# Patient Record
Sex: Female | Born: 1995 | Race: Black or African American | Hispanic: No | Marital: Single | Smoking: Never smoker
Health system: Southern US, Community
[De-identification: ages and names within clinical notes are randomized; demographics above are authoritative.]

## PROBLEM LIST (undated history)

## (undated) ENCOUNTER — Inpatient Hospital Stay (HOSPITAL_COMMUNITY): Payer: Self-pay

## (undated) ENCOUNTER — Emergency Department (HOSPITAL_COMMUNITY): Discharge status: Absent without leave | Payer: Medicaid Other

## (undated) DIAGNOSIS — A599 Trichomoniasis, unspecified: Secondary | ICD-10-CM

## (undated) DIAGNOSIS — R87619 Unspecified abnormal cytological findings in specimens from cervix uteri: Secondary | ICD-10-CM

## (undated) DIAGNOSIS — A749 Chlamydial infection, unspecified: Secondary | ICD-10-CM

## (undated) DIAGNOSIS — N390 Urinary tract infection, site not specified: Secondary | ICD-10-CM

## (undated) DIAGNOSIS — N946 Dysmenorrhea, unspecified: Secondary | ICD-10-CM

## (undated) HISTORY — PX: FOOT SURGERY: SHX648

## (undated) HISTORY — DX: Dysmenorrhea, unspecified: N94.6

## (undated) HISTORY — DX: Unspecified abnormal cytological findings in specimens from cervix uteri: R87.619

---
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
## 2016-02-07 ENCOUNTER — Emergency Department (HOSPITAL_COMMUNITY)
Admission: EM | Admit: 2016-02-07 | Discharge: 2016-02-07 | Disposition: A | Discharge status: Home / Self Care | Payer: Medicaid Other | Attending: Emergency Medicine | Admitting: Emergency Medicine

## 2016-02-07 DIAGNOSIS — N939 Abnormal uterine and vaginal bleeding, unspecified: Secondary | ICD-10-CM

## 2016-02-07 DIAGNOSIS — N938 Other specified abnormal uterine and vaginal bleeding: Secondary | ICD-10-CM

## 2016-02-07 DIAGNOSIS — Z79899 Other long term (current) drug therapy: Secondary | ICD-10-CM | POA: Insufficient documentation

## 2016-02-07 LAB — CBC WITH DIFFERENTIAL/PLATELET
BASOS ABS: 0 10*3/uL (ref 0.0–0.1)
BASOS PCT: 0 %
EOS ABS: 0.1 10*3/uL (ref 0.0–0.7)
Eosinophils Relative: 2 %
HEMATOCRIT: 38 % (ref 36.0–46.0)
HEMOGLOBIN: 12.1 g/dL (ref 12.0–15.0)
Lymphocytes Relative: 34 %
Lymphs Abs: 2.2 10*3/uL (ref 0.7–4.0)
MCH: 29.1 pg (ref 26.0–34.0)
MCHC: 31.8 g/dL (ref 30.0–36.0)
MCV: 91.3 fL (ref 78.0–100.0)
Monocytes Absolute: 0.4 10*3/uL (ref 0.1–1.0)
Monocytes Relative: 6 %
NEUTROS ABS: 3.8 10*3/uL (ref 1.7–7.7)
NEUTROS PCT: 58 %
Platelets: 296 10*3/uL (ref 150–400)
RBC: 4.16 MIL/uL (ref 3.87–5.11)
RDW: 13.4 % (ref 11.5–15.5)
WBC: 6.5 10*3/uL (ref 4.0–10.5)

## 2016-02-07 LAB — BASIC METABOLIC PANEL
ANION GAP: 5 (ref 5–15)
BUN: 12 mg/dL (ref 6–20)
CHLORIDE: 108 mmol/L (ref 101–111)
CO2: 24 mmol/L (ref 22–32)
CREATININE: 0.71 mg/dL (ref 0.44–1.00)
Calcium: 9.4 mg/dL (ref 8.9–10.3)
GFR calc non Af Amer: >60 mL/min (ref >60)
Glucose, Bld: 98 mg/dL (ref 65–99)
Potassium: 4.1 mmol/L (ref 3.5–5.1)
SODIUM: 137 mmol/L (ref 135–145)

## 2016-02-07 LAB — I-STAT BETA HCG BLOOD, ED (MC, WL, AP ONLY)

## 2016-02-07 MED ORDER — NAPROXEN 250 MG PO TABS
500.0000 mg | ORAL_TABLET | Freq: Once | ORAL | Status: AC
  Administered 2016-02-07: 500 mg via ORAL
  Filled 2016-02-07: qty 2

## 2016-02-07 MED ORDER — NAPROXEN 500 MG PO TABS: 500.0000 mg | ORAL_TABLET | Freq: Two times a day (BID) | ORAL | 0 refills | Status: DC

## 2016-02-07 NOTE — ED Provider Notes (Signed)
[facility] DEPT Provider Note   CSN: [id number] Arrival date & time: 02/07/16  [id number]  By signing my name below, I, Neta Mends, attest that this documentation has been prepared under the direction and in the presence of Tanna Furry, MD . Electronically Signed: Neta Mends, ED Scribe. 02/07/2016. 12:48 PM.    History   Chief Complaint Chief Complaint  Patient presents with  . Vaginal Bleeding   The history is provided by the patient. No language interpreter was used.   HPI Comments:  Heather Durham is a 21 y.o. female who presents to the Emergency Department complaining of persistent, heavy vaginal bleeding x 3 days. Pt reports that she had a depo-provera shot in august 2017 and has not had one since, and has not taken birth control/hormones. Pt was on depo for 2 years. She reports having heavy periods in October 2017 with associated cramping and has had the same symptoms every month since. She states that she used >10 pads every day. No alleviating factors noted. Pt denies other associated symptoms.    No past medical history on file.  There are no active problems to display for this patient.   Past Surgical History:  Procedure Laterality Date  . FOOT SURGERY Right     OB History    No data available       Home Medications    Prior to Admission medications   Medication Sig Start Date End Date Taking? Authorizing Provider  cetirizine (ZYRTEC) 10 MG chewable tablet Chew 1 tablet (10 mg total) by mouth daily. 02/21/15   Olen Cordial, NP  estradiol cypionate (DEPO-ESTRADIOL) 5 MG/ML injection Inject into the muscle every 28 (twenty-eight) days.    Historical Provider, MD  fluticasone (FLONASE) 50 MCG/ACT nasal spray Place 1 spray into both nostrils 2 (two) times daily. 02/21/15   Olen Cordial, NP  ibuprofen (ADVIL,MOTRIN) 200 MG tablet Take 200 mg by mouth every 6 (six) hours as needed.    Historical Provider, MD  naproxen (NAPROSYN) 500 MG tablet  Take 1 tablet (500 mg total) by mouth 2 (two) times daily. 02/07/16   Tanna Furry, MD  ondansetron (ZOFRAN ODT) 8 MG disintegrating tablet Take 1 tablet (8 mg total) by mouth 2 (two) times daily as needed for nausea or vomiting. 02/21/15   Olen Cordial, NP  penicillin v potassium (VEETID) 500 MG tablet Take 1 tablet (500 mg total) by mouth 2 (two) times daily. 02/22/15   Sherlene Shams, MD  predniSONE (DELTASONE) 20 MG tablet Take 2 tablets (40 mg total) by mouth daily with breakfast. 02/21/15   Olen Cordial, NP  sodium chloride (OCEAN) 0.65 % SOLN nasal spray Place 2 sprays into both nostrils every 2 (two) hours while awake. 02/21/15   Olen Cordial, NP    Family History No family history on file.  Social History Social History  Substance Use Topics  . Smoking status: Never Smoker  . Smokeless tobacco: Never Used  . Alcohol use No     Allergies   Patient has no known allergies.   Review of Systems Review of Systems  Constitutional: Negative for appetite change, chills, diaphoresis, fatigue and fever.  HENT: Negative for mouth sores, sore throat and trouble swallowing.   Eyes: Negative for visual disturbance.  Respiratory: Negative for cough, chest tightness, shortness of breath and wheezing.   Cardiovascular: Negative for chest pain.  Gastrointestinal: Negative for abdominal distention, abdominal pain, diarrhea, nausea and vomiting.  Endocrine:  Negative for polydipsia, polyphagia and polyuria.  Genitourinary: Positive for pelvic pain and vaginal bleeding. Negative for dysuria, frequency and hematuria.  Musculoskeletal: Negative for gait problem.  Skin: Negative for color change, pallor and rash.  Neurological: Negative for dizziness, syncope, light-headedness and headaches.  Hematological: Does not bruise/bleed easily.  Psychiatric/Behavioral: Negative for behavioral problems and confusion.  All other systems reviewed and are negative.    Physical Exam Updated Vital  Signs BP 118/64 (BP Location: Right Arm)   Pulse 74   Temp 98.5 F (36.9 C) (Oral)   Resp 20   SpO2 100%   Physical Exam  Constitutional: She is oriented to person, place, and time. She appears well-developed and well-nourished. No distress.  HENT:  Head: Normocephalic.  Eyes: Conjunctivae are normal. Pupils are equal, round, and reactive to light. No scleral icterus.  Neck: Normal range of motion. Neck supple. No thyromegaly present.  Cardiovascular: Normal rate and regular rhythm.  Exam reveals no gallop and no friction rub.   No murmur heard. Pulmonary/Chest: Effort normal and breath sounds normal. No respiratory distress. She has no wheezes. She has no rales.  Abdominal: Soft. Bowel sounds are normal. She exhibits no distension. There is no tenderness. There is no rebound.  Musculoskeletal: Normal range of motion.  Neurological: She is alert and oriented to person, place, and time.  Skin: Skin is warm and dry. No rash noted.  Psychiatric: She has a normal mood and affect. Her behavior is normal.     ED Treatments / Results  DIAGNOSTIC STUDIES:  Oxygen Saturation is 100% on RA, normal by my interpretation.    COORDINATION OF CARE:  12:47 PM Recommended to pt to begin seeing an OBGYN. Discussed treatment plan with pt at bedside and pt agreed to plan.   Labs (all labs ordered are listed, but only abnormal results are displayed) Labs Reviewed  CBC WITH DIFFERENTIAL/PLATELET  BASIC METABOLIC PANEL  I-STAT BETA HCG BLOOD, ED ([facility], [facility], [facility] ONLY)    EKG  EKG Interpretation None       Radiology No results found.  Procedures Procedures (including critical care time)  Medications Ordered in ED Medications  naproxen (NAPROSYN) tablet 500 mg (500 mg Oral Given 02/07/16 1254)     Initial Impression / Assessment and Plan / ED Course  I have reviewed the triage vital signs and the nursing notes.  Pertinent labs & imaging results that were available during my care of  the patient were reviewed by me and considered in my medical decision making (see chart for details).       Final Clinical Impressions(s) / ED Diagnoses   Final diagnoses:  Vaginal bleeding  Dysfunctional uterine bleeding   Stool hemoglobin. Not pregnant. No indications for additional testing at this time. GYN referral.  New Prescriptions Discharge Medication List as of 02/07/2016 12:50 PM    START taking these medications   Details  naproxen (NAPROSYN) 500 MG tablet Take 1 tablet (500 mg total) by mouth 2 (two) times daily., Starting Tue 02/07/2016, Print          Tanna Furry, MD 02/16/16 1434

## 2016-02-07 NOTE — Discharge Instructions (Signed)
You are not anemic. Neurapraxia test is negative. Your bleeding is not life-threatening.  You need to see a GYN to discuss treatment options for your bleeding including no treatment, or possible hormone treatment.  Call the number above to schedule an appointment at the [facility] hospital clinic. It is not necessary to go to the emergency room at [facility] hospital.  Naproxen as needed for uterine cramps

## 2016-02-07 NOTE — ED Triage Notes (Addendum)
Heavy vag bleeding since  Sat, states she came off depo shot in august and never went back to  Get another, she has  Issues with her period  Being very heavy since coming off, has had cramping

## 2016-06-19 ENCOUNTER — Emergency Department (HOSPITAL_COMMUNITY)
Admission: EM | Admit: 2016-06-19 | Discharge: 2016-06-20 | Disposition: A | Discharge status: Home / Self Care | Payer: Medicaid Other | Attending: Emergency Medicine | Admitting: Emergency Medicine

## 2016-06-19 ENCOUNTER — Encounter (HOSPITAL_COMMUNITY): Payer: Self-pay | Admitting: Nurse Practitioner

## 2016-06-19 DIAGNOSIS — Y929 Unspecified place or not applicable: Secondary | ICD-10-CM | POA: Insufficient documentation

## 2016-06-19 DIAGNOSIS — Z23 Encounter for immunization: Secondary | ICD-10-CM | POA: Insufficient documentation

## 2016-06-19 DIAGNOSIS — Z79899 Other long term (current) drug therapy: Secondary | ICD-10-CM | POA: Insufficient documentation

## 2016-06-19 DIAGNOSIS — Y9389 Activity, other specified: Secondary | ICD-10-CM | POA: Insufficient documentation

## 2016-06-19 DIAGNOSIS — W260XXA Contact with knife, initial encounter: Secondary | ICD-10-CM | POA: Insufficient documentation

## 2016-06-19 DIAGNOSIS — Y999 Unspecified external cause status: Secondary | ICD-10-CM | POA: Insufficient documentation

## 2016-06-19 DIAGNOSIS — S61211A Laceration without foreign body of left index finger without damage to nail, initial encounter: Secondary | ICD-10-CM | POA: Insufficient documentation

## 2016-06-19 MED ORDER — LIDOCAINE HCL 2 % IJ SOLN
5.0000 mL | Freq: Once | INTRAMUSCULAR | Status: AC
  Administered 2016-06-19: 100 mg
  Filled 2016-06-19: qty 20

## 2016-06-19 MED ORDER — IBUPROFEN 800 MG PO TABS
800.0000 mg | ORAL_TABLET | Freq: Once | ORAL | Status: AC
  Administered 2016-06-19: 800 mg via ORAL
  Filled 2016-06-19: qty 1

## 2016-06-19 MED ORDER — TETANUS-DIPHTH-ACELL PERTUSSIS 5-2.5-18.5 LF-MCG/0.5 IM SUSP
0.5000 mL | Freq: Once | INTRAMUSCULAR | Status: AC
  Administered 2016-06-19: 0.5 mL via INTRAMUSCULAR
  Filled 2016-06-19: qty 0.5

## 2016-06-19 NOTE — ED Provider Notes (Signed)
[facility] DEPT Provider Note   CSN: [id number] Arrival date & time: 06/19/16  1842  By signing my name below, I, Ephriam Jenkins, attest that this documentation has been prepared under the direction and in the presence of Aanchal Cope PA-C.  Electronically Signed: Ephriam Jenkins, ED Scribe. 06/19/16. 11:51 PM.  History   Chief Complaint Chief Complaint  Patient presents with  . Finger Injury    HPI HPI Comments: Heather Durham is a 21 y.o. female who presents to the Emergency Department s/p an injury that occurred around 1700 this evening. Pt was attempting to cut open a bag of Mikey Kirschner when the knife broke through the bag and cut her left second digit. The pt is right hand dominant. Pt has a small, hemostatic laceration on the distal portion of the finger. She rates her pain a 7/10 currently. The bleeding is currently controlled by a bandage. No OTC medications tried prior to arrival. She is unsure of her last Tetanus. No numbness or tingling.   The history is provided by the patient. No language interpreter was used.    History reviewed. No pertinent past medical history.  There are no active problems to display for this patient.  Past Surgical History:  Procedure Laterality Date  . FOOT SURGERY Right    OB History    No data available      Home Medications    Prior to Admission medications   Medication Sig Start Date End Date Taking? Authorizing Provider  cetirizine (ZYRTEC) 10 MG chewable tablet Chew 1 tablet (10 mg total) by mouth daily. 02/21/15   Betancourt, Aura Fey, NP  estradiol cypionate (DEPO-ESTRADIOL) 5 MG/ML injection Inject into the muscle every 28 (twenty-eight) days.    [provider]  fluticasone (FLONASE) 50 MCG/ACT nasal spray Place 1 spray into both nostrils 2 (two) times daily. 02/21/15   Betancourt, Aura Fey, NP  ibuprofen (ADVIL,MOTRIN) 200 MG tablet Take 200 mg by mouth every 6 (six) hours as needed.    [provider]  naproxen  (NAPROSYN) 500 MG tablet Take 1 tablet (500 mg total) by mouth 2 (two) times daily. 02/07/16   Tanna Furry, MD  ondansetron (ZOFRAN ODT) 8 MG disintegrating tablet Take 1 tablet (8 mg total) by mouth 2 (two) times daily as needed for nausea or vomiting. 02/21/15   Betancourt, Aura Fey, NP  penicillin v potassium (VEETID) 500 MG tablet Take 1 tablet (500 mg total) by mouth 2 (two) times daily. 02/22/15   Sherlene Shams, MD  predniSONE (DELTASONE) 20 MG tablet Take 2 tablets (40 mg total) by mouth daily with breakfast. 02/21/15   Betancourt, Aura Fey, NP  sodium chloride (OCEAN) 0.65 % SOLN nasal spray Place 2 sprays into both nostrils every 2 (two) hours while awake. 02/21/15   Betancourt, Aura Fey, NP   Family History History reviewed. No pertinent family history.  Social History Social History  Substance Use Topics  . Smoking status: Never Smoker  . Smokeless tobacco: Never Used  . Alcohol use No    Allergies   Patient has no known allergies.   Review of Systems Review of Systems  Constitutional: Negative for activity change.  Respiratory: Negative for shortness of breath.   Cardiovascular: Negative for chest pain.  Gastrointestinal: Negative for abdominal pain.  Musculoskeletal: Negative for back pain.  Skin: Positive for wound (laceration to left second digit). Negative for rash.  Neurological: Negative for numbness.    Physical Exam Updated Vital Signs BP 127/84 (  BP Location: Right Arm)   Pulse (!) 106   Temp 99.4 F (37.4 C) (Oral)   Resp 19   SpO2 98%   Physical Exam  Constitutional: No distress.  HENT:  Head: Normocephalic.  Eyes: Conjunctivae are normal.  Neck: Neck supple.  Cardiovascular: Normal rate, regular rhythm, normal heart sounds and intact distal pulses.  Exam reveals no gallop and no friction rub.   No murmur heard. Pulmonary/Chest: Effort normal and breath sounds normal. No respiratory distress. She has no rales. She exhibits no tenderness.  Abdominal: Soft.  She exhibits no distension.  Neurological: She is alert.  Skin: Skin is warm. No rash noted.  There is a 1 cm laceration distal to the DIP joint on the second digit of the left hand. NVI. Radial pulses 2+ FROM of all five digits and the left wrist.  Psychiatric: Her behavior is normal.  Nursing note and vitals reviewed.  ED Treatments / Results  DIAGNOSTIC STUDIES: Oxygen Saturation is 98% on RA, normal by my interpretation.  COORDINATION  OF CARE: 11:04 PM-Will suture hand. Discussed treatment plan with pt at bedside and pt agreed to plan.   Labs (all labs ordered are listed, but only abnormal results are displayed) Labs Reviewed - No data to display  EKG  EKG Interpretation None       Radiology No results found.  Procedures .Marland KitchenLaceration Repair Date/Time: 06/19/2016 11:54 PM Performed by: Sherryle Lis, Apolonia Ellwood A Authorized by: Joline Maxcy A   Consent:    Consent obtained:  Verbal   Consent given by:  Patient Laceration details:    Location:  Finger   Finger location:  L index finger   Length (cm):  1 Repair type:    Repair type:  Simple Pre-procedure details:    Preparation:  Patient was prepped and draped in usual sterile fashion and imaging obtained to evaluate for foreign bodies Exploration:    Hemostasis achieved with:  Direct pressure   Wound exploration: wound explored through full range of motion and entire depth of wound probed and visualized     Contaminated: no   Treatment:    Area cleansed with:  Saline   Irrigation solution:  Sterile saline   Irrigation method:  Pressure wash Skin repair:    Repair method:  Sutures   Suture size:  4-0   Suture material:  Prolene   Number of sutures:  2 Approximation:    Approximation:  Close   Vermilion border: well-aligned   Post-procedure details:    Dressing:  Splint for protection   Patient tolerance of procedure:  Tolerated well, no immediate complications    (including critical care time)  Medications  Ordered in ED Medications  lidocaine (XYLOCAINE) 2 % (with pres) injection 100 mg (100 mg Infiltration Given 06/19/16 2327)  Tdap (BOOSTRIX) injection 0.5 mL (0.5 mLs Intramuscular Given 06/19/16 2327)  ibuprofen (ADVIL,MOTRIN) tablet 800 mg (800 mg Oral Given 06/19/16 2327)     Initial Impression / Assessment and Plan / ED Course  I have reviewed the triage vital signs and the nursing notes.  Pertinent labs & imaging results that were available during my care of the patient were reviewed by me and considered in my medical decision making (see chart for details).     Pressure irrigation performed. Wound explored and base of wound visualized in a bloodless field without evidence of foreign body.  Laceration occurred < 8 hours prior to repair which was well tolerated. Tdap updated.  Pt has no comorbidities  to effect normal wound healing. Pt discharged without antibiotics.  Discussed suture home care with patient and answered questions. Pt to follow-up for wound check and suture removal in 7 days; they are to return to the ED sooner for signs of infection. Pt is hemodynamically stable with no complaints prior to dc.   Final Clinical Impressions(s) / ED Diagnoses   Final diagnoses:  Laceration of left index finger without foreign body without damage to nail, initial encounter    New Prescriptions New Prescriptions   No medications on file  I personally performed the services described in this documentation, which was scribed in my presence. The recorded information has been reviewed and is accurate.     Joanne Gavel, PA-C 06/20/16 Richrd Humbles, MD 06/25/16 [phone]

## 2016-06-19 NOTE — ED Triage Notes (Signed)
Pt presents with c/o finger injury. she has a laceration to anterior tip of Left second digit with knife while cutting a bag. Minimal bleeding. Pain at site.

## 2016-06-20 NOTE — Discharge Instructions (Signed)
Please wear your finger splint to avoid ripping your stitches. Your stitches can come out in 7 days. You can return to the emergency department or have your primary care provider take them out. Please keep the wound clean with warm soap and water. If he develops any additional symptoms such as fever, chills, warmth, redness, or severe swelling to the finger, please return to the emergency department for re-evaluation.

## 2016-06-25 ENCOUNTER — Encounter (HOSPITAL_COMMUNITY): Payer: Self-pay | Admitting: Emergency Medicine

## 2016-06-25 ENCOUNTER — Emergency Department (HOSPITAL_COMMUNITY)
Admission: EM | Admit: 2016-06-25 | Discharge: 2016-06-25 | Disposition: A | Discharge status: Home / Self Care | Payer: Medicaid Other | Attending: Emergency Medicine | Admitting: Emergency Medicine

## 2016-06-25 DIAGNOSIS — Z79899 Other long term (current) drug therapy: Secondary | ICD-10-CM | POA: Insufficient documentation

## 2016-06-25 DIAGNOSIS — Z4802 Encounter for removal of sutures: Secondary | ICD-10-CM

## 2016-06-25 NOTE — ED Notes (Signed)
Placed bacitracin and dressing on finger

## 2016-06-25 NOTE — ED Triage Notes (Signed)
Pt returns to have sutures removed from left index finger.  No complaints voiced

## 2016-06-25 NOTE — ED Provider Notes (Signed)
[facility] DEPT Provider Note   CSN: [id number] Arrival date & time: 06/25/16  1801  By signing my name below, I, Hansel Feinstein, attest that this documentation has been prepared under the direction and in the presence of  [place]. Janit Bern, NP. Electronically Signed: Hansel Feinstein, ED Scribe. 06/25/16. 6:58 PM.    History   Chief Complaint Chief Complaint  Patient presents with  . Suture / Staple Removal    HPI Heather Durham is a 21 y.o. female who presents to the Emergency Department requesting suture removal from the left index finger, initially placed on 06/19/16. Per chart review, pt sustained the injury cutting a bag with a kitchen knife and had 2 sutures placed. She states the wound has healed well and denies any drainage. She does note the area has been mildly painful with touch. She also denies fever.   The history is provided by the patient. No language interpreter was used.  Suture / Staple Removal  This is a new problem. The current episode started more than 1 week ago. The problem occurs constantly. The problem has been rapidly improving. Exacerbated by: touch. Treatments tried: sutures. The treatment provided significant relief.    History reviewed. No pertinent past medical history.  There are no active problems to display for this patient.   Past Surgical History:  Procedure Laterality Date  . FOOT SURGERY Right     OB History    No data available       Home Medications    Prior to Admission medications   Medication Sig Start Date End Date Taking? Authorizing Provider  cetirizine (ZYRTEC) 10 MG chewable tablet Chew 1 tablet (10 mg total) by mouth daily. 02/21/15   Betancourt, Aura Fey, NP  estradiol cypionate (DEPO-ESTRADIOL) 5 MG/ML injection Inject into the muscle every 28 (twenty-eight) days.    [provider]  fluticasone (FLONASE) 50 MCG/ACT nasal spray Place 1 spray into both nostrils 2 (two) times daily. 02/21/15   Betancourt, Aura Fey, NP    ibuprofen (ADVIL,MOTRIN) 200 MG tablet Take 200 mg by mouth every 6 (six) hours as needed.    [provider]  naproxen (NAPROSYN) 500 MG tablet Take 1 tablet (500 mg total) by mouth 2 (two) times daily. 02/07/16   Tanna Furry, MD  ondansetron (ZOFRAN ODT) 8 MG disintegrating tablet Take 1 tablet (8 mg total) by mouth 2 (two) times daily as needed for nausea or vomiting. 02/21/15   Betancourt, Aura Fey, NP  penicillin v potassium (VEETID) 500 MG tablet Take 1 tablet (500 mg total) by mouth 2 (two) times daily. 02/22/15   Sherlene Shams, MD  predniSONE (DELTASONE) 20 MG tablet Take 2 tablets (40 mg total) by mouth daily with breakfast. 02/21/15   Betancourt, Aura Fey, NP  sodium chloride (OCEAN) 0.65 % SOLN nasal spray Place 2 sprays into both nostrils every 2 (two) hours while awake. 02/21/15   Betancourt, Aura Fey, NP    Family History No family history on file.  Social History Social History  Substance Use Topics  . Smoking status: Never Smoker  . Smokeless tobacco: Never Used  . Alcohol use No     Allergies   Patient has no known allergies.   Review of Systems Review of Systems  Constitutional: Negative for fever.  Gastrointestinal: Negative for nausea.  Skin: Positive for wound.     Physical Exam Updated Vital Signs BP 120/66   Temp 98.4 F (36.9 C) (Oral)   Resp 17  Ht 5\' 8"  (1.727 m)   Wt 103.1 kg (227 lb 6 oz)   SpO2 98%   BMI 34.57 kg/m   Physical Exam  Constitutional: She appears well-developed and well-nourished. No distress.  HENT:  Head: Normocephalic.  Eyes: EOM are normal.  Neck: Neck supple.  Cardiovascular: Normal rate.   Pulmonary/Chest: Effort normal.  Musculoskeletal: Normal range of motion.  Neurological: She is alert.  Skin: Skin is warm and dry.  Healing wound to the left index finger palmar aspect. No redness, drainage or signs of infection. FROM of the finger. 2 sutures in place.   Psychiatric: She has a normal mood and affect. Her  behavior is normal.  Nursing note and vitals reviewed.    ED Treatments / Results   DIAGNOSTIC STUDIES: Oxygen Saturation is 98% on RA, normal by my interpretation.    COORDINATION OF CARE: 6:55 PM Discussed treatment plan with pt at bedside which includes suture removal and pt agreed to plan.   Procedures .Suture Removal Date/Time: 06/25/2016 6:54 PM Performed by: Ashley Murrain Authorized by: Ashley Murrain   Consent:    Consent obtained:  Verbal   Consent given by:  Patient   Risks discussed:  Bleeding, pain and wound separation   Alternatives discussed:  No treatment Universal protocol:    Procedure explained and questions answered to patient or proxy's satisfaction: yes     Immediately prior to procedure, a time out was called: yes     Patient identity confirmed:  Verbally with patient and arm band Location:    Location:  Upper extremity   Upper extremity location:  Hand   Hand location:  L index finger Procedure details:    Wound appearance:  No signs of infection, good wound healing and clean   Number of sutures removed:  2 Post-procedure details:    Post-removal:  Antibiotic ointment applied   Patient tolerance of procedure:  Tolerated well, no immediate complications   (including critical care time)  Medications Ordered in ED Medications - No data to display   Initial Impression / Assessment and Plan / ED Course  I have reviewed the triage vital signs and the nursing notes.   Suture removal   Pt to ER for suture removal and wound check as above. Procedure tolerated well. Vitals normal, no signs of infection. Scar minimization & return precautions given at dc.    Final Clinical Impressions(s) / ED Diagnoses   Final diagnoses:  Visit for suture removal    New Prescriptions New Prescriptions   No medications on file    I personally performed the services described in this documentation, which was scribed in my presence. The recorded information has  been reviewed and is accurate.     Debroah Baller [place], [place] 06/25/16 1919    Dorie Rank, MD 06/27/16 [phone]

## 2016-09-06 ENCOUNTER — Ambulatory Visit (HOSPITAL_COMMUNITY)
Admission: EM | Admit: 2016-09-06 | Discharge: 2016-09-06 | Disposition: A | Discharge status: Home / Self Care | Payer: Medicaid Other | Attending: Emergency Medicine | Admitting: Emergency Medicine

## 2016-09-06 ENCOUNTER — Encounter (HOSPITAL_COMMUNITY): Payer: Self-pay | Admitting: Nurse Practitioner

## 2016-09-06 DIAGNOSIS — O3680X0 Pregnancy with inconclusive fetal viability, not applicable or unspecified: Secondary | ICD-10-CM | POA: Insufficient documentation

## 2016-09-06 DIAGNOSIS — O26891 Other specified pregnancy related conditions, first trimester: Secondary | ICD-10-CM | POA: Insufficient documentation

## 2016-09-06 DIAGNOSIS — R110 Nausea: Secondary | ICD-10-CM | POA: Insufficient documentation

## 2016-09-06 DIAGNOSIS — Z3A01 Less than 8 weeks gestation of pregnancy: Secondary | ICD-10-CM | POA: Insufficient documentation

## 2016-09-06 DIAGNOSIS — O26899 Other specified pregnancy related conditions, unspecified trimester: Secondary | ICD-10-CM

## 2016-09-06 DIAGNOSIS — R319 Hematuria, unspecified: Secondary | ICD-10-CM

## 2016-09-06 DIAGNOSIS — R102 Pelvic and perineal pain: Secondary | ICD-10-CM | POA: Insufficient documentation

## 2016-09-06 DIAGNOSIS — Z3201 Encounter for pregnancy test, result positive: Secondary | ICD-10-CM

## 2016-09-06 DIAGNOSIS — N390 Urinary tract infection, site not specified: Secondary | ICD-10-CM

## 2016-09-06 DIAGNOSIS — O2341 Unspecified infection of urinary tract in pregnancy, first trimester: Secondary | ICD-10-CM | POA: Insufficient documentation

## 2016-09-06 LAB — POCT URINALYSIS DIP (DEVICE)
BILIRUBIN URINE: NEGATIVE
GLUCOSE, UA: NEGATIVE mg/dL
KETONES UR: NEGATIVE mg/dL
Nitrite: NEGATIVE
PROTEIN: 30 mg/dL — AB
Specific Gravity, Urine: >=1.03 (ref 1.005–1.030)
Urobilinogen, UA: 1 mg/dL (ref 0.0–1.0)
pH: 6.5 (ref 5.0–8.0)

## 2016-09-06 LAB — POCT PREGNANCY, URINE: PREG TEST UR: POSITIVE — AB

## 2016-09-06 MED ORDER — PRENATAL VITAMIN 27-0.8 MG PO TABS: 1.0000 | ORAL_TABLET | Freq: Every day | ORAL | 1 refills | Status: DC

## 2016-09-06 MED ORDER — NITROFURANTOIN MONOHYD MACRO 100 MG PO CAPS: 100.0000 mg | ORAL_CAPSULE | Freq: Two times a day (BID) | ORAL | 0 refills | Status: AC

## 2016-09-06 NOTE — ED Triage Notes (Signed)
Pt presents with c/o pelvic pain. The pain began about one week ago. The pain feels like menstrual cramps but she denies any vaginal bleeding. Her last normal menstrual period was on 7/12. She reports a history of irregular periods. She has not taken anything at home for the pain.

## 2016-09-06 NOTE — Discharge Instructions (Signed)
Finish the [employer]. Start the prenatal vitamins. Give [place] a working phone number so that we can contact you if we need to change any of prescriptions. Follow-up with anyone of the OB/GYNs listed above. Go immediately to [facility] on [facility] for any of the signs and symptoms that we discussed such as vaginal bleeding, regular cramping, pelvic pain on one side or the other, or if you pass out.

## 2016-09-06 NOTE — ED Provider Notes (Addendum)
HPI  SUBJECTIVE:  Heather Durham is a 21 y.o. female who presents with 1.5 weeks of irregular nonradiating nonmigratory low midline pelvic pain described as cramping. It lasts 10-15 seconds and then resolves on its own. She reports vaginal bleeding described as spotting 4 days ago, but this has since resolved. She reports nausea and sensitivity to smells. She denies vaginal odor, discharge, rash, itching. No vomiting, fevers, back pain. No breast tenderness. No dysuria, urgency, frequency, cloudy or odorous urine, hematuria. No dyspareunia. She has been using a heating pad with some improvement of symptoms. No aggravating factors. She has never had symptoms like this before. She is in a new monogamous relationship of 2 months with a female who is asymptomatic.  STDs are not a concern today. However, she states that she was treated 2 weeks ago for chlamydia from another partner with whom she has not had contact with since starting this new relationship. States that she was checked for gonorrhea, HIV, HSV, syphilis, Trichomonas, BV yeast and all these were negative. She has a past medical history of chlamydia. no history of gonorrhea, HIV, HSV, syphilis, Trichomonas, BV, yeast. She has never been pregnant before. No history of PID, diabetes, UTI, pyelonephritis. LMP: 7/12. She thinks that she may have had a positive home pregnancy test but is not sure. FIE:PPIRJJ, Ngwe A, MD       No past medical history on file.  Past Surgical History:  Procedure Laterality Date  . FOOT SURGERY Right     No family history on file.  Social History  Substance Use Topics  . Smoking status: Never Smoker  . Smokeless tobacco: Never Used  . Alcohol use No    No current facility-administered medications for this encounter.   Current Outpatient Prescriptions:  .  fluticasone (FLONASE) 50 MCG/ACT nasal spray, Place 1 spray into both nostrils 2 (two) times daily., Disp: 16 g, Rfl: 0 .  nitrofurantoin,  macrocrystal-monohydrate, (MACROBID) 100 MG capsule, Take 1 capsule (100 mg total) by mouth 2 (two) times daily., Disp: 14 capsule, Rfl: 0 .  Prenatal Vit-Fe Fumarate-FA (PRENATAL VITAMIN) 27-0.8 MG TABS, Take 1 tablet by mouth daily., Disp: 30 tablet, Rfl: 1 .  sodium chloride (OCEAN) 0.65 % SOLN nasal spray, Place 2 sprays into both nostrils every 2 (two) hours while awake., Disp: , Rfl: 0  No Known Allergies   ROS  As noted in HPI.   Physical Exam  BP 132/74   Pulse 89   Temp 98.6 F (37 C) (Oral)   Resp 16   SpO2 100%   Constitutional: Well developed, well nourished, no acute distress Eyes:  EOMI, conjunctiva normal bilaterally HENT: Normocephalic, atraumatic,mucus membranes moist Respiratory: Normal inspiratory effort Cardiovascular: Normal rate GI: nondistended soft, nontender. No suprapubic tenderness  back: No CVA tenderness GU: External genitalia normal.  Normal vaginal mucosa.  Normal os. Scant nonoderous  White vaginal discharge.   Uterus smooth,  NT. No  CMT. Noadnexal tenderness. No adnexal masses.  Chaperone present during exam skin: No rash, skin intact Musculoskeletal: no deformities Neurologic: Alert & oriented x 3, no focal neuro deficits Psychiatric: Speech and behavior appropriate   ED Course   Medications - No data to display  Orders Placed This Encounter  Procedures  . Pelvic exam    Standing Status:   Standing    Number of Occurrences:   1  . Urine culture    Standing Status:   Standing    Number of Occurrences:   1  Order Specific Question:   List patient's active antibiotics    Answer:   macrobid    Order Specific Question:   Patient immune status    Answer:   Normal  . POCT urinalysis dip (device)    Standing Status:   Standing    Number of Occurrences:   1  . Pregnancy, urine POC    Standing Status:   Standing    Number of Occurrences:   1    Results for orders placed or performed during the hospital encounter of 09/06/16 (from  the past 24 hour(s))  POCT urinalysis dip (device)     Status: Abnormal   Collection Time: 09/06/16  7:11 PM  Result Value Ref Range   Glucose, UA NEGATIVE NEGATIVE mg/dL   Bilirubin Urine NEGATIVE NEGATIVE   Ketones, ur NEGATIVE NEGATIVE mg/dL   Specific Gravity, Urine >=1.030 1.005 - 1.030   Hgb urine dipstick TRACE (A) NEGATIVE   pH 6.5 5.0 - 8.0   Protein, ur 30 (A) NEGATIVE mg/dL   Urobilinogen, UA 1.0 0.0 - 1.0 mg/dL   Nitrite NEGATIVE NEGATIVE   Leukocytes, UA LARGE (A) NEGATIVE  Pregnancy, urine POC     Status: Abnormal   Collection Time: 09/06/16  7:19 PM  Result Value Ref Range   Preg Test, Ur POSITIVE (A) NEGATIVE   No results found.  ED Clinical Impression  Pregnancy of unknown anatomic location  Pelvic pain during pregnancy  Urinary tract infection with hematuria, site unspecified   ED Assessment/Plan  Patient is pregnant. Estimated gestational age by LMP 6 weeks 0 days. Sending home with prenatal vitamins.   No evidence of PID. No evidence of ectopic pregnancy at this time.  She also has a UTI. Sending her home with Macrobid 7 days. Sending urine off for culture to confirm antibiotic choice.  She has no other urinary symptoms so deferring Pyridium. Discussed with her the possibility of treating empirically for gonorrhea and chlamydia here today, but she states that since she was recently treated for chlamydia she would rather wait for her labs. She is to give [place] a working phone number so that we can contact her if needed. Advised her that she will need to bring her partner in for treatment if her gonorrhea or chlamydia come back positive. Also, will wait for labs prior to initiating treatment for BV or yeast since she has no odor or discharge.  Pt provided working phone number. Follow-up with OB/GYN of choice for next steps. Providing comprehensive list of OB/GYN providers on call. as needed. Discussed labs, MDM, plan and followup with patient. discussed Signs and  symptoms that should prompt return to the emergency department at [facility]. Pt agrees with plan.   Meds ordered this encounter  Medications  . nitrofurantoin, macrocrystal-monohydrate, (MACROBID) 100 MG capsule    Sig: Take 1 capsule (100 mg total) by mouth 2 (two) times daily.    Dispense:  14 capsule    Refill:  0  . Prenatal Vit-Fe Fumarate-FA (PRENATAL VITAMIN) 27-0.8 MG TABS    Sig: Take 1 tablet by mouth daily.    Dispense:  30 tablet    Refill:  1    *This clinic note was created using L[employer]. Therefore, there may be occasional mistakes despite careful proofreading.  ?1633 09/07/2016-   after consulting with a midwife colleague, feel that the safest course of action would be to have pt go to MAU today to have labwork and possible u/s to confirm  pregnancy location.  Called pt. Pt doing better today and has no pelvic pain. Is taking the macrobid. Recommended that she go to MAU today for further workup and explained why. Pt agrees to go right now.     Melynda Ripple, MD 09/06/16 2123    Melynda Ripple, MD 09/06/16 2128    Melynda Ripple, MD 09/07/16 [phone]

## 2016-09-06 NOTE — ED Notes (Signed)
Called [facility] and requested they [facility] [place] [facility].  Caryl Pina at [facility] pharmacy agreed.  meds being prepared

## 2016-09-07 ENCOUNTER — Inpatient Hospital Stay (HOSPITAL_COMMUNITY): Payer: Self-pay

## 2016-09-07 ENCOUNTER — Inpatient Hospital Stay (HOSPITAL_COMMUNITY)
Admission: AD | Admit: 2016-09-07 | Discharge: 2016-09-07 | Disposition: A | Discharge status: Home / Self Care | Payer: Self-pay | Source: Ambulatory Visit | Attending: Obstetrics and Gynecology | Admitting: Obstetrics and Gynecology

## 2016-09-07 ENCOUNTER — Telehealth (HOSPITAL_COMMUNITY): Payer: Self-pay | Admitting: Internal Medicine

## 2016-09-07 ENCOUNTER — Encounter (HOSPITAL_COMMUNITY): Payer: Self-pay | Admitting: *Deleted

## 2016-09-07 DIAGNOSIS — R109 Unspecified abdominal pain: Secondary | ICD-10-CM | POA: Insufficient documentation

## 2016-09-07 DIAGNOSIS — Z9889 Other specified postprocedural states: Secondary | ICD-10-CM | POA: Insufficient documentation

## 2016-09-07 DIAGNOSIS — Z3201 Encounter for pregnancy test, result positive: Secondary | ICD-10-CM | POA: Insufficient documentation

## 2016-09-07 DIAGNOSIS — Z3A01 Less than 8 weeks gestation of pregnancy: Secondary | ICD-10-CM | POA: Insufficient documentation

## 2016-09-07 DIAGNOSIS — O208 Other hemorrhage in early pregnancy: Secondary | ICD-10-CM | POA: Insufficient documentation

## 2016-09-07 DIAGNOSIS — O26891 Other specified pregnancy related conditions, first trimester: Secondary | ICD-10-CM | POA: Insufficient documentation

## 2016-09-07 DIAGNOSIS — Z3491 Encounter for supervision of normal pregnancy, unspecified, first trimester: Secondary | ICD-10-CM

## 2016-09-07 DIAGNOSIS — O9989 Other specified diseases and conditions complicating pregnancy, childbirth and the puerperium: Secondary | ICD-10-CM

## 2016-09-07 LAB — URINALYSIS, ROUTINE W REFLEX MICROSCOPIC
Bilirubin Urine: NEGATIVE
Glucose, UA: NEGATIVE mg/dL
Hgb urine dipstick: NEGATIVE
Ketones, ur: NEGATIVE mg/dL
Nitrite: NEGATIVE
Protein, ur: NEGATIVE mg/dL
Specific Gravity, Urine: 1.019 (ref 1.005–1.030)
pH: 7 (ref 5.0–8.0)

## 2016-09-07 LAB — CERVICOVAGINAL ANCILLARY ONLY
Bacterial vaginitis: POSITIVE — AB
Candida vaginitis: NEGATIVE
Chlamydia: POSITIVE — AB
Neisseria Gonorrhea: NEGATIVE
Trichomonas: POSITIVE — AB

## 2016-09-07 LAB — ABO/RH: ABO/RH(D): O POS

## 2016-09-07 LAB — CBC
HCT: 33.9 % — ABNORMAL LOW (ref 36.0–46.0)
HEMOGLOBIN: 11.1 g/dL — AB (ref 12.0–15.0)
MCH: 29.8 pg (ref 26.0–34.0)
MCHC: 32.7 g/dL (ref 30.0–36.0)
MCV: 90.9 fL (ref 78.0–100.0)
Platelets: 287 10*3/uL (ref 150–400)
RBC: 3.73 MIL/uL — ABNORMAL LOW (ref 3.87–5.11)
RDW: 13.3 % (ref 11.5–15.5)
WBC: 7.3 10*3/uL (ref 4.0–10.5)

## 2016-09-07 LAB — HCG, QUANTITATIVE, PREGNANCY: hCG, Beta Chain, Quant, S: 15535 m[IU]/mL — ABNORMAL HIGH (ref <5)

## 2016-09-07 MED ORDER — ONDANSETRON HCL 4 MG PO TABS: 8.0000 mg | ORAL_TABLET | ORAL | 0 refills | Status: DC | PRN

## 2016-09-07 MED ORDER — METRONIDAZOLE 500 MG PO TABS: 2000.0000 mg | ORAL_TABLET | Freq: Once | ORAL | 0 refills | Status: AC

## 2016-09-07 MED ORDER — PROMETHAZINE HCL 25 MG PO TABS: 12.5000 mg | ORAL_TABLET | Freq: Four times a day (QID) | ORAL | 2 refills | Status: DC | PRN

## 2016-09-07 MED ORDER — AZITHROMYCIN 500 MG PO TABS: 1000.0000 mg | ORAL_TABLET | Freq: Once | ORAL | 0 refills | Status: AC

## 2016-09-07 NOTE — MAU Note (Signed)
Pt presents to MAU with complaints of lower abdominal pain for approximately a week, went to urgent care yesterday and was told she was pregnant and to follow up here if the pain continued.Denies any VB or abnormal discharge

## 2016-09-07 NOTE — Discharge Instructions (Signed)
[facility]iders  ° ° °[facility]       Phone: [phone] ° °[facility] Phone: [phone] ° °[facility]  Phone: [phone] ° °[facility] [place]  Phone: [phone] ° °[facility]  Phone: [phone] ° °[facility]       Phone: [phone] ° °[facility]    Phone: [phone]  ° °[facility]    Phone: [phone] ° °[facility]    Phone: [phone] ° °[facility]    Phone: [phone] ° °[facility]    Phone: [phone] ° °[facility]       Phone: [phone]  ° °[facility]  Phone: [phone] ° °[facility]    Phone: [phone] ° °[facility]   Phone: [phone] ° °[facility]      Phone: [phone] ° °[facility]    Phone: [phone] ° °

## 2016-09-07 NOTE — MAU Note (Signed)
Urine sent to lab 

## 2016-09-07 NOTE — MAU Provider Note (Signed)
Chief Complaint: Abdominal Pain   First Provider Initiated Contact with Patient 09/07/16 1830      SUBJECTIVE HPI: Heather Durham is a 21 y.o. G1P0 at [redacted]w[redacted]d by LMP who presents to maternity admissions reporting abdominal cramping x 2 days with positive pregnancy test at urgent care yesterday. She reports the pain is sharp, intermittent, on her right and left lower sides and unchanged in intensity since onset.  She has not tried any treatments. There are no other associated symptoms. She denies vaginal bleeding, vaginal itching/burning, urinary symptoms, h/a, dizziness, n/v, or fever/chills.     HPI  No past medical history on file. Past Surgical History:  Procedure Laterality Date  . FOOT SURGERY Right    Social History   Social History  . Marital status: Single    Spouse name: N/A  . Number of children: N/A  . Years of education: N/A   Occupational History  . Not on file.   Social History Main Topics  . Smoking status: Never Smoker  . Smokeless tobacco: Never Used  . Alcohol use No  . Drug use: No  . Sexual activity: Yes    Birth control/ protection: None   Other Topics Concern  . Not on file   Social History Narrative  . No narrative on file   No current facility-administered medications on file prior to encounter.    Current Outpatient Prescriptions on File Prior to Encounter  Medication Sig Dispense Refill  . nitrofurantoin, macrocrystal-monohydrate, (MACROBID) 100 MG capsule Take 1 capsule (100 mg total) by mouth 2 (two) times daily. 14 capsule 0  . Prenatal Vit-Fe Fumarate-FA (PRENATAL VITAMIN) 27-0.8 MG TABS Take 1 tablet by mouth daily. 30 tablet 1  . azithromycin (ZITHROMAX) 500 MG tablet Take 2 tablets (1,000 mg total) by mouth once. (Patient not taking: Reported on 09/07/2016) 2 tablet 0  . metroNIDAZOLE (FLAGYL) 500 MG tablet Take 4 tablets (2,000 mg total) by mouth once. (Patient not taking: Reported on 09/07/2016) 4 tablet 0  . ondansetron (ZOFRAN) 4 MG  tablet Take 2 tablets (8 mg total) by mouth every 4 (four) hours as needed for nausea or vomiting. (Patient not taking: Reported on 09/07/2016) 5 tablet 0   No Known Allergies  ROS:  Review of Systems  Constitutional: Negative for chills, fatigue and fever.  Respiratory: Negative for shortness of breath.   Cardiovascular: Negative for chest pain.  Gastrointestinal: Positive for abdominal pain.  Genitourinary: Positive for pelvic pain. Negative for difficulty urinating, dysuria, flank pain, vaginal bleeding, vaginal discharge and vaginal pain.  Neurological: Negative for dizziness and headaches.  Psychiatric/Behavioral: Negative.      I have reviewed patient's Past Medical Hx, Surgical Hx, Family Hx, Social Hx, medications and allergies.   Physical Exam   Patient Vitals for the past 24 hrs:  BP Temp Pulse Resp Height Weight  09/07/16 1753 133/84 98.2 F (36.8 C) 92 18 5\' 8"  (1.727 m) 221 lb (100.2 kg)   Constitutional: Well-developed, well-nourished female in no acute distress.  Cardiovascular: normal rate Respiratory: normal effort GI: Abd soft, non-tender. Pos BS x 4 MS: Extremities nontender, no edema, normal ROM Neurologic: Alert and oriented x 4.  GU: Neg CVAT.  PELVIC EXAM: Deferred--done at Urgent care with wet prep/GC yesterdat   LAB RESULTS Results for orders placed or performed during the hospital encounter of 09/07/16 (from the past 24 hour(s))  Urinalysis, Routine w reflex microscopic     Status: Abnormal   Collection Time: 09/07/16  6:15 PM  Result Value Ref Range   Color, Urine YELLOW YELLOW   APPearance HAZY (A) CLEAR   Specific Gravity, Urine 1.019 1.005 - 1.030   pH 7.0 5.0 - 8.0   Glucose, UA NEGATIVE NEGATIVE mg/dL   Hgb urine dipstick NEGATIVE NEGATIVE   Bilirubin Urine NEGATIVE NEGATIVE   Ketones, ur NEGATIVE NEGATIVE mg/dL   Protein, ur NEGATIVE NEGATIVE mg/dL   Nitrite NEGATIVE NEGATIVE   Leukocytes, UA LARGE (A) NEGATIVE   RBC / HPF 6-30 0 - 5  RBC/hpf   WBC, UA TOO NUMEROUS TO COUNT 0 - 5 WBC/hpf   Bacteria, UA RARE (A) NONE SEEN   Squamous Epithelial / LPF 6-30 (A) NONE SEEN   Mucus PRESENT   CBC     Status: Abnormal   Collection Time: 09/07/16  6:46 PM  Result Value Ref Range   WBC 7.3 4.0 - 10.5 K/uL   RBC 3.73 (L) 3.87 - 5.11 MIL/uL   Hemoglobin 11.1 (L) 12.0 - 15.0 g/dL   HCT 33.9 (L) 36.0 - 46.0 %   MCV 90.9 78.0 - 100.0 fL   MCH 29.8 26.0 - 34.0 pg   MCHC 32.7 30.0 - 36.0 g/dL   RDW 13.3 11.5 - 15.5 %   Platelets 287 150 - 400 K/uL  hCG, quantitative, pregnancy     Status: Abnormal   Collection Time: 09/07/16  6:46 PM  Result Value Ref Range   hCG, Beta Chain, Quant, S 15,535 (H) <5 mIU/mL  ABO/Rh     Status: None (Preliminary result)   Collection Time: 09/07/16  6:46 PM  Result Value Ref Range   ABO/RH(D) O POS     --/--/O POS (08/24 1846)  IMAGING US Ob Comp Less 14 Wks  Result Date: 09/07/2016 CLINICAL DATA:  Pregnant, pelvic/ abdominal pain x1 week EXAM: OBSTETRIC <14 WK [place] AND TRANSVAGINAL OB US TECHNIQUE: Both transabdominal and transvaginal ultrasound examinations were performed for complete evaluation of the gestation as well as the maternal uterus, adnexal regions, and pelvic cul-de-sac. Transvaginal technique was performed to assess early pregnancy. COMPARISON:  None. FINDINGS: Intrauterine gestational sac: Single Yolk sac:  Visualized. Embryo:  Not Visualized. MSD: 7.9  mm   5 w   3  d Subchorionic hemorrhage:  Moderate subchronic hemorrhage. Maternal uterus/adnexae: Bilateral ovaries are within normal limits. Small volume pelvic fluid. IMPRESSION: Single intrauterine gestational sac with yolk sac, measuring 5 weeks 3 days by mean sac diameter. No fetal pole is visualized. Consider follow-up pelvic ultrasound in 14 days to confirm viability, as clinically warranted. Electronically Signed   By: Julian Hy M.D.   On: 09/07/2016 19:49   US Ob Transvaginal  Result Date: 09/07/2016 CLINICAL DATA:   Pregnant, pelvic/ abdominal pain x1 week EXAM: OBSTETRIC <14 WK [place] AND TRANSVAGINAL OB US TECHNIQUE: Both transabdominal and transvaginal ultrasound examinations were performed for complete evaluation of the gestation as well as the maternal uterus, adnexal regions, and pelvic cul-de-sac. Transvaginal technique was performed to assess early pregnancy. COMPARISON:  None. FINDINGS: Intrauterine gestational sac: Single Yolk sac:  Visualized. Embryo:  Not Visualized. MSD: 7.9  mm   5 w   3  d Subchorionic hemorrhage:  Moderate subchronic hemorrhage. Maternal uterus/adnexae: Bilateral ovaries are within normal limits. Small volume pelvic fluid. IMPRESSION: Single intrauterine gestational sac with yolk sac, measuring 5 weeks 3 days by mean sac diameter. No fetal pole is visualized. Consider follow-up pelvic ultrasound in 14 days to confirm viability, as clinically warranted. Electronically Signed   By: Bertis Ruddy  [place] Pink M.D.   On: 09/07/2016 19:49    MAU Management/MDM: Ordered labs and [place] and reviewed results.  IUP noted on today's exam.  D/C home. Pt to start prenatal care as desired.  Zofran Rx given at Urgent Care yesterday changed to Phenergan for nausea.  Pt stable at time of discharge.  ASSESSMENT 1. Normal IUP (intrauterine pregnancy) on prenatal ultrasound, first trimester   2. Abdominal pain during pregnancy in first trimester     PLAN Discharge home Allergies as of 09/07/2016   No Known Allergies     Medication List    TAKE these medications   azithromycin 500 MG tablet Commonly known as:  ZITHROMAX Take 2 tablets (1,000 mg total) by mouth once.   metroNIDAZOLE 500 MG tablet Commonly known as:  FLAGYL Take 4 tablets (2,000 mg total) by mouth once.   nitrofurantoin (macrocrystal-monohydrate) 100 MG capsule Commonly known as:  MACROBID Take 1 capsule (100 mg total) by mouth 2 (two) times daily.   Prenatal Vitamin 27-0.8 MG Tabs Take 1 tablet by mouth daily.   promethazine 25 MG  tablet Commonly known as:  PHENERGAN Take 0.5-1 tablets (12.5-25 mg total) by mouth every 6 (six) hours as needed.            Discharge Care Instructions        Start     Ordered   09/07/16 0000  promethazine (PHENERGAN) 25 MG tablet  Every 6 hours PRN    Question:  Supervising Provider  Answer:  Donnamae Jude   09/07/16 2019   09/07/16 0000  Discharge patient    Question Answer Comment  Discharge disposition 01-Home or Self Care   Discharge patient date 09/07/2016      09/07/16 2019     Follow-up Information    Prenatal care provider of your choice Follow up.   Why:  See list provided, return to MAU as needed for emergencies          Fatima Blank Certified Nurse-Midwife 09/07/2016  8:20 PM

## 2016-09-07 NOTE — Telephone Encounter (Signed)
Please let patient and the health department know that test for chlamydia was positive.  Will send rx for zithromax to the pharmacy of record, [facility] [place] on [address].  Pt should refrain from sexual intercourse for 7 days to give the medicine time to work.  Sexual partners need to be notified and tested/treated.  Condoms may reduce risk of reinfection.    Test for trichomonas was positive.  Prescription for metronidazole sent to the pharmacy. Rx for ondansetron sent to pharmacy, in case there is nausea. Test for bacterial vaginosis was also positive; the prescription for metronidazole will cover the bacterial vaginosis.   Recheck or followup with PCP for further evaluation if symptoms are not improving.  LM

## 2016-09-08 LAB — URINE CULTURE

## 2016-09-08 LAB — HIV ANTIBODY (ROUTINE TESTING W REFLEX): HIV SCREEN 4TH GENERATION: NONREACTIVE

## 2016-09-19 ENCOUNTER — Inpatient Hospital Stay (HOSPITAL_COMMUNITY): Payer: Medicaid Other

## 2016-09-19 ENCOUNTER — Inpatient Hospital Stay (HOSPITAL_COMMUNITY)
Admission: AD | Admit: 2016-09-19 | Discharge: 2016-09-19 | Disposition: A | Discharge status: Home / Self Care | Payer: Medicaid Other | Source: Ambulatory Visit | Attending: Obstetrics and Gynecology | Admitting: Obstetrics and Gynecology

## 2016-09-19 DIAGNOSIS — O26891 Other specified pregnancy related conditions, first trimester: Secondary | ICD-10-CM | POA: Diagnosis present

## 2016-09-19 DIAGNOSIS — R109 Unspecified abdominal pain: Secondary | ICD-10-CM

## 2016-09-19 DIAGNOSIS — R102 Pelvic and perineal pain: Secondary | ICD-10-CM | POA: Diagnosis present

## 2016-09-19 DIAGNOSIS — O26899 Other specified pregnancy related conditions, unspecified trimester: Secondary | ICD-10-CM

## 2016-09-19 DIAGNOSIS — Z3A01 Less than 8 weeks gestation of pregnancy: Secondary | ICD-10-CM | POA: Insufficient documentation

## 2016-09-19 LAB — URINALYSIS, ROUTINE W REFLEX MICROSCOPIC
Bilirubin Urine: NEGATIVE
Glucose, UA: NEGATIVE mg/dL
Hgb urine dipstick: NEGATIVE
KETONES UR: NEGATIVE mg/dL
LEUKOCYTES UA: NEGATIVE
NITRITE: NEGATIVE
PROTEIN: NEGATIVE mg/dL
Specific Gravity, Urine: >1.03 — ABNORMAL HIGH (ref 1.005–1.030)
pH: 5.5 (ref 5.0–8.0)

## 2016-09-19 NOTE — MAU Provider Note (Signed)
Chief Complaint: Abdominal Pain   First Provider Initiated Contact with Patient 09/19/16 2015        SUBJECTIVE HPI: Heather Durham is a 21 y.o. G1P0 at 102w1d by LMP who presents to maternity admissions reporting brown spotting and cramping.  Spotting stopped today.  Worried about miscarriage. She denies vaginal bleeding, vaginal itching/burning, urinary symptoms, h/a, dizziness, n/v, or fever/chills.     Abdominal Pain  This is a new problem. The current episode started today. The onset quality is gradual. The problem occurs intermittently. The problem has been unchanged. The pain is located in the suprapubic region. The pain is mild. The quality of the pain is cramping. The abdominal pain does not radiate. Pertinent negatives include no constipation, diarrhea, dysuria, fever, myalgias, nausea or vomiting. Nothing aggravates the pain. The pain is relieved by nothing. She has tried nothing for the symptoms.    RN Note: Patient came in this evening complaining of on going abdominal cramping. Brown spotting noticed on Monday and non since. Vitals stable.  No past medical history on file. Past Surgical History:  Procedure Laterality Date  . FOOT SURGERY Right    Social History   Social History  . Marital status: Single    Spouse name: N/A  . Number of children: N/A  . Years of education: N/A   Occupational History  . Not on file.   Social History Main Topics  . Smoking status: Never Smoker  . Smokeless tobacco: Never Used  . Alcohol use No  . Drug use: No  . Sexual activity: Yes    Birth control/ protection: None   Other Topics Concern  . Not on file   Social History Narrative  . No narrative on file   No current facility-administered medications on file prior to encounter.    Current Outpatient Prescriptions on File Prior to Encounter  Medication Sig Dispense Refill  . Prenatal Vit-Fe Fumarate-FA (PRENATAL VITAMIN) 27-0.8 MG TABS Take 1 tablet by mouth daily. 30  tablet 1  . promethazine (PHENERGAN) 25 MG tablet Take 0.5-1 tablets (12.5-25 mg total) by mouth every 6 (six) hours as needed. 30 tablet 2   No Known Allergies  I have reviewed patient's Past Medical Hx, Surgical Hx, Family Hx, Social Hx, medications and allergies.   ROS:  Review of Systems  Constitutional: Negative for fever.  Gastrointestinal: Positive for abdominal pain. Negative for constipation, diarrhea, nausea and vomiting.  Genitourinary: Negative for dysuria.  Musculoskeletal: Negative for myalgias.   Review of Systems  Other systems negative   Physical Exam  Physical Exam Patient Vitals for the past 24 hrs:  BP Temp Temp src Pulse Resp SpO2  09/19/16 2021 123/75 98.3 F (36.8 C) Oral 92 18 100 %   Constitutional: Well-developed, well-nourished female in no acute distress.  Cardiovascular: normal rate Respiratory: normal effort GI: Abd soft, non-tender. Pos BS x 4 MS: Extremities nontender, no edema, normal ROM Neurologic: Alert and oriented x 4.  GU: Neg CVAT.  PELVIC EXAM: Cervix pink, visually closed, without lesion, scant white creamy discharge, vaginal walls and external genitalia normal Bimanual exam: Cervix 0/long/high, firm, anterior, neg CMT, uterus nontender, nonenlarged, adnexa without tenderness, enlargement, or mass    LAB RESULTS Results for orders placed or performed during the hospital encounter of 09/19/16 (from the past 24 hour(s))  Urinalysis, Routine w reflex microscopic     Status: Abnormal   Collection Time: 09/19/16  8:10 PM  Result Value Ref Range   Color, Urine YELLOW  YELLOW   APPearance CLEAR CLEAR   Specific Gravity, Urine >1.030 (H) 1.005 - 1.030   pH 5.5 5.0 - 8.0   Glucose, UA NEGATIVE NEGATIVE mg/dL   Hgb urine dipstick NEGATIVE NEGATIVE   Bilirubin Urine NEGATIVE NEGATIVE   Ketones, ur NEGATIVE NEGATIVE mg/dL   Protein, ur NEGATIVE NEGATIVE mg/dL   Nitrite NEGATIVE NEGATIVE   Leukocytes, UA NEGATIVE NEGATIVE    --/--/O  POS (08/24 1846)  IMAGING US Ob Comp Less 14 Wks  Result Date: 09/07/2016 CLINICAL DATA:  Pregnant, pelvic/ abdominal pain x1 week EXAM: OBSTETRIC <14 WK [place] AND TRANSVAGINAL OB US TECHNIQUE: Both transabdominal and transvaginal ultrasound examinations were performed for complete evaluation of the gestation as well as the maternal uterus, adnexal regions, and pelvic cul-de-sac. Transvaginal technique was performed to assess early pregnancy. COMPARISON:  None. FINDINGS: Intrauterine gestational sac: Single Yolk sac:  Visualized. Embryo:  Not Visualized. MSD: 7.9  mm   5 w   3  d Subchorionic hemorrhage:  Moderate subchronic hemorrhage. Maternal uterus/adnexae: Bilateral ovaries are within normal limits. Small volume pelvic fluid. IMPRESSION: Single intrauterine gestational sac with yolk sac, measuring 5 weeks 3 days by mean sac diameter. No fetal pole is visualized. Consider follow-up pelvic ultrasound in 14 days to confirm viability, as clinically warranted. Electronically Signed   By: Julian Hy M.D.   On: 09/07/2016 19:49   US Ob Transvaginal  Result Date: 09/19/2016 CLINICAL DATA:  Abdominal cramping, first trimester of pregnancy. EXAM: TRANSVAGINAL OB ULTRASOUND TECHNIQUE: Transvaginal ultrasound was performed for complete evaluation of the gestation as well as the maternal uterus, adnexal regions, and pelvic cul-de-sac. COMPARISON:  Ultrasound of September 07, 2016. FINDINGS: Intrauterine gestational sac: Single. Yolk sac:  Visualized. Embryo:  Visualized. Cardiac Activity: Visualized. Heart Rate: 144 bpm CRL:   12.2  mm   7 w 3 d                  [place] EDC: May 05, 2017. Subchorionic hemorrhage:  None visualized. Maternal uterus/adnexae: Right ovary appears normal. Probable corpus luteum cyst seen in left ovary. No free fluid is noted. IMPRESSION: Single live intrauterine gestation of 7 weeks 3 days. Electronically Signed   By: Marijo Conception, M.D.   On: 09/19/2016 21:15   US Ob  Transvaginal  Result Date: 09/07/2016 CLINICAL DATA:  Pregnant, pelvic/ abdominal pain x1 week EXAM: OBSTETRIC <14 WK [place] AND TRANSVAGINAL OB US TECHNIQUE: Both transabdominal and transvaginal ultrasound examinations were performed for complete evaluation of the gestation as well as the maternal uterus, adnexal regions, and pelvic cul-de-sac. Transvaginal technique was performed to assess early pregnancy. COMPARISON:  None. FINDINGS: Intrauterine gestational sac: Single Yolk sac:  Visualized. Embryo:  Not Visualized. MSD: 7.9  mm   5 w   3  d Subchorionic hemorrhage:  Moderate subchronic hemorrhage. Maternal uterus/adnexae: Bilateral ovaries are within normal limits. Small volume pelvic fluid. IMPRESSION: Single intrauterine gestational sac with yolk sac, measuring 5 weeks 3 days by mean sac diameter. No fetal pole is visualized. Consider follow-up pelvic ultrasound in 14 days to confirm viability, as clinically warranted. Electronically Signed   By: Julian Hy M.D.   On: 09/07/2016 19:49     MAU Management/MDM: Reviewed US findings Discussed this is reassuring Recommend keep appt for new OB visit   ASSESSMENT 1. Pelvic pain affecting pregnancy   2.     Live single IUP at [redacted]w[redacted]d   PLAN Discharge home Pelvic rest Keep appt for new OB appt  Pt stable at time of discharge. Encouraged to return here or to other Urgent Care/ED if she develops worsening of symptoms, increase in pain, fever, or other concerning symptoms.    Hansel Feinstein CNM, MSN Certified Nurse-Midwife 09/19/2016  8:33 PM

## 2016-09-19 NOTE — MAU Note (Signed)
Patient came in this evening complaining of on going abdominal cramping. Brown spotting noticed on Monday and non since. Vitals stable.

## 2016-09-19 NOTE — MAU Note (Signed)
Urine in the lab  

## 2016-09-19 NOTE — Discharge Instructions (Signed)

## 2016-10-09 ENCOUNTER — Ambulatory Visit (INDEPENDENT_AMBULATORY_CARE_PROVIDER_SITE_OTHER): Payer: Medicaid Other | Admitting: Obstetrics & Gynecology

## 2016-10-09 ENCOUNTER — Encounter: Payer: Self-pay | Admitting: *Deleted

## 2016-10-09 ENCOUNTER — Encounter: Payer: Self-pay | Admitting: Radiology

## 2016-10-09 ENCOUNTER — Encounter: Payer: Self-pay | Admitting: Obstetrics & Gynecology

## 2016-10-09 VITALS — BP 112/73 | HR 87 | Wt 224.0 lb

## 2016-10-09 DIAGNOSIS — E669 Obesity, unspecified: Secondary | ICD-10-CM

## 2016-10-09 DIAGNOSIS — Z3480 Encounter for supervision of other normal pregnancy, unspecified trimester: Secondary | ICD-10-CM | POA: Insufficient documentation

## 2016-10-09 DIAGNOSIS — O99210 Obesity complicating pregnancy, unspecified trimester: Secondary | ICD-10-CM | POA: Insufficient documentation

## 2016-10-09 DIAGNOSIS — Z3481 Encounter for supervision of other normal pregnancy, first trimester: Secondary | ICD-10-CM

## 2016-10-09 DIAGNOSIS — O99211 Obesity complicating pregnancy, first trimester: Secondary | ICD-10-CM

## 2016-10-09 NOTE — Progress Notes (Signed)
Subjective:   Heather Durham is a 21 y.o. G1P0000 at [redacted]w[redacted]d by early ultrasound being seen today for her first obstetrical visit.  Patient does intend to breast feed. Pregnancy history fully reviewed.  Patient reports no complaints.  HISTORY: Obstetric History   G1   P0   T0   P0   A0   L0    SAB0   TAB0   Ectopic0   Multiple0   Live Births0     # Outcome Date GA Lbr Len/2nd Weight Sex Delivery Anes PTL Lv  1 Current              History reviewed. No pertinent past medical history. Past Surgical History:  Procedure Laterality Date  . FOOT SURGERY Right    History reviewed. No pertinent family history. Social History  Substance Use Topics  . Smoking status: Never Smoker  . Smokeless tobacco: Never Used  . Alcohol use No   No Known Allergies Current Outpatient Prescriptions on File Prior to Visit  Medication Sig Dispense Refill  . Prenatal Vit-Fe Fumarate-FA (PRENATAL VITAMIN) 27-0.8 MG TABS Take 1 tablet by mouth daily. 30 tablet 1  . promethazine (PHENERGAN) 25 MG tablet Take 0.5-1 tablets (12.5-25 mg total) by mouth every 6 (six) hours as needed. 30 tablet 2   No current facility-administered medications on file prior to visit.      Exam   Vitals:   10/09/16 1333  BP: 112/73  Pulse: 87  Weight: 224 lb (101.6 kg)      Uterus:     Pelvic Exam: Deferred   System: General: well-developed, well-nourished female in no acute distress   Breast:  normal appearance, no masses or tenderness   Skin: normal coloration and turgor, no rashes   Neurologic: oriented, normal, negative, normal mood   Extremities: normal strength, tone, and muscle mass, ROM of all joints is normal   HEENT PERRLA, extraocular movement intact and sclera clear, anicteric   Mouth/Teeth mucous membranes moist, pharynx normal without lesions and dental hygiene good   Neck supple and no masses   Cardiovascular: regular rate and rhythm   Respiratory:  no respiratory distress, normal breath  sounds   Abdomen: soft, non-tender; bowel sounds normal; no masses,  no organomegaly     Assessment:   Pregnancy: G1P0000 Patient Active Problem List   Diagnosis Date Noted  . Supervision of other normal pregnancy, antepartum 10/09/2016  . Obesity in pregnancy, antepartum 10/09/2016     Plan:  1. Obesity affecting pregnancy, antepartum - CMP and Liver - Hemoglobin A1c  2. Supervision of other normal pregnancy, antepartum - SMN1 Copy Number Analysis - Culture, OB Urine - Cystic Fibrosis Mutation 97 - [place] MFM Fetal Nuchal Translucency; Future - GC/Chlamydia probe amp ([place])not at [facility] - Hemoglobinopathy evaluation - Obstetric Panel, Including HIV - Enroll Patient in Babyscripts - Babyscripts Schedule Optimization Initial labs drawn.  Declined flu vaccine. Continue prenatal vitamins. Genetic Screening discussed, First trimester screen: ordered. Ultrasound discussed; fetal anatomic survey: to be ordered later. Problem list reviewed and updated.  Enrolled in Babyscripts.  The nature of [place] with multiple MDs and other Advanced Practice Providers was explained to patient; also emphasized that residents, students are part of our team. Routine obstetric precautions reviewed. Return in about 2 weeks (around 10/23/2016) for OB 12 week visit (Babyscripts).     Verita Schneiders, MD, [place] Attending [place], [facility] for [facility], [place]

## 2016-10-09 NOTE — Patient Instructions (Signed)
First Trimester of Pregnancy The first trimester of pregnancy is from week 1 until the end of week 13 (months 1 through 3). A week after a sperm fertilizes an egg, the egg will implant on the wall of the uterus. This embryo will begin to develop into a baby. Genes from you and your partner will form the baby. The female genes will determine whether the baby will be a boy or a girl. At 6-8 weeks, the eyes and face will be formed, and the heartbeat can be seen on ultrasound. At the end of 12 weeks, all the baby's organs will be formed. Now that you are pregnant, you will want to do everything you can to have a healthy baby. Two of the most important things are to get good prenatal care and to follow your health care provider's instructions. Prenatal care is all the medical care you receive before the baby's birth. This care will help prevent, find, and treat any problems during the pregnancy and childbirth. Body changes during your first trimester Your body goes through many changes during pregnancy. The changes vary from woman to woman.  You may gain or lose a couple of pounds at first.  You may feel sick to your stomach (nauseous) and you may throw up (vomit). If the vomiting is uncontrollable, call your health care provider.  You may tire easily.  You may develop headaches that can be relieved by medicines. All medicines should be approved by your health care provider.  You may urinate more often. Painful urination may mean you have a bladder infection.  You may develop heartburn as a result of your pregnancy.  You may develop constipation because certain hormones are causing the muscles that push stool through your intestines to slow down.  You may develop hemorrhoids or swollen veins (varicose veins).  Your breasts may begin to grow larger and become tender. Your nipples may stick out more, and the tissue that surrounds them (areola) may become darker.  Your gums may bleed and may be  sensitive to brushing and flossing.  Dark spots or blotches (chloasma, mask of pregnancy) may develop on your face. This will likely fade after the baby is born.  Your menstrual periods will stop.  You may have a loss of appetite.  You may develop cravings for certain kinds of food.  You may have changes in your emotions from day to day, such as being excited to be pregnant or being concerned that something may go wrong with the pregnancy and baby.  You may have more vivid and strange dreams.  You may have changes in your hair. These can include thickening of your hair, rapid growth, and changes in texture. Some women also have hair loss during or after pregnancy, or hair that feels dry or thin. Your hair will most likely return to normal after your baby is born.  What to expect at prenatal visits During a routine prenatal visit:  You will be weighed to make sure you and the baby are growing normally.  Your blood pressure will be taken.  Your abdomen will be measured to track your baby's growth.  The fetal heartbeat will be listened to between weeks 10 and 14 of your pregnancy.  Test results from any previous visits will be discussed.  Your health care provider may ask you:  How you are feeling.  If you are feeling the baby move.  If you have had any abnormal symptoms, such as leaking fluid, bleeding, severe headaches,   or abdominal cramping.  If you are using any tobacco products, including cigarettes, chewing tobacco, and electronic cigarettes.  If you have any questions.  Other tests that may be performed during your first trimester include:  Blood tests to find your blood type and to check for the presence of any previous infections. The tests will also be used to check for low iron levels (anemia) and protein on red blood cells (Rh antibodies). Depending on your risk factors, or if you previously had diabetes during pregnancy, you may have tests to check for high blood  sugar that affects pregnant women (gestational diabetes).  Urine tests to check for infections, diabetes, or protein in the urine.  An ultrasound to confirm the proper growth and development of the baby.  Fetal screens for spinal cord problems (spina bifida) and Down syndrome.  HIV (human immunodeficiency virus) testing. Routine prenatal testing includes screening for HIV, unless you choose not to have this test.  You may need other tests to make sure you and the baby are doing well.  Follow these instructions at home: Medicines  Follow your health care provider's instructions regarding medicine use. Specific medicines may be either safe or unsafe to take during pregnancy.  Take a prenatal vitamin that contains at least 600 micrograms (mcg) of folic acid.  If you develop constipation, try taking a stool softener if your health care provider approves. Eating and drinking  Eat a balanced diet that includes fresh fruits and vegetables, whole grains, good sources of protein such as meat, eggs, or tofu, and low-fat dairy. Your health care provider will help you determine the amount of weight gain that is right for you.  Avoid raw meat and uncooked cheese. These carry germs that can cause birth defects in the baby.  Eating four or five small meals rather than three large meals a day may help relieve nausea and vomiting. If you start to feel nauseous, eating a few soda crackers can be helpful. Drinking liquids between meals, instead of during meals, also seems to help ease nausea and vomiting.  Limit foods that are high in fat and processed sugars, such as fried and sweet foods.  To prevent constipation: ? Eat foods that are high in fiber, such as fresh fruits and vegetables, whole grains, and beans. ? Drink enough fluid to keep your urine clear or pale yellow. Activity  Exercise only as directed by your health care provider. Most women can continue their usual exercise routine during  pregnancy. Try to exercise for 30 minutes at least 5 days a week. Exercising will help you: ? Control your weight. ? Stay in shape. ? Be prepared for labor and delivery.  Experiencing pain or cramping in the lower abdomen or lower back is a good sign that you should stop exercising. Check with your health care provider before continuing with normal exercises.  Try to avoid standing for long periods of time. Move your legs often if you must stand in one place for a long time.  Avoid heavy lifting.  Wear low-heeled shoes and practice good posture.  You may continue to have sex unless your health care provider tells you not to. Relieving pain and discomfort  Wear a good support bra to relieve breast tenderness.  Take warm sitz baths to soothe any pain or discomfort caused by hemorrhoids. Use hemorrhoid cream if your health care provider approves.  Rest with your legs elevated if you have leg cramps or low back pain.  If you develop   varicose veins in your legs, wear support hose. Elevate your feet for 15 minutes, 3-4 times a day. Limit salt in your diet. Prenatal care  Schedule your prenatal visits by the twelfth week of pregnancy. They are usually scheduled monthly at first, then more often in the last 2 months before delivery.  Write down your questions. Take them to your prenatal visits.  Keep all your prenatal visits as told by your health care provider. This is important. Safety  Wear your seat belt at all times when driving.  Make a list of emergency phone numbers, including numbers for family, friends, the hospital, and police and fire departments. General instructions  Ask your health care provider for a referral to a local prenatal education class. Begin classes no later than the beginning of month 6 of your pregnancy.  Ask for help if you have counseling or nutritional needs during pregnancy. Your health care provider can offer advice or refer you to specialists for help  with various needs.  Do not use hot tubs, steam rooms, or saunas.  Do not douche or use tampons or scented sanitary pads.  Do not cross your legs for long periods of time.  Avoid cat litter boxes and soil used by cats. These carry germs that can cause birth defects in the baby and possibly loss of the fetus by miscarriage or stillbirth.  Avoid all smoking, herbs, alcohol, and medicines not prescribed by your health care provider. Chemicals in these products affect the formation and growth of the baby.  Do not use any products that contain nicotine or tobacco, such as cigarettes and e-cigarettes. If you need help quitting, ask your health care provider. You may receive counseling support and other resources to help you quit.  Schedule a dentist appointment. At home, brush your teeth with a soft toothbrush and be gentle when you floss. Contact a health care provider if:  You have dizziness.  You have mild pelvic cramps, pelvic pressure, or nagging pain in the abdominal area.  You have persistent nausea, vomiting, or diarrhea.  You have a bad smelling vaginal discharge.  You have pain when you urinate.  You notice increased swelling in your face, hands, legs, or ankles.  You are exposed to fifth disease or chickenpox.  You are exposed to German measles (rubella) and have never had it. Get help right away if:  You have a fever.  You are leaking fluid from your vagina.  You have spotting or bleeding from your vagina.  You have severe abdominal cramping or pain.  You have rapid weight gain or loss.  You vomit blood or material that looks like coffee grounds.  You develop a severe headache.  You have shortness of breath.  You have any kind of trauma, such as from a fall or a car accident. Summary  The first trimester of pregnancy is from week 1 until the end of week 13 (months 1 through 3).  Your body goes through many changes during pregnancy. The changes vary from  woman to woman.  You will have routine prenatal visits. During those visits, your health care provider will examine you, discuss any test results you may have, and talk with you about how you are feeling. This information is not intended to replace advice given to you by your health care provider. Make sure you discuss any questions you have with your health care provider. Document Released: 12/26/2000 Document Revised: 12/14/2015 Document Reviewed: 12/14/2015 Elsevier Interactive Patient Education  2017 Elsevier   Inc.  

## 2016-10-10 ENCOUNTER — Encounter: Payer: Self-pay | Admitting: Radiology

## 2016-10-10 ENCOUNTER — Encounter: Payer: Self-pay | Admitting: *Deleted

## 2016-10-11 LAB — URINE CULTURE, OB REFLEX

## 2016-10-11 LAB — CULTURE, OB URINE

## 2016-10-16 DIAGNOSIS — Z3480 Encounter for supervision of other normal pregnancy, unspecified trimester: Secondary | ICD-10-CM

## 2016-10-16 LAB — OBSTETRIC PANEL, INCLUDING HIV
Antibody Screen: NEGATIVE
BASOS ABS: 0 10*3/uL (ref 0.0–0.2)
Basos: 0 %
EOS (ABSOLUTE): 0.1 10*3/uL (ref 0.0–0.4)
Eos: 1 %
HEP B S AG: NEGATIVE
HIV SCREEN 4TH GENERATION: NONREACTIVE
Hematocrit: 35.2 % (ref 34.0–46.6)
Hemoglobin: 11.3 g/dL (ref 11.1–15.9)
IMMATURE GRANULOCYTES: 0 %
Immature Grans (Abs): 0 10*3/uL (ref 0.0–0.1)
LYMPHS ABS: 2.3 10*3/uL (ref 0.7–3.1)
Lymphs: 28 %
MCH: 29.4 pg (ref 26.6–33.0)
MCHC: 32.1 g/dL (ref 31.5–35.7)
MCV: 91 fL (ref 79–97)
MONOS ABS: 0.6 10*3/uL (ref 0.1–0.9)
Monocytes: 7 %
NEUTROS ABS: 5.2 10*3/uL (ref 1.4–7.0)
NEUTROS PCT: 64 %
PLATELETS: 291 10*3/uL (ref 150–379)
RBC: 3.85 x10E6/uL (ref 3.77–5.28)
RDW: 14 % (ref 12.3–15.4)
RPR Ser Ql: NONREACTIVE
Rh Factor: POSITIVE
Rubella Antibodies, IGG: 5.74 index (ref >0.99)
WBC: 8.2 10*3/uL (ref 3.4–10.8)

## 2016-10-16 LAB — SMN1 COPY NUMBER ANALYSIS (SMA CARRIER SCREENING)

## 2016-10-16 LAB — CMP AND LIVER
ALBUMIN: 3.9 g/dL (ref 3.5–5.5)
ALK PHOS: 57 IU/L (ref 39–117)
ALT: 10 IU/L (ref 0–32)
AST: 17 IU/L (ref 0–40)
BILIRUBIN, DIRECT: 0.08 mg/dL (ref 0.00–0.40)
BUN: 9 mg/dL (ref 6–20)
CHLORIDE: 100 mmol/L (ref 96–106)
CO2: 21 mmol/L (ref 20–29)
Calcium: 9.4 mg/dL (ref 8.7–10.2)
Creatinine, Ser: 0.65 mg/dL (ref 0.57–1.00)
GFR calc Af Amer: 148 mL/min/{1.73_m2} (ref >59)
GFR, EST NON AFRICAN AMERICAN: 128 mL/min/{1.73_m2} (ref >59)
GLUCOSE: 73 mg/dL (ref 65–99)
POTASSIUM: 3.9 mmol/L (ref 3.5–5.2)
Sodium: 137 mmol/L (ref 134–144)
Total Protein: 6.7 g/dL (ref 6.0–8.5)

## 2016-10-16 LAB — HEMOGLOBINOPATHY EVALUATION
HEMOGLOBIN A2 QUANTITATION: 2.2 % (ref 1.8–3.2)
HEMOGLOBIN F QUANTITATION: 0 % (ref 0.0–2.0)
HGB C: 0 %
HGB S: 0 %
HGB VARIANT: 0 %
Hgb A: 97.8 % (ref 96.4–98.8)

## 2016-10-16 LAB — HEMOGLOBIN A1C
ESTIMATED AVERAGE GLUCOSE: 100 mg/dL
HEMOGLOBIN A1C: 5.1 % (ref 4.8–5.6)

## 2016-10-16 LAB — CYSTIC FIBROSIS MUTATION 97: Interpretation: NOT DETECTED

## 2016-10-23 ENCOUNTER — Encounter (HOSPITAL_COMMUNITY): Payer: Self-pay | Admitting: Family Medicine

## 2016-10-23 ENCOUNTER — Encounter: Payer: Self-pay | Admitting: Radiology

## 2016-10-23 ENCOUNTER — Ambulatory Visit (INDEPENDENT_AMBULATORY_CARE_PROVIDER_SITE_OTHER): Payer: Medicaid Other | Admitting: Obstetrics & Gynecology

## 2016-10-23 VITALS — BP 113/75 | HR 90 | Wt 222.0 lb

## 2016-10-23 DIAGNOSIS — Z3481 Encounter for supervision of other normal pregnancy, first trimester: Secondary | ICD-10-CM

## 2016-10-23 DIAGNOSIS — Z3480 Encounter for supervision of other normal pregnancy, unspecified trimester: Secondary | ICD-10-CM

## 2016-10-23 NOTE — Progress Notes (Signed)
   PRENATAL VISIT NOTE  Subjective:  Heather Durham is a 21 y.o. G1P0000 at [redacted]w[redacted]d being seen today for ongoing prenatal care.  She is currently monitored for the following issues for this low-risk pregnancy and has Supervision of other normal pregnancy, antepartum and Obesity in pregnancy, antepartum on her problem list.  Patient reports no complaints.   .  .  Movement: Absent. Denies leaking of fluid.   The following portions of the patient's history were reviewed and updated as appropriate: allergies, current medications, past family history, past medical history, past social history, past surgical history and problem list. Problem list updated.  Objective:   Vitals:   10/23/16 1525  BP: 113/75  Pulse: 90  Weight: 222 lb (100.7 kg)    Fetal Status:     Movement: Absent     General:  Alert, oriented and cooperative. Patient is in no acute distress.  Skin: Skin is warm and dry. No rash noted.   Cardiovascular: Normal heart rate noted  Respiratory: Normal respiratory effort, no problems with respiration noted  Abdomen: Soft, gravid, appropriate for gestational age.  Pain/Pressure: Absent     Pelvic: Cervical exam deferred        Extremities: Normal range of motion.  Edema: None  Mental Status:  Normal mood and affect. Normal behavior. Normal judgment and thought content.   Assessment and Plan:  Pregnancy: G1P0000 at [redacted]w[redacted]d  1. Supervision of other normal pregnancy, antepartum First trimester screen and NT scan next week. Anatomy scan ordered - US MFM OB DETAIL +14 WK; Future No other complaints or concerns.  Routine obstetric precautions reviewed. Doing well on Babyscripts. Please refer to After Visit Summary for other counseling recommendations.  Return in about 8 weeks (around 12/18/2016) for OB 20 week visit (Babyscripts) and AFP lab.   Verita Schneiders, MD

## 2016-10-23 NOTE — Patient Instructions (Signed)

## 2016-10-31 ENCOUNTER — Ambulatory Visit (HOSPITAL_COMMUNITY)
Admission: RE | Admit: 2016-10-31 | Discharge: 2016-10-31 | Disposition: A | Discharge status: Home / Self Care | Payer: Medicaid Other | Source: Ambulatory Visit | Attending: Obstetrics & Gynecology | Admitting: Obstetrics & Gynecology

## 2016-10-31 ENCOUNTER — Other Ambulatory Visit: Payer: Self-pay | Admitting: Obstetrics & Gynecology

## 2016-10-31 ENCOUNTER — Encounter (HOSPITAL_COMMUNITY): Payer: Self-pay

## 2016-10-31 DIAGNOSIS — Z3480 Encounter for supervision of other normal pregnancy, unspecified trimester: Secondary | ICD-10-CM

## 2016-10-31 DIAGNOSIS — Z3682 Encounter for antenatal screening for nuchal translucency: Secondary | ICD-10-CM

## 2016-10-31 DIAGNOSIS — Z3A13 13 weeks gestation of pregnancy: Secondary | ICD-10-CM | POA: Diagnosis not present

## 2016-10-31 DIAGNOSIS — O99210 Obesity complicating pregnancy, unspecified trimester: Secondary | ICD-10-CM | POA: Insufficient documentation

## 2016-10-31 HISTORY — DX: Urinary tract infection, site not specified: N39.0

## 2016-10-31 HISTORY — DX: Trichomoniasis, unspecified: A59.9

## 2016-10-31 HISTORY — DX: Chlamydial infection, unspecified: A74.9

## 2016-11-06 ENCOUNTER — Other Ambulatory Visit: Payer: Self-pay

## 2016-11-22 ENCOUNTER — Other Ambulatory Visit: Payer: Self-pay

## 2016-11-22 ENCOUNTER — Encounter (HOSPITAL_COMMUNITY): Payer: Self-pay | Admitting: *Deleted

## 2016-11-22 ENCOUNTER — Inpatient Hospital Stay (HOSPITAL_COMMUNITY)
Admission: AD | Admit: 2016-11-22 | Discharge: 2016-11-22 | Disposition: A | Discharge status: Home / Self Care | Payer: Medicaid Other | Source: Ambulatory Visit | Attending: Obstetrics and Gynecology | Admitting: Obstetrics and Gynecology

## 2016-11-22 DIAGNOSIS — W19XXXA Unspecified fall, initial encounter: Secondary | ICD-10-CM

## 2016-11-22 DIAGNOSIS — R109 Unspecified abdominal pain: Secondary | ICD-10-CM | POA: Diagnosis present

## 2016-11-22 DIAGNOSIS — W109XXA Fall (on) (from) unspecified stairs and steps, initial encounter: Secondary | ICD-10-CM | POA: Insufficient documentation

## 2016-11-22 DIAGNOSIS — B9689 Other specified bacterial agents as the cause of diseases classified elsewhere: Secondary | ICD-10-CM | POA: Diagnosis not present

## 2016-11-22 DIAGNOSIS — O23592 Infection of other part of genital tract in pregnancy, second trimester: Secondary | ICD-10-CM | POA: Diagnosis not present

## 2016-11-22 DIAGNOSIS — N760 Acute vaginitis: Secondary | ICD-10-CM | POA: Diagnosis not present

## 2016-11-22 DIAGNOSIS — O26891 Other specified pregnancy related conditions, first trimester: Secondary | ICD-10-CM | POA: Diagnosis not present

## 2016-11-22 DIAGNOSIS — O26892 Other specified pregnancy related conditions, second trimester: Secondary | ICD-10-CM | POA: Insufficient documentation

## 2016-11-22 DIAGNOSIS — Z3A16 16 weeks gestation of pregnancy: Secondary | ICD-10-CM | POA: Diagnosis not present

## 2016-11-22 DIAGNOSIS — Z6790 Unspecified blood type, Rh positive: Secondary | ICD-10-CM

## 2016-11-22 DIAGNOSIS — Z3480 Encounter for supervision of other normal pregnancy, unspecified trimester: Secondary | ICD-10-CM

## 2016-11-22 LAB — URINALYSIS, MICROSCOPIC (REFLEX): RBC / HPF: NONE SEEN RBC/hpf (ref 0–5)

## 2016-11-22 LAB — URINALYSIS, ROUTINE W REFLEX MICROSCOPIC
Bilirubin Urine: NEGATIVE
Glucose, UA: NEGATIVE mg/dL
Hgb urine dipstick: NEGATIVE
KETONES UR: NEGATIVE mg/dL
NITRITE: NEGATIVE
PH: 6 (ref 5.0–8.0)
Protein, ur: NEGATIVE mg/dL
SPECIFIC GRAVITY, URINE: 1.025 (ref 1.005–1.030)

## 2016-11-22 LAB — WET PREP, GENITAL
Sperm: NONE SEEN
Trich, Wet Prep: NONE SEEN
Yeast Wet Prep HPF POC: NONE SEEN

## 2016-11-22 MED ORDER — METRONIDAZOLE 500 MG PO TABS
500.0000 mg | ORAL_TABLET | Freq: Two times a day (BID) | ORAL | 0 refills | Status: DC
Start: 2016-11-22 — End: 2017-01-22

## 2016-11-22 NOTE — MAU Provider Note (Signed)
History     CSN: [id number]  Arrival date and time: 11/22/16 1722   First Provider Initiated Contact with Patient 11/22/16 1845      Chief Complaint  Patient presents with  . Abdominal Pain  . Fall   G1 @16 .2 wks here with abdominal pain. Pain started after she fell going up the stairs 2 days ago. She hit her abdomen on the step. She has not taken anything for the pain. Describes pain in lower abdomen and worse on left where she made contact. She denies VB but reports clear discharge yesterday that soaked underwear. Hx of trich and Chlamydia earlier in pregnancy and was treated and not had further contact with FOB.    OB History    Gravida Para Term Preterm AB Living   1 0 0 0 0 0   SAB TAB Ectopic Multiple Live Births   0 0 0 0 0      Past Medical History:  Diagnosis Date  . Chlamydia   . Trichomonas infection   . UTI (urinary tract infection)     Past Surgical History:  Procedure Laterality Date  . FOOT SURGERY Right     No family history on file.  Social History   Tobacco Use  . Smoking status: Never Smoker  . Smokeless tobacco: Never Used  Substance Use Topics  . Alcohol use: No  . Drug use: No    Allergies: No Known Allergies  Medications Prior to Admission  Medication Sig Dispense Refill Last Dose  . Prenatal Vit-Fe Fumarate-FA (PRENATAL VITAMIN) 27-0.8 MG TABS Take 1 tablet by mouth daily. 30 tablet 1 Taking  . promethazine (PHENERGAN) 25 MG tablet Take 0.5-1 tablets (12.5-25 mg total) by mouth every 6 (six) hours as needed. (Patient not taking: Reported on 10/31/2016) 30 tablet 2 Not Taking    Review of Systems  Gastrointestinal: Positive for abdominal pain.  Genitourinary: Positive for vaginal discharge. Negative for vaginal bleeding.   Physical Exam   Blood pressure 123/71, pulse 97, temperature 98.6 F (37 C), resp. rate 18, height 5\' 8"  (1.727 m), weight 224 lb (101.6 kg), last menstrual period 07/26/2016.  Physical Exam  Constitutional:  She is oriented to person, place, and time. She appears well-developed and well-nourished. No distress.  HENT:  Head: Normocephalic and atraumatic.  Neck: Normal range of motion.  Respiratory: Effort normal. No respiratory distress.  GI: Soft. She exhibits no distension and no mass. There is tenderness (left mid quadrant). There is no rebound and no guarding.  Fundus U/4  Genitourinary:  Genitourinary Comments: SSE: yellow discharge from os, cervix friable and bled with gentle q-tip contact, no pool, fern neg  Cervix closed/long  Musculoskeletal: Normal range of motion.  Neurological: She is alert and oriented to person, place, and time.  Skin: Skin is warm and dry.  Psychiatric: She has a normal mood and affect.  FHT 150  Limited bedside US: viable, active fetus, +cardiac activity, subj. nml AFV  MAU Course  Procedures  MDM Labs ordered and reviewed. No evidence of SROM or imminent SAB. Will treat for BV and GC/CT pending. Discussed comfort measure for pain. Stable for discharge home.  Assessment and Plan   1. [redacted] weeks gestation of pregnancy   2. Supervision of other normal pregnancy, antepartum   3. Bacterial vaginosis   4. Fall, initial encounter   5. Blood type, Rh positive    Discharge home Follow up in OB office as scheduled SAB/return precautions Tylenol prn Warm bath prn  Rx Flagyl  Allergies as of 11/22/2016   No Known Allergies     Medication List    STOP taking these medications   promethazine 25 MG tablet Commonly known as:  PHENERGAN     TAKE these medications   metroNIDAZOLE 500 MG tablet Commonly known as:  FLAGYL Take 1 tablet (500 mg total) 2 (two) times daily by mouth.   Prenatal Vitamin 27-0.8 MG Tabs Take 1 tablet by mouth daily.      Julianne Handler, CNM 11/22/2016, 7:03 PM

## 2016-11-22 NOTE — MAU Note (Signed)
Pt reports she fell up the stairs on Tuesday. Since the fall she has been having menstrual like cramping on and odff. Also c/o feeling some leaking of fluid.

## 2016-11-22 NOTE — MAU Note (Signed)
Urine in lab 

## 2016-11-22 NOTE — Discharge Instructions (Signed)
Bacterial Vaginosis Bacterial vaginosis is a vaginal infection that occurs when the normal balance of bacteria in the vagina is disrupted. It results from an overgrowth of certain bacteria. This is the most common vaginal infection among women ages 15-44. Because bacterial vaginosis increases your risk for STIs (sexually transmitted infections), getting treated can help reduce your risk for chlamydia, gonorrhea, herpes, and HIV (human immunodeficiency virus). Treatment is also important for preventing complications in pregnant women, because this condition can cause an early (premature) delivery. What are the causes? This condition is caused by an increase in harmful bacteria that are normally present in small amounts in the vagina. However, the reason that the condition develops is not fully understood. What increases the risk? The following factors may make you more likely to develop this condition:  Having a new sexual partner or multiple sexual partners.  Having unprotected sex.  Douching.  Having an intrauterine device (IUD).  Smoking.  Drug and alcohol abuse.  Taking certain antibiotic medicines.  Being pregnant.  You cannot get bacterial vaginosis from toilet seats, bedding, swimming pools, or contact with objects around you. What are the signs or symptoms? Symptoms of this condition include:  Grey or white vaginal discharge. The discharge can also be watery or foamy.  A fish-like odor with discharge, especially after sexual intercourse or during menstruation.  Itching in and around the vagina.  Burning or pain with urination.  Some women with bacterial vaginosis have no signs or symptoms. How is this diagnosed? This condition is diagnosed based on:  Your medical history.  A physical exam of the vagina.  Testing a sample of vaginal fluid under a microscope to look for a large amount of bad bacteria or abnormal cells. Your health care provider may use a cotton swab  or a small wooden spatula to collect the sample.  How is this treated? This condition is treated with antibiotics. These may be given as a pill, a vaginal cream, or a medicine that is put into the vagina (suppository). If the condition comes back after treatment, a second round of antibiotics may be needed. Follow these instructions at home: Medicines  Take over-the-counter and prescription medicines only as told by your health care provider.  Take or use your antibiotic as told by your health care provider. Do not stop taking or using the antibiotic even if you start to feel better. General instructions  If you have a female sexual partner, tell her that you have a vaginal infection. She should see her health care provider and be treated if she has symptoms. If you have a female sexual partner, he does not need treatment.  During treatment: ? Avoid sexual activity until you finish treatment. ? Do not douche. ? Avoid alcohol as directed by your health care provider. ? Avoid breastfeeding as directed by your health care provider.  Drink enough water and fluids to keep your urine clear or pale yellow.  Keep the area around your vagina and rectum clean. ? Wash the area daily with warm water. ? Wipe yourself from front to back after using the toilet.  Keep all follow-up visits as told by your health care provider. This is important. How is this prevented?  Do not douche.  Wash the outside of your vagina with warm water only.  Use protection when having sex. This includes latex condoms and dental dams.  Limit how many sexual partners you have. To help prevent bacterial vaginosis, it is best to have sex with just   one partner (monogamous).  Make sure you and your sexual partner are tested for STIs.  Wear cotton or cotton-lined underwear.  Avoid wearing tight pants and pantyhose, especially during summer.  Limit the amount of alcohol that you drink.  Do not use any products that  contain nicotine or tobacco, such as cigarettes and e-cigarettes. If you need help quitting, ask your health care provider.  Do not use illegal drugs. Where to find more information:  Centers for Disease Control and Prevention: www.cdc.gov/std  American Sexual Health Association (ASHA): www.ashastd.org  U.S. Department of Health and Human Services, Office on Women's Health: www.womenshealth.gov/ or https://www.womenshealth.gov/a-z-topics/bacterial-vaginosis Contact a health care provider if:  Your symptoms do not improve, even after treatment.  You have more discharge or pain when urinating.  You have a fever.  You have pain in your abdomen.  You have pain during sex.  You have vaginal bleeding between periods. Summary  Bacterial vaginosis is a vaginal infection that occurs when the normal balance of bacteria in the vagina is disrupted.  Because bacterial vaginosis increases your risk for STIs (sexually transmitted infections), getting treated can help reduce your risk for chlamydia, gonorrhea, herpes, and HIV (human immunodeficiency virus). Treatment is also important for preventing complications in pregnant women, because the condition can cause an early (premature) delivery.  This condition is treated with antibiotic medicines. These may be given as a pill, a vaginal cream, or a medicine that is put into the vagina (suppository). This information is not intended to replace advice given to you by your health care provider. Make sure you discuss any questions you have with your health care provider. Document Released: 01/01/2005 Document Revised: 09/17/2015 Document Reviewed: 09/17/2015 Elsevier Interactive Patient Education  2017 [employer]  

## 2016-11-23 LAB — GC/CHLAMYDIA PROBE AMP (~~LOC~~) NOT AT ARMC
Chlamydia: POSITIVE — AB
Neisseria Gonorrhea: NEGATIVE

## 2016-12-12 ENCOUNTER — Other Ambulatory Visit: Payer: Self-pay | Admitting: Obstetrics & Gynecology

## 2016-12-12 ENCOUNTER — Ambulatory Visit (HOSPITAL_COMMUNITY)
Admission: RE | Admit: 2016-12-12 | Discharge: 2016-12-12 | Disposition: A | Discharge status: Home / Self Care | Payer: Medicaid Other | Source: Ambulatory Visit | Attending: Obstetrics & Gynecology | Admitting: Obstetrics & Gynecology

## 2016-12-12 DIAGNOSIS — E669 Obesity, unspecified: Secondary | ICD-10-CM | POA: Diagnosis not present

## 2016-12-12 DIAGNOSIS — O99212 Obesity complicating pregnancy, second trimester: Secondary | ICD-10-CM | POA: Insufficient documentation

## 2016-12-12 DIAGNOSIS — Z3A19 19 weeks gestation of pregnancy: Secondary | ICD-10-CM

## 2016-12-12 DIAGNOSIS — Z363 Encounter for antenatal screening for malformations: Secondary | ICD-10-CM

## 2016-12-12 DIAGNOSIS — O321XX0 Maternal care for breech presentation, not applicable or unspecified: Secondary | ICD-10-CM | POA: Diagnosis not present

## 2016-12-12 DIAGNOSIS — Z3480 Encounter for supervision of other normal pregnancy, unspecified trimester: Secondary | ICD-10-CM

## 2016-12-13 NOTE — Progress Notes (Signed)
PRENATAL VISIT NOTE  Subjective:  Heather Durham is a 21 y.o. G1P0000 at [redacted]w[redacted]d being seen today for ongoing prenatal care.  She is currently monitored for the following issues for this low-risk pregnancy and has Supervision of other normal pregnancy, antepartum; Obesity in pregnancy, antepartum; and Chlamydia infection affecting pregnancy in second trimester on their problem list.  Patient reports no complaints. Desires re-treatment for Chlamydia diagnosed on culture earlier this month; had already been treated after initial culture in 08/2016. Has different partner now; only one partner.  Contractions: Not present.  .  Movement: Present. Denies leaking of fluid.   The following portions of the patient's history were reviewed and updated as appropriate: allergies, current medications, past family history, past medical history, past social history, past surgical history and problem list. Problem list updated.  Objective:   Vitals:   12/14/16 0824  BP: 115/78  Pulse: 96  Weight: 221 lb 3.2 oz (100.3 kg)    Fetal Status: Fetal Heart Rate (bpm): 141 Fundal Height: 19 cm Movement: Present     General:  Alert, oriented and cooperative. Patient is in no acute distress.  Skin: Skin is warm and dry. No rash noted.   Cardiovascular: Normal heart rate noted  Respiratory: Normal respiratory effort, no problems with respiration noted  Abdomen: Soft, gravid, appropriate for gestational age.  Pain/Pressure: Absent     Pelvic: Cervical exam deferred        Extremities: Normal range of motion.  Edema: None  Mental Status:  Normal mood and affect. Normal behavior. Normal judgment and thought content.    [place] Mfm Ob Detail +14 Wk  Result Date: 12/12/2016 ----------------------------------------------------------------------  OBSTETRICS REPORT                      (Signed Final 12/12/2016 04:48 pm) ---------------------------------------------------------------------- Patient Info  ID #:        [id number]                          D.O.B.:  [birth year] (21 yrs)  Name:       Heather Durham                Visit Date: 12/12/2016 03:31 pm ---------------------------------------------------------------------- Performed By  Performed By:     Elisabeth Cara        Ref. Address:     [address]                    [place], [place]  [place]  Attending:        Oralia Rud       Location:         [facility]                    MD  Referred By:      Osborne Oman MD ---------------------------------------------------------------------- Orders   #  Description                                 Code   1  [place] MFM OB DETAIL +14 [place]                     76811.01  ----------------------------------------------------------------------   #  Ordered By               Order #        Accession #    Episode #   1  Verita Schneiders           [id number]      [id number]     [id number]  ---------------------------------------------------------------------- Indications   [redacted] weeks gestation of pregnancy                Z3A.19   Encounter for antenatal screening for          Z36.3   malformations   Obesity complicating pregnancy, second         O99.212   trimester  ---------------------------------------------------------------------- OB History  Blood Type:            Height:  5'8"   Weight (lb):  222       BMI:  33.75  Gravidity:    1         Term:   0        Prem:   0        SAB:   0  TOP:          0       Ectopic:  0        Living: 0 ---------------------------------------------------------------------- Fetal Evaluation  Num Of Fetuses:     1  Fetal Heart         150  Rate(bpm):  Cardiac Activity:   Observed  Presentation:       Breech  Placenta:           Anterior, above cervical os  P. Cord Insertion:  Visualized, central   Amniotic Fluid  AFI FV:      Subjectively within normal limits                              Largest Pocket(cm)                              4.76 ---------------------------------------------------------------------- Biometry  BPD:      47.3  mm     G. Age:  20w 2d         69  %    CI:        70.16   %    70 - 86  FL/HC:      17.1   %    16.8 - 19.8  HC:      180.1  mm     G. Age:  20w 3d         59  %    HC/AC:      1.25        1.09 - 1.39  AC:      143.6  mm     G. Age:  19w 5d         40  %    FL/BPD:     65.1   %  FL:       30.8  mm     G. Age:  19w 4d         32  %    FL/AC:      21.4   %    20 - 24  HUM:      31.5  mm     G. Age:  20w 4d         70  %  Est. FW:     309  gm    0 lb 11 oz      46  % ---------------------------------------------------------------------- Gestational Age  LMP:           19w 6d        Date:  07/26/16                 EDD:   05/02/17  U/S Today:     20w 0d                                        EDD:   05/01/17  Best:          19w 6d     Det. By:  LMP  (07/26/16)          EDD:   05/02/17 ---------------------------------------------------------------------- Anatomy  Cranium:               Appears normal         Aortic Arch:            Appears normal  Cavum:                 Not well visualized    Ductal Arch:            Appears normal  Ventricles:            Not well visualized    Diaphragm:              Appears normal  Choroid Plexus:        Appears normal         Stomach:                Appears normal, left                                                                        sided  Cerebellum:            Not well visualized    Abdomen:  Appears normal  Posterior Fossa:       Not well visualized    Abdominal Wall:         Appears nml (cord                                                                        insert, abd wall)  Nuchal Fold:           Not well visualized    Cord Vessels:           Appears normal (3                                                                         vessel cord)  Face:                  Orbits nl; profile not Kidneys:                Appear normal                         well visualized  Lips:                  Appears normal         Bladder:                Appears normal  Thoracic:              Appears normal         Spine:                  Not well visualized  Heart:                 Not well visualized    Upper Extremities:      Appears normal  RVOT:                  Appears normal         Lower Extremities:      Appears normal  LVOT:                  Appears normal  Other:  Parents do not wish to know sex of fetus. ---------------------------------------------------------------------- Cervix Uterus Adnexa  Cervix  Length:           4.19  cm.  Normal appearance by transabdominal scan.  Uterus  No abnormality visualized.  Left Ovary  Not visualized.  Right Ovary  Not visualized.  Adnexa:       No abnormality visualized. No adnexal mass                visualized. ---------------------------------------------------------------------- Impression  SIUP at [redacted]w[redacted]d  biometry is gestational age appropriate; EFW 46th%'le  no defects seen  limited views as above  no previa ---------------------------------------------------------------------- Recommendations  Recommend follow up attempt to complete survey in 4-6  weeks. ----------------------------------------------------------------------  Oralia Rud, MD Electronically Signed Final Report   12/12/2016 04:48 pm ----------------------------------------------------------------------   Assessment and Plan:  Pregnancy: G1P0000 at [redacted]w[redacted]d  1. Evaluate anatomy not seen on prior sonogram Follow up anatomy scan ordered - US MFM OB FOLLOW UP; Future  2. Chlamydia infection affecting pregnancy in second trimester Initial culture on 09/06/2016 was positive, treated. Follow up in 11/22/2016 was also positive, retreated 12/14/2016; told to  take second dose a week later. Expedited partner treatment script given. Recheck TOC at next visit. - azithromycin (ZITHROMAX) 500 MG tablet; Take 2 tablets (1,000 mg total) by mouth once for 1 dose.  Dispense: 2 tablet; Refill: 1  She was informed that she and her sex partner should abstain from unprotected sexual activity for 2 weeks after everyone receives appropriate treatment. Discussed risks of chlamydia infection in pregnancy.  3. Supervision of other normal pregnancy, antepartum Doing well on [employer].  Negative first trimester screen, AFP only today. - AFP, Serum, Open Spina Bifida No other complaints or concerns.  Routine obstetric precautions reviewed. Please refer to After Visit Summary for other counseling recommendations.  Return in about 8 weeks (around 02/11/2017) for 2 hr GTT, 3rd trimester labs, TDap, OB 28 week visit ([employer]ys[employer]s).   Verita Schneiders, MD

## 2016-12-14 ENCOUNTER — Encounter: Payer: Self-pay | Admitting: Radiology

## 2016-12-14 ENCOUNTER — Ambulatory Visit (INDEPENDENT_AMBULATORY_CARE_PROVIDER_SITE_OTHER): Payer: Medicaid Other | Admitting: Obstetrics & Gynecology

## 2016-12-14 VITALS — BP 115/78 | HR 96 | Wt 221.2 lb

## 2016-12-14 DIAGNOSIS — Z3480 Encounter for supervision of other normal pregnancy, unspecified trimester: Secondary | ICD-10-CM

## 2016-12-14 DIAGNOSIS — IMO0002 Reserved for concepts with insufficient information to code with codable children: Secondary | ICD-10-CM

## 2016-12-14 DIAGNOSIS — Z3482 Encounter for supervision of other normal pregnancy, second trimester: Secondary | ICD-10-CM

## 2016-12-14 DIAGNOSIS — Z0489 Encounter for examination and observation for other specified reasons: Secondary | ICD-10-CM

## 2016-12-14 DIAGNOSIS — A749 Chlamydial infection, unspecified: Secondary | ICD-10-CM | POA: Insufficient documentation

## 2016-12-14 DIAGNOSIS — O98812 Other maternal infectious and parasitic diseases complicating pregnancy, second trimester: Secondary | ICD-10-CM

## 2016-12-14 MED ORDER — AZITHROMYCIN 500 MG PO TABS: 1000.0000 mg | ORAL_TABLET | Freq: Once | ORAL | 1 refills | Status: AC

## 2016-12-14 NOTE — Patient Instructions (Addendum)
Return to clinic for any scheduled appointments or obstetric concerns, or go to [facility] for evaluation   Second Trimester of Pregnancy The second trimester is from week 14 through week 27 (months 4 through 6). The second trimester is often a time when you feel your best. Your body has adjusted to being pregnant, and you begin to feel better physically. Usually, morning sickness has lessened or quit completely, you may have more energy, and you may have an increase in appetite. The second trimester is also a time when the fetus is growing rapidly. At the end of the sixth month, the fetus is about 9 inches long and weighs about 1 pounds. You will likely begin to feel the baby move (quickening) between 16 and 20 weeks of pregnancy. Body changes during your second trimester Your body continues to go through many changes during your second trimester. The changes vary from woman to woman.  Your weight will continue to increase. You will notice your lower abdomen bulging out.  You may begin to get stretch marks on your hips, abdomen, and breasts.  You may develop headaches that can be relieved by medicines. The medicines should be approved by your health care provider.  You may urinate more often because the fetus is pressing on your bladder.  You may develop or continue to have heartburn as a result of your pregnancy.  You may develop constipation because certain hormones are causing the muscles that push waste through your intestines to slow down.  You may develop hemorrhoids or swollen, bulging veins (varicose veins).  You may have back pain. This is caused by: ? Weight gain. ? Pregnancy hormones that are relaxing the joints in your pelvis. ? A shift in weight and the muscles that support your balance.  Your breasts will continue to grow and they will continue to become tender.  Your gums may bleed and may be sensitive to brushing and flossing.  Dark spots or blotches (chloasma, mask of  pregnancy) may develop on your face. This will likely fade after the baby is born.  A dark line from your belly button to the pubic area (linea nigra) may appear. This will likely fade after the baby is born.  You may have changes in your hair. These can include thickening of your hair, rapid growth, and changes in texture. Some women also have hair loss during or after pregnancy, or hair that feels dry or thin. Your hair will most likely return to normal after your baby is born.  What to expect at prenatal visits During a routine prenatal visit:  You will be weighed to make sure you and the fetus are growing normally.  Your blood pressure will be taken.  Your abdomen will be measured to track your baby's growth.  The fetal heartbeat will be listened to.  Any test results from the previous visit will be discussed.  Your health care provider may ask you:  How you are feeling.  If you are feeling the baby move.  If you have had any abnormal symptoms, such as leaking fluid, bleeding, severe headaches, or abdominal cramping.  If you are using any tobacco products, including cigarettes, chewing tobacco, and electronic cigarettes.  If you have any questions.  Other tests that may be performed during your second trimester include:  Blood tests that check for: ? Low iron levels (anemia). ? High blood sugar that affects pregnant women (gestational diabetes) between 24 and 28 weeks. ? Rh antibodies. This is to check   for a protein on red blood cells (Rh factor).  Urine tests to check for infections, diabetes, or protein in the urine.  An ultrasound to confirm the proper growth and development of the baby.  An amniocentesis to check for possible genetic problems.  Fetal screens for spina bifida and Down syndrome.  HIV (human immunodeficiency virus) testing. Routine prenatal testing includes screening for HIV, unless you choose not to have this test.  Follow these instructions at  home: Medicines  Follow your health care provider's instructions regarding medicine use. Specific medicines may be either safe or unsafe to take during pregnancy.  Take a prenatal vitamin that contains at least 600 micrograms (mcg) of folic acid.  If you develop constipation, try taking a stool softener if your health care provider approves. Eating and drinking  Eat a balanced diet that includes fresh fruits and vegetables, whole grains, good sources of protein such as meat, eggs, or tofu, and low-fat dairy. Your health care provider will help you determine the amount of weight gain that is right for you.  Avoid raw meat and uncooked cheese. These carry germs that can cause birth defects in the baby.  If you have low calcium intake from food, talk to your health care provider about whether you should take a daily calcium supplement.  Limit foods that are high in fat and processed sugars, such as fried and sweet foods.  To prevent constipation: ? Drink enough fluid to keep your urine clear or pale yellow. ? Eat foods that are high in fiber, such as fresh fruits and vegetables, whole grains, and beans. Activity  Exercise only as directed by your health care provider. Most women can continue their usual exercise routine during pregnancy. Try to exercise for 30 minutes at least 5 days a week. Stop exercising if you experience uterine contractions.  Avoid heavy lifting, wear low heel shoes, and practice good posture.  A sexual relationship may be continued unless your health care provider directs you otherwise. Relieving pain and discomfort  Wear a good support bra to prevent discomfort from breast tenderness.  Take warm sitz baths to soothe any pain or discomfort caused by hemorrhoids. Use hemorrhoid cream if your health care provider approves.  Rest with your legs elevated if you have leg cramps or low back pain.  If you develop varicose veins, wear support hose. Elevate your feet  for 15 minutes, 3-4 times a day. Limit salt in your diet. Prenatal Care  Write down your questions. Take them to your prenatal visits.  Keep all your prenatal visits as told by your health care provider. This is important. Safety  Wear your seat belt at all times when driving.  Make a list of emergency phone numbers, including numbers for family, friends, the hospital, and police and fire departments. General instructions  Ask your health care provider for a referral to a local prenatal education class. Begin classes no later than the beginning of month 6 of your pregnancy.  Ask for help if you have counseling or nutritional needs during pregnancy. Your health care provider can offer advice or refer you to specialists for help with various needs.  Do not use hot tubs, steam rooms, or saunas.  Do not douche or use tampons or scented sanitary pads.  Do not cross your legs for long periods of time.  Avoid cat litter boxes and soil used by cats. These carry germs that can cause birth defects in the baby and possibly loss of the fetus   by miscarriage or stillbirth.  Avoid all smoking, herbs, alcohol, and unprescribed drugs. Chemicals in these products can affect the formation and growth of the baby.  Do not use any products that contain nicotine or tobacco, such as cigarettes and e-cigarettes. If you need help quitting, ask your health care provider.  Visit your dentist if you have not gone yet during your pregnancy. Use a soft toothbrush to brush your teeth and be gentle when you floss. Contact a health care provider if:  You have dizziness.  You have mild pelvic cramps, pelvic pressure, or nagging pain in the abdominal area.  You have persistent nausea, vomiting, or diarrhea.  You have a bad smelling vaginal discharge.  You have pain when you urinate. Get help right away if:  You have a fever.  You are leaking fluid from your vagina.  You have spotting or bleeding from your  vagina.  You have severe abdominal cramping or pain.  You have rapid weight gain or weight loss.  You have shortness of breath with chest pain.  You notice sudden or extreme swelling of your face, hands, ankles, feet, or legs.  You have not felt your baby move in over an hour.  You have severe headaches that do not go away when you take medicine.  You have vision changes. Summary  The second trimester is from week 14 through week 27 (months 4 through 6). It is also a time when the fetus is growing rapidly.  Your body goes through many changes during pregnancy. The changes vary from woman to woman.  Avoid all smoking, herbs, alcohol, and unprescribed drugs. These chemicals affect the formation and growth your baby.  Do not use any tobacco products, such as cigarettes, chewing tobacco, and e-cigarettes. If you need help quitting, ask your health care provider.  Contact your health care provider if you have any questions. Keep all prenatal visits as told by your health care provider. This is important. This information is not intended to replace advice given to you by your health care provider. Make sure you discuss any questions you have with your health care provider. Document Released: 12/26/2000 Document Revised: 06/09/2015 Document Reviewed: 03/04/2012 Elsevier Interactive Patient Education  2017 [employer].   Tdap Vaccine (Tetanus, Diphtheria and Pertussis): What You Need to Know 1. Why get vaccinated? Tetanus, diphtheria and pertussis are very serious diseases. Tdap vaccine can protect [place] from these diseases. And, Tdap vaccine given to pregnant women can protect newborn babies against pertussis. TETANUS (Lockjaw) is rare in the [place] today. It causes painful muscle tightening and stiffness, usually all over the body.  It can lead to tightening of muscles in the head and neck so you can't open your mouth, swallow, or sometimes even breathe. Tetanus kills about 1 out  of 10 people who are infected even after receiving the best medical care.  DIPHTHERIA is also rare in the [place] today. It can cause a thick coating to form in the back of the throat.  It can lead to breathing problems, heart failure, paralysis, and death.  PERTUSSIS (Whooping Cough) causes severe coughing spells, which can cause difficulty breathing, vomiting and disturbed sleep.  It can also lead to weight loss, incontinence, and rib fractures. Up to 2 in 100 adolescents and 5 in 100 adults with pertussis are hospitalized or have complications, which could include pneumonia or death.  These diseases are caused by bacteria. Diphtheria and pertussis are spread from person to person through secretions  from coughing or sneezing. Tetanus enters the body through cuts, scratches, or wounds. Before vaccines, as many as 200,000 cases of diphtheria, 200,000 cases of pertussis, and hundreds of cases of tetanus, were reported in the [place] each year. Since vaccination began, reports of cases for tetanus and diphtheria have dropped by about 99% and for pertussis by about 80%. 2. Tdap vaccine Tdap vaccine can protect adolescents and adults from tetanus, diphtheria, and pertussis. One dose of Tdap is routinely given at age 51 or 66. People who did not get Tdap at that age should get it as soon as possible. Tdap is especially important for healthcare professionals and anyone having close contact with a baby younger than 12 months. Pregnant women should get a dose of Tdap during every pregnancy, to protect the newborn from pertussis. Infants are most at risk for severe, life-threatening complications from pertussis. Another vaccine, called Td, protects against tetanus and diphtheria, but not pertussis. A Td booster should be given every 10 years. Tdap may be given as one of these boosters if you have never gotten Tdap before. Tdap may also be given after a severe cut or burn to prevent tetanus  infection. Your doctor or the person giving you the vaccine can give you more information. Tdap may safely be given at the same time as other vaccines. 3. Some people should not get this vaccine  A person who has ever had a life-threatening allergic reaction after a previous dose of any diphtheria, tetanus or pertussis containing vaccine, OR has a severe allergy to any part of this vaccine, should not get Tdap vaccine. Tell the person giving the vaccine about any severe allergies.  Anyone who had coma or long repeated seizures within 7 days after a childhood dose of DTP or DTaP, or a previous dose of Tdap, should not get Tdap, unless a cause other than the vaccine was found. They can still get Td.  Talk to your doctor if you: ? have seizures or another nervous system problem, ? had severe pain or swelling after any vaccine containing diphtheria, tetanus or pertussis, ? ever had a condition called Guillain-Barr Syndrome (GBS), ? aren't feeling well on the day the shot is scheduled. 4. Risks With any medicine, including vaccines, there is a chance of side effects. These are usually mild and go away on their own. Serious reactions are also possible but are rare. Most people who get Tdap vaccine do not have any problems with it. Mild problems following Tdap: (Did not interfere with activities)  Pain where the shot was given (about 3 in 4 adolescents or 2 in 3 adults)  Redness or swelling where the shot was given (about 1 person in 5)  Mild fever of at least 100.105F (up to about 1 in 25 adolescents or 1 in 100 adults)  Headache (about 3 or 4 people in 10)  Tiredness (about 1 person in 3 or 4)  Nausea, vomiting, diarrhea, stomach ache (up to 1 in 4 adolescents or 1 in 10 adults)  Chills, sore joints (about 1 person in 10)  Body aches (about 1 person in 3 or 4)  Rash, swollen glands (uncommon)  Moderate problems following Tdap: (Interfered with activities, but did not require medical  attention)  Pain where the shot was given (up to 1 in 5 or 6)  Redness or swelling where the shot was given (up to about 1 in 16 adolescents or 1 in 12 adults)  Fever over 102F (about 1 in  100 adolescents or 1 in 250 adults)  Headache (about 1 in 7 adolescents or 1 in 10 adults)  Nausea, vomiting, diarrhea, stomach ache (up to 1 or 3 people in 100)  Swelling of the entire arm where the shot was given (up to about 1 in 500).  Severe problems following Tdap: (Unable to perform usual activities; required medical attention)  Swelling, severe pain, bleeding and redness in the arm where the shot was given (rare).  Problems that could happen after any vaccine:  People sometimes faint after a medical procedure, including vaccination. Sitting or lying down for about 15 minutes can help prevent fainting, and injuries caused by a fall. Tell your doctor if you feel dizzy, or have vision changes or ringing in the ears.  Some people get severe pain in the shoulder and have difficulty moving the arm where a shot was given. This happens very rarely.  Any medication can cause a severe allergic reaction. Such reactions from a vaccine are very rare, estimated at fewer than 1 in a million doses, and would happen within a few minutes to a few hours after the vaccination. As with any medicine, there is a very remote chance of a vaccine causing a serious injury or death. The safety of vaccines is always being monitored. For more information, visit: http://www.aguilar.org/ 5. What if there is a serious problem? What should I look for? Look for anything that concerns you, such as signs of a severe allergic reaction, very high fever, or unusual behavior. Signs of a severe allergic reaction can include hives, swelling of the face and throat, difficulty breathing, a fast heartbeat, dizziness, and weakness. These would usually start a few minutes to a few hours after the vaccination. What should I do?  If  you think it is a severe allergic reaction or other emergency that can't wait, call 9-1-1 or get the person to the nearest hospital. Otherwise, call your doctor.  Afterward, the reaction should be reported to the Vaccine Adverse Event Reporting System (VAERS). Your doctor might file this report, or you can do it yourself through the VAERS web site at www.vaers.SamedayNews.es, or by calling (253)662-7161. ? VAERS does not give medical advice. 6. The National Vaccine Injury Compensation Program The [employer] Vaccine Injury Compensation Program (VICP) is a federal program that was created to compensate people who may have been injured by certain vaccines. Persons who believe they may have been injured by a vaccine can learn about the program and about filing a claim by calling 562-589-2280 or visiting the [place] website at GoldCloset.com.ee. There is a time limit to file a claim for compensation. 7. How can I learn more?  Ask your doctor. He or she can give you the vaccine package insert or suggest other sources of information.  Call your local or state health department.  Contact the Centers for Disease Control and Prevention (CDC): ? Call 715-533-1147 (1-800-CDC-INFO) or ? Visit CDC's website at http://hunter.com/ CDC Tdap Vaccine VIS (03/10/13) This information is not intended to replace advice given to you by your health care provider. Make sure you discuss any questions you have with your health care provider. Document Released: 07/03/2011 Document Revised: 09/22/2015 Document Reviewed: 09/22/2015 Elsevier Interactive Patient Education  2017 [employer].

## 2016-12-19 LAB — AFP, SERUM, OPEN SPINA BIFIDA
AFP MoM: 0.91
AFP Value: 40.3 ng/mL
GEST. AGE ON COLLECTION DATE: 19.4 wk
Maternal Age At EDD: 20.7 yr
OSBR RISK 1 IN: 10000
TEST RESULTS AFP: NEGATIVE
WEIGHT: 221 [lb_av]

## 2016-12-30 ENCOUNTER — Emergency Department (HOSPITAL_COMMUNITY)
Admission: EM | Admit: 2016-12-30 | Discharge: 2016-12-30 | Disposition: A | Discharge status: Home / Self Care | Payer: Medicaid Other | Attending: Emergency Medicine | Admitting: Emergency Medicine

## 2016-12-30 ENCOUNTER — Encounter (HOSPITAL_COMMUNITY): Payer: Self-pay

## 2016-12-30 ENCOUNTER — Other Ambulatory Visit: Payer: Self-pay

## 2016-12-30 DIAGNOSIS — Y9389 Activity, other specified: Secondary | ICD-10-CM | POA: Insufficient documentation

## 2016-12-30 DIAGNOSIS — T192XXA Foreign body in vulva and vagina, initial encounter: Secondary | ICD-10-CM

## 2016-12-30 DIAGNOSIS — Y998 Other external cause status: Secondary | ICD-10-CM | POA: Insufficient documentation

## 2016-12-30 DIAGNOSIS — Y929 Unspecified place or not applicable: Secondary | ICD-10-CM | POA: Insufficient documentation

## 2016-12-30 DIAGNOSIS — X58XXXA Exposure to other specified factors, initial encounter: Secondary | ICD-10-CM | POA: Insufficient documentation

## 2016-12-30 NOTE — ED Provider Notes (Signed)
[facility] Provider Note   CSN: [id number] Arrival date & time: 12/30/16  0005     History   Chief Complaint Chief Complaint  Patient presents with  . Foreign Body in Vagina    HPI Heather Durham is a G1P0000 who is [redacted] weeks pregnant 21 y.o. female who presents to the emergency department with a chief complaint of foreign body in vagina.  The patient reports that she was having sex with her significant other prior to arrival and was unable to locate the condom after they finished having intercourse. She attempted to squat and cough at home to remove the condom, but was unsuccessful.  She denies vaginal bleeding, vaginal discharge, pelvic, or abdominal pain.  The history is provided by the patient. No language interpreter was used.    Past Medical History:  Diagnosis Date  . Chlamydia   . Trichomonas infection   . UTI (urinary tract infection)     Patient Active Problem List   Diagnosis Date Noted  . Chlamydia infection affecting pregnancy in second trimester 12/14/2016  . Supervision of other normal pregnancy, antepartum 10/09/2016  . Obesity in pregnancy, antepartum 10/09/2016    Past Surgical History:  Procedure Laterality Date  . FOOT SURGERY Right     OB History    Gravida Para Term Preterm AB Living   1 0 0 0 0 0   SAB TAB Ectopic Multiple Live Births   0 0 0 0 0       Home Medications    Prior to Admission medications   Medication Sig Start Date End Date Taking? Authorizing Provider  metroNIDAZOLE (FLAGYL) 500 MG tablet Take 1 tablet (500 mg total) 2 (two) times daily by mouth. Patient not taking: Reported on 12/14/2016 11/22/16   Julianne Handler, CNM  Prenatal Vit-Fe Fumarate-FA (PRENATAL VITAMIN) 27-0.8 MG TABS Take 1 tablet by mouth daily. 09/06/16   Melynda Ripple, MD    Family History No family history on file.  Social History Social History   Tobacco Use  . Smoking status: Never Smoker  .  Smokeless tobacco: Never Used  Substance Use Topics  . Alcohol use: No  . Drug use: No     Allergies   Patient has no known allergies.   Review of Systems Review of Systems  Genitourinary:       Foreign body in vagina     Physical Exam Updated Vital Signs BP 124/83   Pulse (!) 114   Temp 99.6 F (37.6 C)   Resp 20   LMP 07/26/2016   SpO2 100%   Physical Exam  Constitutional: No distress.  HENT:  Head: Normocephalic.  Eyes: Conjunctivae are normal.  Neck: Neck supple.  Cardiovascular: Normal rate and regular rhythm. Exam reveals no gallop and no friction rub.  No murmur heard. Pulmonary/Chest: Effort normal. No respiratory distress.  Abdominal: Soft. She exhibits no distension.  Genitourinary:  Genitourinary Comments: Chaperoned exam. FB, consistent with a condom noted in the posterior vaginal vault.  No gross blood, discharge, or tenderness noted on exam.  Neurological: She is alert.  Skin: Skin is warm. No rash noted.  Psychiatric: Her behavior is normal.  Nursing note and vitals reviewed.    ED Treatments / Results  Labs (all labs ordered are listed, but only abnormal results are displayed) Labs Reviewed - No data to display  EKG  EKG Interpretation None       Radiology No results found.  Procedures .Foreign Body  Removal Date/Time: 12/30/2016 12:38 AM Performed by: Joanne Gavel, PA-C Authorized by: Joanne Gavel, PA-C  Consent: Verbal consent obtained. Consent given by: patient Patient understanding: patient states understanding of the procedure being performed Patient identity confirmed: verbally with patient Body area: vagina 1 objects recovered. Objects recovered: condom Post-procedure assessment: foreign body removed   (including critical care time)  Medications Ordered in ED Medications - No data to display   Initial Impression / Assessment and Plan / ED Course  I have reviewed the triage vital signs and the nursing  notes.  Pertinent labs & imaging results that were available during my care of the patient were reviewed by me and considered in my medical decision making (see chart for details).     21 year old female is a G1P0000 currently [redacted] weeks pregnant who presents to the emergency department with a chief complaint of foreign body in vagina.  On chaperoned speculum exam, foreign body was noted in the posterior vaginal vault was successfully removed with alligator clamps.  The patient has no other complaints at this time.  She is hemodynamically stable and in no acute distress.  The patient is safe for discharge at this time.  Final Clinical Impressions(s) / ED Diagnoses   Final diagnoses:  Foreign body in vagina, initial encounter    ED Discharge Orders    None       Joanne Gavel, PA-C 12/30/16 0040    Ezequiel Essex, MD 12/30/16 [phone]

## 2016-12-30 NOTE — Discharge Instructions (Signed)
If you develop any pelvic pain or cramping, vaginal bleeding, vaginal discharge, please follow-up with your OB/GYN.

## 2016-12-30 NOTE — ED Triage Notes (Signed)
Pt also adds that she is [redacted] weeks pregnant.

## 2016-12-30 NOTE — ED Triage Notes (Signed)
Pt states that about an hour ago she was having intercourse and unable to locate condom. Denies pain

## 2017-01-09 ENCOUNTER — Ambulatory Visit (HOSPITAL_COMMUNITY)
Admission: RE | Admit: 2017-01-09 | Discharge: 2017-01-09 | Disposition: A | Discharge status: Home / Self Care | Payer: Medicaid Other | Source: Ambulatory Visit | Attending: Obstetrics & Gynecology | Admitting: Obstetrics & Gynecology

## 2017-01-09 DIAGNOSIS — O99212 Obesity complicating pregnancy, second trimester: Secondary | ICD-10-CM | POA: Diagnosis not present

## 2017-01-09 DIAGNOSIS — Z362 Encounter for other antenatal screening follow-up: Secondary | ICD-10-CM | POA: Insufficient documentation

## 2017-01-09 DIAGNOSIS — Z3A23 23 weeks gestation of pregnancy: Secondary | ICD-10-CM | POA: Insufficient documentation

## 2017-01-09 DIAGNOSIS — Z0489 Encounter for examination and observation for other specified reasons: Secondary | ICD-10-CM | POA: Diagnosis present

## 2017-01-09 DIAGNOSIS — IMO0002 Reserved for concepts with insufficient information to code with codable children: Secondary | ICD-10-CM

## 2017-01-15 NOTE — L&D Delivery Note (Signed)
Delivery Note At 3:31 PM a viable and healthy female was delivered via Vaginal, Spontaneous (Presentation: ROA).  APGAR: 6, 9; weight 9 lb 1 oz (4110 g).   Placenta status: spontaneous, intact .  Cord: 3 vessel with the following complications: none .    Anesthesia:  epidural Episiotomy: None Lacerations: 2nd degree;Sulcus Suture Repair: vicryl rapide Est. Blood Loss (mL): 100  Mom to postpartum.  Baby to Couplet care / Skin to Skin.  Heather Durham is a 22 y.o. female G1P0000 with IUP at [redacted]w[redacted]d admitted for IOL for postdates .  She progressed with augmentation to complete and pushed ~ 10 minutes to deliver.  Cord clamping delayed by 1 minute then clamped by CNM and cut by family member.  Placenta intact and spontaneous, bleeding minimal.  Second degree laceration of posterior vaginal wall/sulcus repaired without difficulty.  Mom and baby stable prior to transfer to postpartum. She plans on breastfeeding. She requests POPs for birth control.   Fatima Blank 05/15/2017, 4:43 PM

## 2017-01-22 ENCOUNTER — Inpatient Hospital Stay (HOSPITAL_COMMUNITY)
Admission: AD | Admit: 2017-01-22 | Discharge: 2017-01-22 | Disposition: A | Discharge status: Home / Self Care | Payer: Medicaid Other | Source: Ambulatory Visit | Attending: Family Medicine | Admitting: Family Medicine

## 2017-01-22 ENCOUNTER — Encounter (HOSPITAL_COMMUNITY): Payer: Self-pay | Admitting: *Deleted

## 2017-01-22 DIAGNOSIS — R030 Elevated blood-pressure reading, without diagnosis of hypertension: Secondary | ICD-10-CM | POA: Diagnosis not present

## 2017-01-22 DIAGNOSIS — Z9889 Other specified postprocedural states: Secondary | ICD-10-CM | POA: Insufficient documentation

## 2017-01-22 DIAGNOSIS — Z3480 Encounter for supervision of other normal pregnancy, unspecified trimester: Secondary | ICD-10-CM

## 2017-01-22 DIAGNOSIS — O162 Unspecified maternal hypertension, second trimester: Secondary | ICD-10-CM | POA: Diagnosis not present

## 2017-01-22 DIAGNOSIS — Z79899 Other long term (current) drug therapy: Secondary | ICD-10-CM | POA: Diagnosis not present

## 2017-01-22 DIAGNOSIS — Z3A25 25 weeks gestation of pregnancy: Secondary | ICD-10-CM | POA: Insufficient documentation

## 2017-01-22 DIAGNOSIS — O9989 Other specified diseases and conditions complicating pregnancy, childbirth and the puerperium: Secondary | ICD-10-CM | POA: Diagnosis not present

## 2017-01-22 LAB — URINALYSIS, ROUTINE W REFLEX MICROSCOPIC
BILIRUBIN URINE: NEGATIVE
GLUCOSE, UA: NEGATIVE mg/dL
Hgb urine dipstick: NEGATIVE
Ketones, ur: 20 mg/dL — AB
Leukocytes, UA: NEGATIVE
NITRITE: NEGATIVE
PH: 6 (ref 5.0–8.0)
Protein, ur: NEGATIVE mg/dL
SPECIFIC GRAVITY, URINE: 1.01 (ref 1.005–1.030)

## 2017-01-22 NOTE — Discharge Instructions (Signed)
Hypertension During Pregnancy Hypertension is also called high blood pressure. High blood pressure means that the force of your blood moving in your body is too strong. When you are pregnant, this condition should be watched carefully. It can cause problems for you and your baby. Follow these instructions at home: Eating and drinking  Drink enough fluid to keep your pee (urine) clear or pale yellow.  Eat healthy foods that are low in salt (sodium). ? Do not add salt to your food. ? Check labels on foods and drinks to see much salt is in them. Look on the label where you see "Sodium." Lifestyle  Do not use any products that contain nicotine or tobacco, such as cigarettes and e-cigarettes. If you need help quitting, ask your doctor.  Do not use alcohol.  Avoid caffeine.  Avoid stress. Rest and get plenty of sleep. General instructions  Take over-the-counter and prescription medicines only as told by your doctor.  While lying down, lie on your left side. This keeps pressure off your baby.  While sitting or lying down, raise (elevate) your feet. Try putting some pillows under your lower legs.  Exercise regularly. Ask your doctor what kinds of exercise are best for you.  Keep all prenatal and follow-up visits as told by your doctor. This is important. Contact a doctor if:  You have symptoms that your doctor told you to watch for, such as: ? Fever. ? Throwing up (vomiting). ? Headache. Get help right away if:  You have very bad pain in your belly (abdomen).  You are throwing up, and this does not get better with treatment.  You suddenly get swelling in your hands, ankles, or face.  You gain 4 lb (1.8 kg) or more in 1 week.  You get bleeding from your vagina.  You have blood in your pee.  You do not feel your baby moving as much as normal.  You have a change in vision.  You have muscle twitching or sudden tightening (spasms).  You have trouble breathing.  Your lips  or fingernails turn blue. This information is not intended to replace advice given to you by your health care provider. Make sure you discuss any questions you have with your health care provider. Document Released: 02/03/2010 Document Revised: 09/13/2015 Document Reviewed: 09/13/2015 Elsevier Interactive Patient Education  2018 [place] Medications in Pregnancy   Acne: Benzoyl Peroxide Salicylic Acid  Backache/Headache: Tylenol: 2 regular strength every 4 hours OR              2 Extra strength every 6 hours  Colds/Coughs/Allergies: Benadryl (alcohol free) 25 mg every 6 hours as needed Breath right strips Claritin Cepacol throat lozenges Chloraseptic throat spray Cold-Eeze- up to three times per day Cough drops, alcohol free Flonase (by prescription only) Guaifenesin Mucinex Robitussin DM (plain only, alcohol free) Saline nasal spray/drops Sudafed (pseudoephedrine) & Actifed ** use only after [redacted] weeks gestation and if you do not have high blood pressure Tylenol Vicks Vaporub Zinc lozenges Zyrtec   Constipation: Colace Ducolax suppositories Fleet enema Glycerin suppositories Metamucil Milk of magnesia Miralax Senokot Smooth move tea  Diarrhea: Kaopectate Imodium A-D  *NO pepto Bismol  Hemorrhoids: Anusol Anusol HC Preparation H Tucks  Indigestion: Tums Maalox Mylanta Zantac  Pepcid  Insomnia: Benadryl (alcohol free) 25mg  every 6 hours as needed Tylenol PM Unisom, no Gelcaps  Leg Cramps: Tums MagGel  Nausea/Vomiting:  Bonine Dramamine Emetrol Ginger extract Sea bands Meclizine  Nausea medication to take during  pregnancy:  Unisom (doxylamine succinate 25 mg tablets) Take one tablet daily at bedtime. If symptoms are not adequately controlled, the dose can be increased to a maximum recommended dose of two tablets daily (1/2 tablet in the morning, 1/2 tablet mid-afternoon and one at bedtime). Vitamin B6 100mg  tablets. Take one  tablet twice a day (up to 200 mg per day).  Skin Rashes: Aveeno products Benadryl cream or 25mg  every 6 hours as needed Calamine Lotion 1% cortisone cream  Yeast infection: Gyne-lotrimin 7 Monistat 7   **If taking multiple medications, please check labels to avoid duplicating the same active ingredients **take medication as directed on the label ** Do not exceed 4000 mg of tylenol in 24 hours **Do not take medications that contain aspirin or ibuprofen

## 2017-01-22 NOTE — MAU Note (Signed)
Pt states she is [facility] user, states she recorded BP and was told that her BP was too high. States BP was 116/90 and then 113/89. Was told to come in. Pt states had a HA yesterday but does not have one currently. Pt denies vision changes or abdominal pain. Pt denies vaginal bleeding or LOF. Reports good fetal movement.

## 2017-01-22 NOTE — MAU Note (Signed)
Pt was told by Baby scripts nurse to be see in MAU for BP

## 2017-01-22 NOTE — MAU Provider Note (Signed)
History     CSN: [id number]  Arrival date and time: 01/22/17 2037   First Provider Initiated Contact with Patient 01/22/17 2130     Chief Complaint  Patient presents with  . Hypertension   HPI Heather Durham is a 22 y.o. G1P0000 at [redacted]w[redacted]d who presents stating her baby scripts nurse told her to come in. She states she took her blood pressure and it was 118/90 and the recheck was 113/89. She denies any headache, visual changes or epigastric pain. She reports good fetal movement. Denies any vaginal bleeding or leaking of fluid. No history of hypertension during this pregnancy.  OB History    Gravida Para Term Preterm AB Living   1 0 0 0 0 0   SAB TAB Ectopic Multiple Live Births   0 0 0 0 0      Past Medical History:  Diagnosis Date  . Chlamydia   . Trichomonas infection   . UTI (urinary tract infection)     Past Surgical History:  Procedure Laterality Date  . FOOT SURGERY Right     No family history on file.  Social History   Tobacco Use  . Smoking status: Never Smoker  . Smokeless tobacco: Never Used  Substance Use Topics  . Alcohol use: No  . Drug use: No    Allergies: No Known Allergies  Medications Prior to Admission  Medication Sig Dispense Refill Last Dose  . metroNIDAZOLE (FLAGYL) 500 MG tablet Take 1 tablet (500 mg total) 2 (two) times daily by mouth. (Patient not taking: Reported on 12/14/2016) 14 tablet 0 Not Taking  . Prenatal Vit-Fe Fumarate-FA (PRENATAL VITAMIN) 27-0.8 MG TABS Take 1 tablet by mouth daily. 30 tablet 1 Taking    Review of Systems  Constitutional: Negative.  Negative for fatigue and fever.  HENT: Negative.   Respiratory: Negative.  Negative for shortness of breath.   Cardiovascular: Negative.  Negative for chest pain.  Gastrointestinal: Negative.  Negative for abdominal pain, constipation, diarrhea, nausea and vomiting.  Genitourinary: Negative.  Negative for dysuria, vaginal bleeding and vaginal discharge.  Neurological:  Negative.  Negative for dizziness and headaches.   Physical Exam   Blood pressure 123/77, pulse (!) 107, temperature 98.3 F (36.8 C), temperature source Oral, resp. rate 16, height 5\' 8"  (1.727 m), weight 231 lb (104.8 kg), last menstrual period 07/26/2016, SpO2 100 %.  Physical Exam  Nursing note and vitals reviewed. Constitutional: She is oriented to person, place, and time. She appears well-developed and well-nourished. No distress.  HENT:  Head: Normocephalic.  Eyes: Pupils are equal, round, and reactive to light.  Cardiovascular: Normal rate, regular rhythm and normal heart sounds.  Respiratory: Effort normal and breath sounds normal. No respiratory distress.  GI: Soft. Bowel sounds are normal. She exhibits no distension. There is no tenderness.  Neurological: She is alert and oriented to person, place, and time. She has normal reflexes. She displays normal reflexes. No cranial nerve deficit.  Skin: Skin is warm and dry.  Psychiatric: She has a normal mood and affect. Her behavior is normal. Judgment and thought content normal.   Fetal Tracing:  Baseline: 130 Variability: moderate Accels: 10x10 Decels: none  Toco: none  Patient Vitals for the past 24 hrs:  BP Temp Temp src Pulse Resp SpO2 Height Weight  01/22/17 2130 123/77 - - (!) 107 - - - -  01/22/17 2129 - - - (!) 101 - - - -  01/22/17 2125 124/67 - - (!) 114 - - - -  01/22/17 2109 120/77 98.3 F (36.8 C) Oral (!) 101 16 100 % 5\' 8"  (1.727 m) 231 lb (104.8 kg)    MAU Course  Procedures  MDM Reassuring NST No signs or symptoms of preeclampsia, VS normal in MAU Assessment and Plan   1. Elevated BP without diagnosis of hypertension   2. Supervision of other normal pregnancy, antepartum   3. [redacted] weeks gestation of pregnancy    -Discharge home in stable condition -Preeclampsia precautions discussed -Patient advised to follow-up with [facility] as scheduled for prenatal care -Patient may return to MAU as  needed or if her condition were to change or worsen  Wende Mott CNM 01/22/2017, 9:30 PM

## 2017-01-23 ENCOUNTER — Telehealth: Payer: Self-pay | Admitting: *Deleted

## 2017-01-23 NOTE — Telephone Encounter (Signed)
Rcvd trigger email from [place].   Dike Operator,  It looks like the [facility] patient at the clinic below still has elevated blood pressure.  Please call the clinic, following the detailed protocol provided. Questionnaire: Are you experiencing any abdominal pain? No  Are you experiencing any nausea or vomiting? No  Have you experienced any swelling of your hands or face? No  Have you noticed any change in vision? No  Have you been experiencing headaches? Yes  Blood Pressure Original: 116/90 -  Blood Pressure (Retakes): - 113/89   I called pt to follow up but no answer - LM for pt to rtn call.  According to chart - pt was seen in MAU for elevated BP per BRX recommendations. Pt advised in MAU that she needs to follow up with [facility] at next scheduled ROB

## 2017-02-08 ENCOUNTER — Encounter: Payer: Self-pay | Admitting: Radiology

## 2017-02-08 ENCOUNTER — Other Ambulatory Visit: Payer: Self-pay | Admitting: Obstetrics & Gynecology

## 2017-02-08 ENCOUNTER — Ambulatory Visit (INDEPENDENT_AMBULATORY_CARE_PROVIDER_SITE_OTHER): Payer: Medicaid Other | Admitting: Obstetrics & Gynecology

## 2017-02-08 ENCOUNTER — Encounter: Payer: Self-pay | Admitting: Obstetrics & Gynecology

## 2017-02-08 ENCOUNTER — Other Ambulatory Visit (HOSPITAL_COMMUNITY)
Admission: RE | Admit: 2017-02-08 | Discharge: 2017-02-08 | Disposition: A | Discharge status: Home / Self Care | Payer: Medicaid Other | Source: Ambulatory Visit | Attending: Obstetrics & Gynecology | Admitting: Obstetrics & Gynecology

## 2017-02-08 VITALS — BP 123/79 | HR 98 | Wt 231.0 lb

## 2017-02-08 DIAGNOSIS — Z23 Encounter for immunization: Secondary | ICD-10-CM | POA: Diagnosis not present

## 2017-02-08 DIAGNOSIS — A749 Chlamydial infection, unspecified: Secondary | ICD-10-CM

## 2017-02-08 DIAGNOSIS — Z3A00 Weeks of gestation of pregnancy not specified: Secondary | ICD-10-CM | POA: Insufficient documentation

## 2017-02-08 DIAGNOSIS — Z3482 Encounter for supervision of other normal pregnancy, second trimester: Secondary | ICD-10-CM

## 2017-02-08 DIAGNOSIS — O98812 Other maternal infectious and parasitic diseases complicating pregnancy, second trimester: Secondary | ICD-10-CM | POA: Insufficient documentation

## 2017-02-08 DIAGNOSIS — Z3480 Encounter for supervision of other normal pregnancy, unspecified trimester: Secondary | ICD-10-CM

## 2017-02-08 NOTE — Progress Notes (Signed)
   PRENATAL VISIT NOTE  Subjective:  Heather Durham is a 22 y.o. G1P0000 at [redacted]w[redacted]d being seen today for ongoing prenatal care.  She is currently monitored for the following issues for this low-risk pregnancy and has Supervision of other normal pregnancy, antepartum; Obesity in pregnancy, antepartum; and Chlamydia infection affecting pregnancy in second trimester on their problem list.  Patient reports no complaints.  Contractions: Not present. Vag. Bleeding: None.  Movement: Present. Denies leaking of fluid.   The following portions of the patient's history were reviewed and updated as appropriate: allergies, current medications, past family history, past medical history, past social history, past surgical history and problem list. Problem list updated.  Objective:   Vitals:   02/08/17 0827  BP: 123/79  Pulse: 98  Weight: 231 lb (104.8 kg)    Fetal Status: Fetal Heart Rate (bpm): 127 Fundal Height: 28 cm Movement: Present     General:  Alert, oriented and cooperative. Patient is in no acute distress.  Skin: Skin is warm and dry. No rash noted.   Cardiovascular: Normal heart rate noted  Respiratory: Normal respiratory effort, no problems with respiration noted  Abdomen: Soft, gravid, appropriate for gestational age.  Pain/Pressure: Absent     Pelvic: Cervical exam deferred        Extremities: Normal range of motion.  Edema: None  Mental Status:  Normal mood and affect. Normal behavior. Normal judgment and thought content.   Assessment and Plan:  Pregnancy: G1P0000 at [redacted]w[redacted]d  1. Chlamydia infection affecting pregnancy in second trimester TOC done today. - Cervicovaginal ancillary only  2. Supervision of other normal pregnancy, antepartum Third trimester labs and Tdap today. - CBC - RPR - HIV antibody - Glucose Tolerance, 2 Hours w/1 Hour - Tdap vaccine  Preterm labor symptoms and general obstetric precautions including but not limited to vaginal bleeding, contractions,  leaking of fluid and fetal movement were reviewed in detail with the patient. Please refer to After Visit Summary for other counseling recommendations.  Return in about 4 weeks (around 03/08/2017) for OB 32 week visit ([facility]).   Verita Schneiders, MD

## 2017-02-08 NOTE — Patient Instructions (Signed)
Return to clinic for any scheduled appointments or obstetric concerns, or go to MAU for evaluation   Third Trimester of Pregnancy The third trimester is from week 28 through week 40 (months 7 through 9). The third trimester is a time when the unborn baby (fetus) is growing rapidly. At the end of the ninth month, the fetus is about 20 inches in length and weighs 6-10 pounds. Body changes during your third trimester Your body will continue to go through many changes during pregnancy. The changes vary from woman to woman. During the third trimester:  Your weight will continue to increase. You can expect to gain 25-35 pounds (11-16 kg) by the end of the pregnancy.  You may begin to get stretch marks on your hips, abdomen, and breasts.  You may urinate more often because the fetus is moving lower into your pelvis and pressing on your bladder.  You may develop or continue to have heartburn. This is caused by increased hormones that slow down muscles in the digestive tract.  You may develop or continue to have constipation because increased hormones slow digestion and cause the muscles that push waste through your intestines to relax.  You may develop hemorrhoids. These are swollen veins (varicose veins) in the rectum that can itch or be painful.  You may develop swollen, bulging veins (varicose veins) in your legs.  You may have increased body aches in the pelvis, back, or thighs. This is due to weight gain and increased hormones that are relaxing your joints.  You may have changes in your hair. These can include thickening of your hair, rapid growth, and changes in texture. Some women also have hair loss during or after pregnancy, or hair that feels dry or thin. Your hair will most likely return to normal after your baby is born.  Your breasts will continue to grow and they will continue to become tender. A yellow fluid (colostrum) may leak from your breasts. This is the first milk you are  producing for your baby.  Your belly button may stick out.  You may notice more swelling in your hands, face, or ankles.  You may have increased tingling or numbness in your hands, arms, and legs. The skin on your belly may also feel numb.  You may feel short of breath because of your expanding uterus.  You may have more problems sleeping. This can be caused by the size of your belly, increased need to urinate, and an increase in your body's metabolism.  You may notice the fetus "dropping," or moving lower in your abdomen (lightening).  You may have increased vaginal discharge.  You may notice your joints feel loose and you may have pain around your pelvic bone.  What to expect at prenatal visits You will have prenatal exams every 2 weeks until week 36. Then you will have weekly prenatal exams. During a routine prenatal visit:  You will be weighed to make sure you and the baby are growing normally.  Your blood pressure will be taken.  Your abdomen will be measured to track your baby's growth.  The fetal heartbeat will be listened to.  Any test results from the previous visit will be discussed.  You may have a cervical check near your due date to see if your cervix has softened or thinned (effaced).  You will be tested for Group B streptococcus. This happens between 35 and 37 weeks.  Your health care provider may ask you:  What your birth plan is.    How you are feeling.  If you are feeling the baby move.  If you have had any abnormal symptoms, such as leaking fluid, bleeding, severe headaches, or abdominal cramping.  If you are using any tobacco products, including cigarettes, chewing tobacco, and electronic cigarettes.  If you have any questions.  Other tests or screenings that may be performed during your third trimester include:  Blood tests that check for low iron levels (anemia).  Fetal testing to check the health, activity level, and growth of the fetus.  Testing is done if you have certain medical conditions or if there are problems during the pregnancy.  Nonstress test (NST). This test checks the health of your baby to make sure there are no signs of problems, such as the baby not getting enough oxygen. During this test, a belt is placed around your belly. The baby is made to move, and its heart rate is monitored during movement.  What is false labor? False labor is a condition in which you feel small, irregular tightenings of the muscles in the womb (contractions) that usually go away with rest, changing position, or drinking water. These are called Braxton Hicks contractions. Contractions may last for hours, days, or even weeks before true labor sets in. If contractions come at regular intervals, become more frequent, increase in intensity, or become painful, you should see your health care provider. What are the signs of labor?  Abdominal cramps.  Regular contractions that start at 10 minutes apart and become stronger and more frequent with time.  Contractions that start on the top of the uterus and spread down to the lower abdomen and back.  Increased pelvic pressure and dull back pain.  A watery or bloody mucus discharge that comes from the vagina.  Leaking of amniotic fluid. This is also known as your "water breaking." It could be a slow trickle or a gush. Let your health care provider know if it has a color or strange odor. If you have any of these signs, call your health care provider right away, even if it is before your due date. Follow these instructions at home: Medicines  Follow your health care provider's instructions regarding medicine use. Specific medicines may be either safe or unsafe to take during pregnancy.  Take a prenatal vitamin that contains at least 600 micrograms (mcg) of folic acid.  If you develop constipation, try taking a stool softener if your health care provider approves. Eating and drinking  Eat a  balanced diet that includes fresh fruits and vegetables, whole grains, good sources of protein such as meat, eggs, or tofu, and low-fat dairy. Your health care provider will help you determine the amount of weight gain that is right for you.  Avoid raw meat and uncooked cheese. These carry germs that can cause birth defects in the baby.  If you have low calcium intake from food, talk to your health care provider about whether you should take a daily calcium supplement.  Eat four or five small meals rather than three large meals a day.  Limit foods that are high in fat and processed sugars, such as fried and sweet foods.  To prevent constipation: ? Drink enough fluid to keep your urine clear or pale yellow. ? Eat foods that are high in fiber, such as fresh fruits and vegetables, whole grains, and beans. Activity  Exercise only as directed by your health care provider. Most women can continue their usual exercise routine during pregnancy. Try to exercise for 30   minutes at least 5 days a week. Stop exercising if you experience uterine contractions.  Avoid heavy lifting.  Do not exercise in extreme heat or humidity, or at high altitudes.  Wear low-heel, comfortable shoes.  Practice good posture.  You may continue to have sex unless your health care provider tells you otherwise. Relieving pain and discomfort  Take frequent breaks and rest with your legs elevated if you have leg cramps or low back pain.  Take warm sitz baths to soothe any pain or discomfort caused by hemorrhoids. Use hemorrhoid cream if your health care provider approves.  Wear a good support bra to prevent discomfort from breast tenderness.  If you develop varicose veins: ? Wear support pantyhose or compression stockings as told by your healthcare provider. ? Elevate your feet for 15 minutes, 3-4 times a day. Prenatal care  Write down your questions. Take them to your prenatal visits.  Keep all your prenatal  visits as told by your health care provider. This is important. Safety  Wear your seat belt at all times when driving.  Make a list of emergency phone numbers, including numbers for family, friends, the hospital, and police and fire departments. General instructions  Avoid cat litter boxes and soil used by cats. These carry germs that can cause birth defects in the baby. If you have a cat, ask someone to clean the litter box for you.  Do not travel far distances unless it is absolutely necessary and only with the approval of your health care provider.  Do not use hot tubs, steam rooms, or saunas.  Do not drink alcohol.  Do not use any products that contain nicotine or tobacco, such as cigarettes and e-cigarettes. If you need help quitting, ask your health care provider.  Do not use any medicinal herbs or unprescribed drugs. These chemicals affect the formation and growth of the baby.  Do not douche or use tampons or scented sanitary pads.  Do not cross your legs for long periods of time.  To prepare for the arrival of your baby: ? Take prenatal classes to understand, practice, and ask questions about labor and delivery. ? Make a trial run to the hospital. ? Visit the hospital and tour the maternity area. ? Arrange for maternity or paternity leave through employers. ? Arrange for family and friends to take care of pets while you are in the hospital. ? Purchase a rear-facing car seat and make sure you know how to install it in your car. ? Pack your hospital bag. ? Prepare the baby's nursery. Make sure to remove all pillows and stuffed animals from the baby's crib to prevent suffocation.  Visit your dentist if you have not gone during your pregnancy. Use a soft toothbrush to brush your teeth and be gentle when you floss. Contact a health care provider if:  You are unsure if you are in labor or if your water has broken.  You become dizzy.  You have mild pelvic cramps, pelvic  pressure, or nagging pain in your abdominal area.  You have lower back pain.  You have persistent nausea, vomiting, or diarrhea.  You have an unusual or bad smelling vaginal discharge.  You have pain when you urinate. Get help right away if:  Your water breaks before 37 weeks.  You have regular contractions less than 5 minutes apart before 37 weeks.  You have a fever.  You are leaking fluid from your vagina.  You have spotting or bleeding from your vagina.    You have severe abdominal pain or cramping.  You have rapid weight loss or weight gain.  You have shortness of breath with chest pain.  You notice sudden or extreme swelling of your face, hands, ankles, feet, or legs.  Your baby makes fewer than 10 movements in 2 hours.  You have severe headaches that do not go away when you take medicine.  You have vision changes. Summary  The third trimester is from week 28 through week 40, months 7 through 9. The third trimester is a time when the unborn baby (fetus) is growing rapidly.  During the third trimester, your discomfort may increase as you and your baby continue to gain weight. You may have abdominal, leg, and back pain, sleeping problems, and an increased need to urinate.  During the third trimester your breasts will keep growing and they will continue to become tender. A yellow fluid (colostrum) may leak from your breasts. This is the first milk you are producing for your baby.  False labor is a condition in which you feel small, irregular tightenings of the muscles in the womb (contractions) that eventually go away. These are called Braxton Hicks contractions. Contractions may last for hours, days, or even weeks before true labor sets in.  Signs of labor can include: abdominal cramps; regular contractions that start at 10 minutes apart and become stronger and more frequent with time; watery or bloody mucus discharge that comes from the vagina; increased pelvic pressure  and dull back pain; and leaking of amniotic fluid. This information is not intended to replace advice given to you by your health care provider. Make sure you discuss any questions you have with your health care provider. Document Released: 12/26/2000 Document Revised: 06/09/2015 Document Reviewed: 03/04/2012 Elsevier Interactive Patient Education  2017 [employer]  

## 2017-02-09 LAB — CBC
HEMOGLOBIN: 11.5 g/dL (ref 11.1–15.9)
Hematocrit: 34.8 % (ref 34.0–46.6)
MCH: 29.9 pg (ref 26.6–33.0)
MCHC: 33 g/dL (ref 31.5–35.7)
MCV: 91 fL (ref 79–97)
PLATELETS: 245 10*3/uL (ref 150–379)
RBC: 3.84 x10E6/uL (ref 3.77–5.28)
RDW: 13.5 % (ref 12.3–15.4)
WBC: 9.8 10*3/uL (ref 3.4–10.8)

## 2017-02-09 LAB — RPR: RPR: NONREACTIVE

## 2017-02-09 LAB — GLUCOSE TOLERANCE, 2 HOURS W/ 1HR
GLUCOSE, 1 HOUR: 109 mg/dL (ref 65–179)
GLUCOSE, 2 HOUR: 97 mg/dL (ref 65–152)
Glucose, Fasting: 66 mg/dL (ref 65–91)

## 2017-02-09 LAB — HIV ANTIBODY (ROUTINE TESTING W REFLEX): HIV SCREEN 4TH GENERATION: NONREACTIVE

## 2017-02-18 LAB — URINE CYTOLOGY ANCILLARY ONLY
CHLAMYDIA, DNA PROBE: NEGATIVE
NEISSERIA GONORRHEA: NEGATIVE
Trichomonas: NEGATIVE

## 2017-03-06 ENCOUNTER — Telehealth: Payer: Self-pay | Admitting: Radiology

## 2017-03-06 ENCOUNTER — Encounter: Payer: Medicaid Other | Admitting: Family Medicine

## 2017-03-06 NOTE — Telephone Encounter (Signed)
Left voicemail for patient to [facility] need to reschedule appt, Dr Ernestina Patches had family emergency and needed to reschedule/

## 2017-03-06 NOTE — Telephone Encounter (Signed)
Left message on the cell phone voicemail to call [facility] to reschedule appt Dr Ernestina Patches needed to to leave due to family emergency

## 2017-03-13 ENCOUNTER — Encounter: Payer: Self-pay | Admitting: Radiology

## 2017-03-13 ENCOUNTER — Ambulatory Visit (INDEPENDENT_AMBULATORY_CARE_PROVIDER_SITE_OTHER): Payer: Medicaid Other | Admitting: Student

## 2017-03-13 ENCOUNTER — Encounter: Payer: Self-pay | Admitting: Student

## 2017-03-13 ENCOUNTER — Other Ambulatory Visit (HOSPITAL_COMMUNITY)
Admission: RE | Admit: 2017-03-13 | Discharge: 2017-03-13 | Disposition: A | Discharge status: Home / Self Care | Payer: Medicaid Other | Source: Ambulatory Visit | Attending: Student | Admitting: Student

## 2017-03-13 VITALS — BP 120/80 | HR 106 | Wt 241.0 lb

## 2017-03-13 DIAGNOSIS — F5089 Other specified eating disorder: Secondary | ICD-10-CM

## 2017-03-13 DIAGNOSIS — Z3480 Encounter for supervision of other normal pregnancy, unspecified trimester: Secondary | ICD-10-CM | POA: Diagnosis present

## 2017-03-13 DIAGNOSIS — A749 Chlamydial infection, unspecified: Secondary | ICD-10-CM

## 2017-03-13 DIAGNOSIS — O98812 Other maternal infectious and parasitic diseases complicating pregnancy, second trimester: Secondary | ICD-10-CM

## 2017-03-13 DIAGNOSIS — N898 Other specified noninflammatory disorders of vagina: Secondary | ICD-10-CM | POA: Insufficient documentation

## 2017-03-13 NOTE — Progress Notes (Addendum)
Patient ID: CORDELL COKE, female   DOB: August 05, 1995, 22 y.o.   MRN: [id number]   PRENATAL VISIT NOTE  Subjective:  Heather Durham is a 22 y.o. G1P0000 at [redacted]w[redacted]d being seen today for ongoing prenatal care.  She is currently monitored for the following issues for this low-risk pregnancy and has Supervision of other normal pregnancy, antepartum; Obesity in pregnancy, antepartum; Chlamydia infection affecting pregnancy in second trimester; and Vaginal discharge on their problem list.  Patient reports vaginal irritation. She states that she noticed some increased watery/white discharge last Thursday (6 days ago) and then noticed an odor on Sunday. She denies vaginal irritation, vaginal pain, dysuria, leaking of fluid. She states that the discharge just irritated her legs a little bit. She also feels like she is drawn to eat [place] soap, but so far has just been smelling it.  Contractions: Not present. Vag. Bleeding: None.  Movement: Present. Denies leaking of fluid.   The following portions of the patient's history were reviewed and updated as appropriate: allergies, current medications, past family history, past medical history, past social history, past surgical history and problem list. Problem list updated.  Objective:   Vitals:   03/13/17 1609  BP: 120/80  Pulse: (!) 106  Weight: 241 lb (109.3 kg)    Fetal Status: Fetal Heart Rate (bpm): 130 Fundal Height: 32 cm Movement: Present     General:  Alert, oriented and cooperative. Patient is in no acute distress.  Skin: Skin is warm and dry. No rash noted.   Cardiovascular: Normal heart rate noted  Respiratory: Normal respiratory effort, no problems with respiration noted  Abdomen: Soft, gravid, appropriate for gestational age.  Pain/Pressure: Absent     Pelvic: Cervical exam deferred        Extremities: Normal range of motion.  Edema: None  Mental Status:  Normal mood and affect. Normal behavior. Normal judgment and thought content.    Assessment and Plan:  Pregnancy: G1P0000 at [redacted]w[redacted]d  1. Supervision of other normal pregnancy, antepartum   -Baby RX Data reviewed. Patient had one elevated BP 116/90 on 2-26 but she had a normal reading 15 minutes later. Denies pre-e symptoms today.  - Cervicovaginal ancillary only  2. Vaginal discharge -blind wet prep done  3. Chlamydia infection affecting pregnancy in second trimester TOC was negative end of January.   4. Pica: reassured patient that this is a normal feeling, but important to resist urge to ingest soap. If she cannot control urges, she agrees to throw away any [place] soap in her house.   Preterm labor symptoms and general obstetric precautions including but not limited to vaginal bleeding, contractions, leaking of fluid and fetal movement were reviewed in detail with the patient. Please refer to After Visit Summary for other counseling recommendations.  Return in about 2 weeks (around 03/27/2017), or LROB.   Starr Lake, CNM

## 2017-03-13 NOTE — Progress Notes (Signed)
Elevated BRX BP but states her legs were crossed and BP was normal on retake.  She has vaginal discharge with odor and needs to be evaluated

## 2017-03-13 NOTE — Patient Instructions (Signed)

## 2017-03-18 LAB — CERVICOVAGINAL ANCILLARY ONLY
BACTERIAL VAGINITIS: POSITIVE — AB
Candida vaginitis: NEGATIVE
Chlamydia: NEGATIVE
NEISSERIA GONORRHEA: NEGATIVE
TRICH (WINDOWPATH): NEGATIVE

## 2017-03-19 ENCOUNTER — Other Ambulatory Visit: Payer: Self-pay | Admitting: Student

## 2017-03-19 MED ORDER — METRONIDAZOLE 500 MG PO TABS: 500.0000 mg | ORAL_TABLET | Freq: Two times a day (BID) | ORAL | 0 refills | Status: DC

## 2017-04-10 ENCOUNTER — Encounter: Payer: Self-pay | Admitting: Nurse Practitioner

## 2017-04-10 ENCOUNTER — Other Ambulatory Visit (HOSPITAL_COMMUNITY)
Admission: RE | Admit: 2017-04-10 | Discharge: 2017-04-10 | Disposition: A | Discharge status: Home / Self Care | Payer: Medicaid Other | Source: Ambulatory Visit | Attending: Nurse Practitioner | Admitting: Nurse Practitioner

## 2017-04-10 ENCOUNTER — Ambulatory Visit (INDEPENDENT_AMBULATORY_CARE_PROVIDER_SITE_OTHER): Payer: Medicaid Other | Admitting: Nurse Practitioner

## 2017-04-10 ENCOUNTER — Encounter: Payer: Self-pay | Admitting: Radiology

## 2017-04-10 VITALS — BP 122/82 | HR 108 | Wt 242.0 lb

## 2017-04-10 DIAGNOSIS — Z3480 Encounter for supervision of other normal pregnancy, unspecified trimester: Secondary | ICD-10-CM

## 2017-04-10 LAB — OB RESULTS CONSOLE GC/CHLAMYDIA: GC PROBE AMP, GENITAL: NEGATIVE

## 2017-04-10 LAB — OB RESULTS CONSOLE GBS: GBS: NEGATIVE

## 2017-04-10 NOTE — Patient Instructions (Addendum)
Braxton Hicks Contractions °Contractions of the uterus can occur throughout pregnancy, but they are not always a sign that you are in labor. You may have practice contractions called Braxton Hicks contractions. These false labor contractions are sometimes confused with true labor. °What are Braxton Hicks contractions? °Braxton Hicks contractions are tightening movements that occur in the muscles of the uterus before labor. Unlike true labor contractions, these contractions do not result in opening (dilation) and thinning of the cervix. Toward the end of pregnancy (32-34 weeks), Braxton Hicks contractions can happen more often and may become stronger. These contractions are sometimes difficult to tell apart from true labor because they can be very uncomfortable. You should not feel embarrassed if you go to the hospital with false labor. °Sometimes, the only way to tell if you are in true labor is for your health care provider to look for changes in the cervix. The health care provider will do a physical exam and may monitor your contractions. If you are not in true labor, the exam should show that your cervix is not dilating and your water has not broken. °If there are other health problems associated with your pregnancy, it is completely safe for you to be sent home with false labor. You may continue to have Braxton Hicks contractions until you go into true labor. °How to tell the difference between true labor and false labor °True labor °· Contractions last 30-70 seconds. °· Contractions become very regular. °· Discomfort is usually felt in the top of the uterus, and it spreads to the lower abdomen and low back. °· Contractions do not go away with walking. °· Contractions usually become more intense and increase in frequency. °· The cervix dilates and gets thinner. °False labor °· Contractions are usually shorter and not as strong as true labor contractions. °· Contractions are usually irregular. °· Contractions  are often felt in the front of the lower abdomen and in the groin. °· Contractions may go away when you walk around or change positions while lying down. °· Contractions get weaker and are shorter-lasting as time goes on. °· The cervix usually does not dilate or become thin. °Follow these instructions at home: °· Take over-the-counter and prescription medicines only as told by your health care provider. °· Keep up with your usual exercises and follow other instructions from your health care provider. °· Eat and drink lightly if you think you are going into labor. °· If Braxton Hicks contractions are making you uncomfortable: °? Change your position from lying down or resting to walking, or change from walking to resting. °? Sit and rest in a tub of warm water. °? Drink enough fluid to keep your urine pale yellow. Dehydration may cause these contractions. °? Do slow and deep breathing several times an hour. °· Keep all follow-up prenatal visits as told by your health care provider. This is important. °Contact a health care provider if: °· You have a fever. °· You have continuous pain in your abdomen. °Get help right away if: °· Your contractions become stronger, more regular, and closer together. °· You have fluid leaking or gushing from your vagina. °· You pass blood-tinged mucus (bloody show). °· You have bleeding from your vagina. °· You have low back pain that you never had before. °· You feel your baby’s head pushing down and causing pelvic pressure. °· Your baby is not moving inside you as much as it used to. °Summary °· Contractions that occur before labor are called Braxton   Hicks contractions, false labor, or practice contractions. °· Braxton Hicks contractions are usually shorter, weaker, farther apart, and less regular than true labor contractions. True labor contractions usually become progressively stronger and regular and they become more frequent. °· Manage discomfort from Braxton Hicks contractions by  changing position, resting in a warm bath, drinking plenty of water, or practicing deep breathing. °This information is not intended to replace advice given to you by your health care provider. Make sure you discuss any questions you have with your health care provider. °Document Released: 05/17/2016 Document Revised: 05/17/2016 Document Reviewed: 05/17/2016 °Elsevier Interactive Patient Education © 2018 [employer] ° °

## 2017-04-10 NOTE — Progress Notes (Signed)
    Subjective:  Heather Durham is a 22 y.o. G1P0000 at [redacted]w[redacted]d being seen today for ongoing prenatal care.  She is currently monitored for the following issues for this low-risk pregnancy and has Supervision of other normal pregnancy, antepartum; Obesity in pregnancy, antepartum; Chlamydia infection affecting pregnancy in second trimester; Vaginal discharge; and Pica in adults on their problem list.  Patient reports no complaints and loss of mucus plug on Monday.  Contractions: Irregular. Vag. Bleeding: None.  Movement: Present. Denies leaking of fluid.   The following portions of the patient's history were reviewed and updated as appropriate: allergies, current medications, past family history, past medical history, past social history, past surgical history and problem list. Problem list updated.  Objective:   Vitals:   04/10/17 1553  BP: 122/82  Pulse: (!) 108  Weight: 242 lb (109.8 kg)    Fetal Status: Fetal Heart Rate (bpm): 130 Fundal Height: 38 cm Movement: Present     General:  Alert, oriented and cooperative. Patient is in no acute distress.  Skin: Skin is warm and dry. No rash noted.   Cardiovascular: Normal heart rate noted  Respiratory: Normal respiratory effort, no problems with respiration noted  Abdomen: Soft, gravid, appropriate for gestational age. Pain/Pressure: Present     Pelvic:  Cervical exam performed Dilation: Closed Effacement (%): Thick Station: -3  Extremities: Normal range of motion.  Edema: Mild pitting, slight indentation  Mental Status: Normal mood and affect. Normal behavior. Normal judgment and thought content.   Urinalysis:      Assessment and Plan:  Pregnancy: G1P0000 at [redacted]w[redacted]d  1. Supervision of other normal pregnancy, antepartum  - Strep Gp B NAA - GC/Chlamydia probe amp ([place])not at [facility]  Preterm labor symptoms and general obstetric precautions including but not limited to vaginal bleeding, contractions, leaking of fluid and fetal  movement were reviewed in detail with the patient. Please refer to After Visit Summary for other counseling recommendations.  Return in about 1 week (around 04/17/2017).  Reviewed signs and symptoms of increasing BP Advised baby should move well every day.  Earlie Server, RN, MSN, NP-BC Nurse Practitioner, [facility] for [employer], [facility] 04/10/2017 4:55 PM

## 2017-04-12 LAB — GC/CHLAMYDIA PROBE AMP (~~LOC~~) NOT AT ARMC
CHLAMYDIA, DNA PROBE: NEGATIVE
NEISSERIA GONORRHEA: NEGATIVE

## 2017-04-12 LAB — STREP GP B NAA: STREP GROUP B AG: NEGATIVE

## 2017-04-15 ENCOUNTER — Encounter: Payer: Self-pay | Admitting: *Deleted

## 2017-04-24 ENCOUNTER — Ambulatory Visit (INDEPENDENT_AMBULATORY_CARE_PROVIDER_SITE_OTHER): Payer: Medicaid Other | Admitting: Nurse Practitioner

## 2017-04-24 ENCOUNTER — Encounter: Payer: Self-pay | Admitting: Radiology

## 2017-04-24 DIAGNOSIS — Z3480 Encounter for supervision of other normal pregnancy, unspecified trimester: Secondary | ICD-10-CM

## 2017-04-24 NOTE — Progress Notes (Signed)
    Subjective:  Heather Durham is a 22 y.o. G1P0000 at [redacted]w[redacted]d being seen today for ongoing prenatal care.  She is currently monitored for the following issues for this low-risk pregnancy and has Supervision of other normal pregnancy, antepartum; Obesity in pregnancy, antepartum; Chlamydia infection affecting pregnancy in second trimester; Vaginal discharge; and Pica in adults on their problem list.  Patient reports minimal contractions, no problems.  Contractions: Irregular. Vag. Bleeding: None.  Movement: Present. Denies leaking of fluid.   The following portions of the patient's history were reviewed and updated as appropriate: allergies, current medications, past family history, past medical history, past social history, past surgical history and problem list. Problem list updated.  Objective:   Vitals:   04/24/17 1604  BP: 118/78  Pulse: (!) 110  Weight: 247 lb 9.6 oz (112.3 kg)    Fetal Status: Fetal Heart Rate (bpm): 130 Fundal Height: 40 cm Movement: Present     General:  Alert, oriented and cooperative. Patient is in no acute distress.  Skin: Skin is warm and dry. No rash noted.   Cardiovascular: Normal heart rate noted  Respiratory: Normal respiratory effort, no problems with respiration noted  Abdomen: Soft, gravid, appropriate for gestational age. Pain/Pressure: Present     Pelvic:  Cervical exam performed Dilation: Fingertip Effacement (%): 20 Station: -3  Extremities: Normal range of motion.  Edema: Mild pitting, slight indentation  Mental Status: Normal mood and affect. Normal behavior. Normal judgment and thought content.   Urinalysis:    NA  Assessment and Plan:  Pregnancy: G1P0000 at [redacted]w[redacted]d  1. Supervision of other normal pregnancy, antepartum Doing well, reviewed signs of labor and how to time contractionss  Term labor symptoms and general obstetric precautions including but not limited to vaginal bleeding, contractions, leaking of fluid and fetal movement were  reviewed in detail with the patient. Please refer to After Visit Summary for other counseling recommendations.  Return in about 1 week (around 05/01/2017).  Earlie Server, RN, MSN, NP-BC Nurse Practitioner, [facility] for [employer], [facility] 04/24/2017 4:23 PM

## 2017-04-24 NOTE — Progress Notes (Signed)
Pt desires a cervix check

## 2017-05-01 ENCOUNTER — Ambulatory Visit (INDEPENDENT_AMBULATORY_CARE_PROVIDER_SITE_OTHER): Payer: Medicaid Other | Admitting: Family Medicine

## 2017-05-01 ENCOUNTER — Encounter: Payer: Self-pay | Admitting: *Deleted

## 2017-05-01 ENCOUNTER — Encounter: Payer: Self-pay | Admitting: Family Medicine

## 2017-05-01 ENCOUNTER — Encounter: Payer: Self-pay | Admitting: Radiology

## 2017-05-01 VITALS — BP 114/75 | HR 100 | Wt 247.0 lb

## 2017-05-01 DIAGNOSIS — O99210 Obesity complicating pregnancy, unspecified trimester: Secondary | ICD-10-CM

## 2017-05-01 DIAGNOSIS — O98812 Other maternal infectious and parasitic diseases complicating pregnancy, second trimester: Secondary | ICD-10-CM

## 2017-05-01 DIAGNOSIS — A749 Chlamydial infection, unspecified: Secondary | ICD-10-CM

## 2017-05-01 DIAGNOSIS — Z3480 Encounter for supervision of other normal pregnancy, unspecified trimester: Secondary | ICD-10-CM

## 2017-05-01 NOTE — Progress Notes (Signed)
   PRENATAL VISIT NOTE  Subjective:  Heather Durham is a 22 y.o. G1P0000 at [redacted]w[redacted]d being seen today for ongoing prenatal care.  She is currently monitored for the following issues for this low-risk pregnancy and has Supervision of other normal pregnancy, antepartum; Obesity in pregnancy, antepartum; Chlamydia infection affecting pregnancy in second trimester; Vaginal discharge; and Pica in adults on their problem list.  Patient reports no complaints.  Contractions: Irregular. Vag. Bleeding: None.  Movement: Present. Denies leaking of fluid.   The following portions of the patient's history were reviewed and updated as appropriate: allergies, current medications, past family history, past medical history, past social history, past surgical history and problem list. Problem list updated.  Objective:   Vitals:   05/01/17 1621  BP: 114/75  Pulse: 100  Weight: 247 lb (112 kg)    Fetal Status: Fetal Heart Rate (bpm): 135 Fundal Height: 40 cm Movement: Present     General:  Alert, oriented and cooperative. Patient is in no acute distress.  Skin: Skin is warm and dry. No rash noted.   Cardiovascular: Normal heart rate noted  Respiratory: Normal respiratory effort, no problems with respiration noted  Abdomen: Soft, gravid, appropriate for gestational age.  Pain/Pressure: Absent     Pelvic: Cervical exam deferred        Extremities: Normal range of motion.  Edema: Trace  Mental Status: Normal mood and affect. Normal behavior. Normal judgment and thought content.   Assessment and Plan:  Pregnancy: G1P0000 at [redacted]w[redacted]d  1. Supervision of other normal pregnancy, antepartum UTD, IOL frm filled out Reviewed possible sweeping at next appt if favorable for this  2. Obesity in pregnancy, antepartum TWG= 27 lb (12.2 kg) which is above goal.   3. Chlamydia infection affecting pregnancy in second trimester TOC neg and neg at 36 weeks  Term labor symptoms and general obstetric precautions including  but not limited to vaginal bleeding, contractions, leaking of fluid and fetal movement were reviewed in detail with the patient. Please refer to After Visit Summary for other counseling recommendations.  Return in about 1 week (around 05/08/2017) for Routine prenatal care.  Future Appointments  Date Time Provider [place]  05/07/2017  4:15 PM Aletha Halim, MD [facility]  05/14/2017  7:30 AM [facility] None    Caren Macadam, MD

## 2017-05-02 ENCOUNTER — Telehealth (HOSPITAL_COMMUNITY): Payer: Self-pay | Admitting: *Deleted

## 2017-05-02 NOTE — Telephone Encounter (Signed)
Preadmission screen  

## 2017-05-07 ENCOUNTER — Ambulatory Visit (INDEPENDENT_AMBULATORY_CARE_PROVIDER_SITE_OTHER): Payer: Medicaid Other | Admitting: Obstetrics and Gynecology

## 2017-05-07 ENCOUNTER — Encounter: Payer: Self-pay | Admitting: Obstetrics and Gynecology

## 2017-05-07 ENCOUNTER — Inpatient Hospital Stay (HOSPITAL_COMMUNITY): Admission: AD | Admit: 2017-05-07 | Payer: Medicaid Other | Source: Ambulatory Visit | Admitting: Family Medicine

## 2017-05-07 VITALS — BP 126/74 | HR 105 | Wt 244.0 lb

## 2017-05-07 DIAGNOSIS — O480 Post-term pregnancy: Secondary | ICD-10-CM

## 2017-05-07 DIAGNOSIS — Z3403 Encounter for supervision of normal first pregnancy, third trimester: Secondary | ICD-10-CM

## 2017-05-07 DIAGNOSIS — Z3480 Encounter for supervision of other normal pregnancy, unspecified trimester: Secondary | ICD-10-CM

## 2017-05-07 NOTE — Progress Notes (Signed)
Prenatal Visit Note Date: 05/07/2017 Clinic: [facility]-  Subjective:  Heather Durham is a 22 y.o. G1P0000 at [redacted]w[redacted]d being seen today for ongoing prenatal care.  She is currently monitored for the following issues for this low-risk pregnancy and has Supervision of other normal pregnancy, antepartum; Obesity in pregnancy, antepartum; Chlamydia infection affecting pregnancy in second trimester; Vaginal discharge; and Pica in adults on their problem list.  Patient reports no complaints.   Contractions: Irregular. Vag. Bleeding: None.  Movement: Present. Denies leaking of fluid.   The following portions of the patient's history were reviewed and updated as appropriate: allergies, current medications, past family history, past medical history, past social history, past surgical history and problem list. Problem list updated.  Objective:   Vitals:   05/07/17 1627  BP: 126/74  Pulse: (!) 105  Weight: 244 lb (110.7 kg)    Fetal Status: Fetal Heart Rate (bpm): NST   Movement: Present  Presentation: Vertex  General:  Alert, oriented and cooperative. Patient is in no acute distress.  Skin: Skin is warm and dry. No rash noted.   Cardiovascular: Normal heart rate noted  Respiratory: Normal respiratory effort, no problems with respiration noted  Abdomen: Soft, gravid, appropriate for gestational age. Pain/Pressure: Present     Pelvic:  Cervical exam deferred        Extremities: Normal range of motion.  Edema: Trace  Mental Status: Normal mood and affect. Normal behavior. Normal judgment and thought content.   Urinalysis:      Assessment and Plan:  Pregnancy: G1P0000 at [redacted]w[redacted]d  1. Supervision of other normal pregnancy, antepartum Routine care. RNST, normal AFI, cephalic. Already sent up for post dates IOL - Fetal nonstress test - [place] bedside; Future   Term labor symptoms and general obstetric precautions including but not limited to vaginal bleeding, contractions,  leaking of fluid and fetal movement were reviewed in detail with the patient. Please refer to After Visit Summary for other counseling recommendations.  Return in about 5 weeks (around 06/11/2017) for postpartum visit.   Aletha Halim, MD

## 2017-05-14 ENCOUNTER — Inpatient Hospital Stay (HOSPITAL_COMMUNITY)
Admission: RE | Admit: 2017-05-14 | Discharge: 2017-05-17 | DRG: 807 | Disposition: A | Discharge status: Home / Self Care | Payer: Medicaid Other | Source: Ambulatory Visit | Attending: Obstetrics & Gynecology | Admitting: Obstetrics & Gynecology

## 2017-05-14 ENCOUNTER — Encounter (HOSPITAL_COMMUNITY): Payer: Self-pay

## 2017-05-14 DIAGNOSIS — E669 Obesity, unspecified: Secondary | ICD-10-CM | POA: Diagnosis present

## 2017-05-14 DIAGNOSIS — O480 Post-term pregnancy: Principal | ICD-10-CM | POA: Diagnosis present

## 2017-05-14 DIAGNOSIS — Z3A41 41 weeks gestation of pregnancy: Secondary | ICD-10-CM | POA: Diagnosis not present

## 2017-05-14 DIAGNOSIS — O99214 Obesity complicating childbirth: Secondary | ICD-10-CM | POA: Diagnosis present

## 2017-05-14 DIAGNOSIS — Z3480 Encounter for supervision of other normal pregnancy, unspecified trimester: Secondary | ICD-10-CM

## 2017-05-14 LAB — TYPE AND SCREEN
ABO/RH(D): O POS
Antibody Screen: NEGATIVE

## 2017-05-14 LAB — CBC
HCT: 36 % (ref 36.0–46.0)
HEMOGLOBIN: 12.4 g/dL (ref 12.0–15.0)
MCH: 30.5 pg (ref 26.0–34.0)
MCHC: 34.4 g/dL (ref 30.0–36.0)
MCV: 88.7 fL (ref 78.0–100.0)
PLATELETS: 230 10*3/uL (ref 150–400)
RBC: 4.06 MIL/uL (ref 3.87–5.11)
RDW: 13.6 % (ref 11.5–15.5)
WBC: 13 10*3/uL — AB (ref 4.0–10.5)

## 2017-05-14 LAB — RPR: RPR: NONREACTIVE

## 2017-05-14 MED ORDER — FENTANYL 2.5 MCG/ML BUPIVACAINE 1/10 % EPIDURAL INFUSION (WH - ANES)
14.0000 mL/h | INTRAMUSCULAR | Status: DC | PRN
  Administered 2017-05-15: 14 mL/h via EPIDURAL
  Filled 2017-05-14: qty 100

## 2017-05-14 MED ORDER — MISOPROSTOL 25 MCG QUARTER TABLET
25.0000 ug | ORAL_TABLET | ORAL | Status: DC | PRN
  Administered 2017-05-14: 25 ug via VAGINAL
  Filled 2017-05-14: qty 1

## 2017-05-14 MED ORDER — OXYTOCIN 40 UNITS IN LACTATED RINGERS INFUSION - SIMPLE MED
1.0000 m[IU]/min | INTRAVENOUS | Status: DC
  Administered 2017-05-14: 2 m[IU]/min via INTRAVENOUS

## 2017-05-14 MED ORDER — TERBUTALINE SULFATE 1 MG/ML IJ SOLN: 0.2500 mg | Freq: Once | INTRAMUSCULAR | Status: DC | PRN

## 2017-05-14 MED ORDER — OXYTOCIN BOLUS FROM INFUSION
500.0000 mL | Freq: Once | INTRAVENOUS | Status: AC
  Administered 2017-05-15: 500 mL via INTRAVENOUS

## 2017-05-14 MED ORDER — EPHEDRINE 5 MG/ML INJ: 10.0000 mg | INTRAVENOUS | Status: DC | PRN

## 2017-05-14 MED ORDER — HYDROXYZINE HCL 50 MG PO TABS: 50.0000 mg | ORAL_TABLET | Freq: Four times a day (QID) | ORAL | Status: DC | PRN

## 2017-05-14 MED ORDER — FLEET ENEMA 7-19 GM/118ML RE ENEM: 1.0000 | ENEMA | RECTAL | Status: DC | PRN

## 2017-05-14 MED ORDER — LIDOCAINE HCL (PF) 1 % IJ SOLN
30.0000 mL | INTRAMUSCULAR | Status: DC | PRN
  Filled 2017-05-14: qty 30

## 2017-05-14 MED ORDER — LACTATED RINGERS IV SOLN
INTRAVENOUS | Status: DC
  Administered 2017-05-14 – 2017-05-15 (×3): via INTRAVENOUS

## 2017-05-14 MED ORDER — ACETAMINOPHEN 325 MG PO TABS: 650.0000 mg | ORAL_TABLET | ORAL | Status: DC | PRN

## 2017-05-14 MED ORDER — PHENYLEPHRINE 40 MCG/ML (10ML) SYRINGE FOR IV PUSH (FOR BLOOD PRESSURE SUPPORT)
80.0000 ug | PREFILLED_SYRINGE | INTRAVENOUS | Status: DC | PRN
  Filled 2017-05-14: qty 10

## 2017-05-14 MED ORDER — LACTATED RINGERS IV SOLN
500.0000 mL | INTRAVENOUS | Status: DC | PRN
  Administered 2017-05-15: 500 mL via INTRAVENOUS

## 2017-05-14 MED ORDER — OXYCODONE-ACETAMINOPHEN 5-325 MG PO TABS
2.0000 | ORAL_TABLET | ORAL | Status: DC | PRN
Start: 2017-05-14 — End: 2017-05-15

## 2017-05-14 MED ORDER — ONDANSETRON HCL 4 MG/2ML IJ SOLN
4.0000 mg | Freq: Four times a day (QID) | INTRAMUSCULAR | Status: DC | PRN
  Administered 2017-05-15: 4 mg via INTRAVENOUS
  Filled 2017-05-14: qty 2

## 2017-05-14 MED ORDER — LACTATED RINGERS IV SOLN
500.0000 mL | Freq: Once | INTRAVENOUS | Status: AC
  Administered 2017-05-15: 500 mL via INTRAVENOUS

## 2017-05-14 MED ORDER — SOD CITRATE-CITRIC ACID 500-334 MG/5ML PO SOLN: 30.0000 mL | ORAL | Status: DC | PRN

## 2017-05-14 MED ORDER — DIPHENHYDRAMINE HCL 50 MG/ML IJ SOLN: 12.5000 mg | INTRAMUSCULAR | Status: DC | PRN

## 2017-05-14 MED ORDER — FENTANYL CITRATE (PF) 100 MCG/2ML IJ SOLN
50.0000 ug | INTRAMUSCULAR | Status: DC | PRN
  Administered 2017-05-14 – 2017-05-15 (×3): 100 ug via INTRAVENOUS
  Filled 2017-05-14 (×3): qty 2

## 2017-05-14 MED ORDER — PHENYLEPHRINE 40 MCG/ML (10ML) SYRINGE FOR IV PUSH (FOR BLOOD PRESSURE SUPPORT)
80.0000 ug | PREFILLED_SYRINGE | INTRAVENOUS | Status: DC | PRN
  Administered 2017-05-15: 80 ug via INTRAVENOUS
  Filled 2017-05-14: qty 10

## 2017-05-14 MED ORDER — OXYCODONE-ACETAMINOPHEN 5-325 MG PO TABS: 1.0000 | ORAL_TABLET | ORAL | Status: DC | PRN

## 2017-05-14 MED ORDER — OXYTOCIN 40 UNITS IN LACTATED RINGERS INFUSION - SIMPLE MED
2.5000 [IU]/h | INTRAVENOUS | Status: DC
  Filled 2017-05-14: qty 1000

## 2017-05-14 MED ORDER — ZOLPIDEM TARTRATE 5 MG PO TABS: 5.0000 mg | ORAL_TABLET | Freq: Every evening | ORAL | Status: DC | PRN

## 2017-05-14 NOTE — Progress Notes (Signed)
Subjective: Doing well. Tolerating contraction pain well. Does not wish for epidural at this time.   Objective: BP 127/79   Pulse (!) 119   Temp 98.7 F (37.1 C) (Oral)   Resp 18   Ht 5\' 8"  (1.727 m)   Wt 112.5 kg (248 lb)   LMP 07/26/2016   BMI 37.71 kg/m  No intake/output data recorded. No intake/output data recorded.  FHT:  FHR: 130 bpm, variability: moderate,  accelerations:  Abscent,  decelerations:  Absent. Interpretation limited by poor strip quality. UC:   irregular, every 3-4 minutes SVE:   Dilation: 4 Effacement (%): Thick Station: -3 Exam by:: DR.Campbell  Labs: Lab Results  Component Value Date   WBC 13.0 (H) 05/14/2017   HGB 12.4 05/14/2017   HCT 36.0 05/14/2017   MCV 88.7 05/14/2017   PLT 230 05/14/2017    Assessment / Plan: Induction of labor due to postterm. S/P FB, progressing on pitocin.   Labor: Progressing on Pitocin, will continue to increase then AROM. S/p cytotec and FB Preeclampsia:  N/A Fetal Wellbeing:  Category I Pain Control:  IV pain meds I/D:  n/a Anticipated MOD:  NSVD  Heather Durham 05/14/2017, 8:36 PM

## 2017-05-14 NOTE — H&P (Addendum)
LABOR AND DELIVERY ADMISSION HISTORY AND PHYSICAL NOTE  Heather Durham is a 22 y.o. female G1P0000 with IUP at [redacted]w[redacted]d by first trimester ultrasound presenting for IOL secondary to post dates.   She reports positive fetal movement. She denies leakage of fluid or vaginal bleeding.  Prenatal History/Complications: Chlamydial infection during second trimester. + on 09/06/16, treated, remained + on 11//8/18. Treated second time and finally negative on 04/10/17 testing.  Pica- desire to eat ivory soap. Refrained during pregnancy  [facility]NC: [facility]  Sonogram:  Last completed 01/09/17 Breech presentation Anterior placenta above cervical os AFI 5.2; subjectively within normal limits EFW 629 g (51%)  Past Medical History: Past Medical History:  Diagnosis Date  . Chlamydia   . Trichomonas infection   . UTI (urinary tract infection)     Past Surgical History: Past Surgical History:  Procedure Laterality Date  . FOOT SURGERY Right     Obstetrical History: OB History    Gravida  1   Para  0   Term  0   Preterm  0   AB  0   Living  0     SAB  0   TAB  0   Ectopic  0   Multiple  0   Live Births  0           Social History: Social History   Socioeconomic History  . Marital status: Single    Spouse name: Not on file  . Number of children: Not on file  . Years of education: Not on file  . Highest education level: Not on file  Occupational History  . Not on file  Social Needs  . Financial resource strain: Not on file  . Food insecurity:    Worry: Not on file    Inability: Not on file  . Transportation needs:    Medical: Not on file    Non-medical: Not on file  Tobacco Use  . Smoking status: Never Smoker  . Smokeless tobacco: Never Used  Substance and Sexual Activity  . Alcohol use: No  . Drug use: No  . Sexual activity: Yes    Birth control/protection: None  Lifestyle  . Physical activity:    Days per week: Not on file    Minutes per session: Not on  file  . Stress: Not on file  Relationships  . Social connections:    Talks on phone: Not on file    Gets together: Not on file    Attends religious service: Not on file    Active member of club or organization: Not on file    Attends meetings of clubs or organizations: Not on file    Relationship status: Not on file  Other Topics Concern  . Not on file  Social History Narrative  . Not on file    Family History: No family history on file.  Allergies: No Known Allergies  Medications Prior to Admission  Medication Sig Dispense Refill Last Dose  . Prenatal Vit-Fe Fumarate-FA (PRENATAL VITAMIN) 27-0.8 MG TABS Take 1 tablet by mouth daily. 30 tablet 1 05/13/2017 at Unknown time     Review of Systems   All systems reviewed and negative except as stated in HPI  Blood pressure 125/84, pulse (!) 115, temperature 98.9 F (37.2 C), temperature source Oral, resp. rate 16, height 5\' 8"  (1.727 m), weight 112.5 kg (248 lb), last menstrual period 07/26/2016. General appearance: alert, cooperative and no distress Lungs: clear to auscultation bilaterally Heart: regular rate  and rhythm Abdomen: soft, non-tender; bowel sounds normal Extremities: No calf swelling or tenderness Presentation: cephalic Fetal monitoring: baseline 145, +accels, -decels, mild to moderate variability Uterine activity: irregular q5-7 minutes Dilation: Closed Effacement (%): Thick Station: -3 Exam by:: lee   Prenatal labs: ABO, Rh: O/Positive/-- (09/25 1420) Antibody: Negative (09/25 1420) Rubella: 5.74 (09/25 1420) RPR: Non Reactive (01/25 0822)  HBsAg: Negative (09/25 1420)  HIV: Non Reactive (01/25 4818)  GBS: Negative (03/27 1550)  1 hr Glucola: normal Genetic screening:  normal Anatomy US: normal; girl  Prenatal Transfer Tool  Maternal Diabetes: No Genetic Screening: Normal Maternal Ultrasounds/Referrals: Normal Fetal Ultrasounds or other Referrals:  None Maternal Substance Abuse:   No Significant Maternal Medications:  None Significant Maternal Lab Results: Lab values include: Group B Strep negative  Results for orders placed or performed during the hospital encounter of 05/14/17 (from the past 24 hour(s))  CBC   Collection Time: 05/14/17  8:02 AM  Result Value Ref Range   WBC 13.0 (H) 4.0 - 10.5 K/uL   RBC 4.06 3.87 - 5.11 MIL/uL   Hemoglobin 12.4 12.0 - 15.0 g/dL   HCT 36.0 36.0 - 46.0 %   MCV 88.7 78.0 - 100.0 fL   MCH 30.5 26.0 - 34.0 pg   MCHC 34.4 30.0 - 36.0 g/dL   RDW 13.6 11.5 - 15.5 %   Platelets 230 150 - 400 K/uL    Patient Active Problem List   Diagnosis Date Noted  . Post-dates pregnancy 05/14/2017  . Vaginal discharge 03/13/2017  . Pica in adults 03/13/2017  . Chlamydia infection affecting pregnancy in second trimester 12/14/2016  . Supervision of other normal pregnancy, antepartum 10/09/2016  . Obesity in pregnancy, antepartum 10/09/2016    Assessment: Heather Durham is a 22 y.o. G1P0000 at [redacted]w[redacted]d here for IOL secondary to post-dates. EDD 05/07/17.   #Labor: Induction of labor for post dates. Bishop score of 0. Cervical ripening with cytotec. Will consider attempting foley bulb placement later if dilation occurs.  #Pain: Desires no epidural at this time #FWB: Cat I; reassuring. Will monitor variability.  #ID:  GBS negative; no abx indicated #MOF: Breast #MOC: OCPs #Circ:  N/a; girl  Loann Quill, MD PGY-3 05/14/2017, 8:58 AM   Midwife attestation: I have seen and examined this patient; I agree with above documentation in the resident's note.   Heather Durham is a 22 y.o. G1P0000 here for IOL for postdates.  PE: BP 128/82 (BP Location: Left Arm)   Pulse 99   Temp 98.7 F (37.1 C) (Oral)   Resp 17   Ht 5\' 8"  (1.727 m)   Wt 248 lb (112.5 kg)   LMP 07/26/2016   BMI 37.71 kg/m  Gen: calm comfortable, NAD Resp: normal effort, no distress Abd: gravid  ROS, labs, PMH reviewed  Plan: Admit to LD Labor: Cytotec Q  4 h FWB: Category I ID: GBS negative  Fatima Blank, CNM  05/14/2017, 2:55 PM

## 2017-05-14 NOTE — Progress Notes (Signed)
Heather Durham is a 22 y.o. G1P0000 at [redacted]w[redacted]d by ultrasound admitted for induction of labor due to Post dates. Due date 05/07/17.  Subjective: Patient starting to have more discomfort and more regular contractions. Foley bulb is out.   Objective: BP 128/82 (BP Location: Left Arm)   Pulse 99   Temp 98.7 F (37.1 C) (Oral)   Resp 17   Ht 5\' 8"  (1.727 m)   Wt 112.5 kg (248 lb)   LMP 07/26/2016   BMI 37.71 kg/m  No intake/output data recorded. No intake/output data recorded.  FHT:  FHR: 135 bpm, variability: moderate,  accelerations:  Present,  decelerations:  Present occasional variable UC:   regular, every 2-3 minutes SVE:   Dilation: 1 Effacement (%): Thick Station: -3 Exam by:: leftwhich kirby  Labs: Lab Results  Component Value Date   WBC 13.0 (H) 05/14/2017   HGB 12.4 05/14/2017   HCT 36.0 05/14/2017   MCV 88.7 05/14/2017   PLT 230 05/14/2017    Assessment / Plan: Induction of labor due to post dates. Patient now s/p cytotec x 1 with some decreased variability and a poor contraction pattern with occasional late decelerations.   Labor: Foley bulb removed, fell out of cervix. Will start on pitocin and titrate up as tolerated Preeclampsia:  no signs or symptoms of toxicity Fetal Wellbeing:  Category I Pain Control:  Labor support without medications I/D:  n/a; GBS negative Anticipated MOD:  NSVD  Crissie Sickles 05/14/2017, 6:09 PM

## 2017-05-14 NOTE — Progress Notes (Signed)
Sion YAJAIRA DOFFING is a 22 y.o. G1P0000 at [redacted]w[redacted]d by ultrasound admitted for induction of labor due to Post dates. Due date 05/07/17.  Subjective: Patient with mild discomfort related to contractions from cytotec. Foley bulb placed without complications.   Objective: BP 124/72   Pulse 92   Temp 98.9 F (37.2 C) (Oral)   Resp 16   Ht 5\' 8"  (1.727 m)   Wt 112.5 kg (248 lb)   LMP 07/26/2016   BMI 37.71 kg/m  No intake/output data recorded. No intake/output data recorded.  FHT:  FHR: 125 bpm, variability: moderate,  accelerations:  Present,  decelerations:  Present occasional late decelerations UC:   irregular, every 3-7 minutes SVE:   Dilation: 1 Effacement (%): Thick Station: -3 Exam by:: leftwhich kirby  Labs: Lab Results  Component Value Date   WBC 13.0 (H) 05/14/2017   HGB 12.4 05/14/2017   HCT 36.0 05/14/2017   MCV 88.7 05/14/2017   PLT 230 05/14/2017    Assessment / Plan: Induction of labor due to post dates. Patient received cytotec x 1 with some decreased variabiility, a poor contraction pattern, and occasional late decelerations.   Labor: Patient continues to have a low bishop score. Given the changes in fetal heart tracing noted with cytotec, will defer further cytotec placement. Foley bulb placed without complications.  Preeclampsia:  no signs or symptoms of toxicity Fetal Wellbeing:  Category II Pain Control:  Labor support without medications I/D:  n/a; GBS negative Anticipated MOD:  NSVD  Crissie Sickles 05/14/2017, 1:23 PM

## 2017-05-14 NOTE — Anesthesia Pain Management Evaluation Note (Signed)
  CRNA Pain Management Visit Note  Patient: Heather Durham, 22 y.o., female  "Hello I am a member of the anesthesia team at [facility]. We have an anesthesia team available at all times to provide care throughout the hospital, including epidural management and anesthesia for C-section. I don't know your plan for the delivery whether it a natural birth, water birth, IV sedation, nitrous supplementation, doula or epidural, but we want to meet your pain goals."   1.Was your pain managed to your expectations on prior hospitalizations?   No prior hospitalizations  2.What is your expectation for pain management during this hospitalization?     IV pain meds  3.How can we help you reach that goal? IV pain medicine as desired  Record the patient's initial score and the patient's pain goal.   Pain: 0  Pain Goal: 7 [facility] wants you to be able to say your pain was always managed very well.  Dwane Andres 05/14/2017

## 2017-05-14 NOTE — Progress Notes (Signed)
Subjective: in a lot of pain earlier. Had received dose of IV pain meds prior to my entering room and was sleeping as I entered.   Objective: BP 109/72   Pulse 98   Temp 98.3 F (36.8 C) (Oral)   Resp 18   Ht 5\' 8"  (1.727 m)   Wt 112.5 kg (248 lb)   LMP 07/26/2016   BMI 37.71 kg/m  No intake/output data recorded. No intake/output data recorded.  FHT:  FHR: 120 bpm, variability: minimal ,  accelerations:  Abscent,  decelerations:  Absent UC:   irregular, every 4-5 minutes SVE:   Dilation: 4 Effacement (%): Thick Station: -3 Exam by:: Dr. Gerarda Fraction  Labs: Lab Results  Component Value Date   WBC 13.0 (H) 05/14/2017   HGB 12.4 05/14/2017   HCT 36.0 05/14/2017   MCV 88.7 05/14/2017   PLT 230 05/14/2017    Assessment / Plan: Induction of labor due to postterm. S/P FB, no progress since last check  Labor: Progressing on Pitocin, will continue to increase then AROM. S/p cytotec and FB Preeclampsia:  N/A Fetal Wellbeing:  Category I Pain Control:  IV pain meds I/D:  n/a Anticipated MOD:  NSVD  Heather Durham 05/14/2017, 11:56 PM

## 2017-05-15 ENCOUNTER — Encounter (HOSPITAL_COMMUNITY): Payer: Self-pay

## 2017-05-15 ENCOUNTER — Inpatient Hospital Stay (HOSPITAL_COMMUNITY): Payer: Medicaid Other | Admitting: Anesthesiology

## 2017-05-15 ENCOUNTER — Other Ambulatory Visit: Payer: Self-pay

## 2017-05-15 DIAGNOSIS — O480 Post-term pregnancy: Secondary | ICD-10-CM

## 2017-05-15 DIAGNOSIS — Z3A41 41 weeks gestation of pregnancy: Secondary | ICD-10-CM

## 2017-05-15 MED ORDER — DIBUCAINE 1 % RE OINT: 1.0000 "application " | TOPICAL_OINTMENT | RECTAL | Status: DC | PRN

## 2017-05-15 MED ORDER — IBUPROFEN 600 MG PO TABS
600.0000 mg | ORAL_TABLET | Freq: Four times a day (QID) | ORAL | Status: DC
  Administered 2017-05-15 – 2017-05-17 (×7): 600 mg via ORAL
  Filled 2017-05-15 (×7): qty 1

## 2017-05-15 MED ORDER — SIMETHICONE 80 MG PO CHEW: 80.0000 mg | CHEWABLE_TABLET | ORAL | Status: DC | PRN

## 2017-05-15 MED ORDER — LIDOCAINE HCL (PF) 1 % IJ SOLN
INTRAMUSCULAR | Status: DC | PRN
  Administered 2017-05-15: 13 mL via EPIDURAL

## 2017-05-15 MED ORDER — ONDANSETRON HCL 4 MG/2ML IJ SOLN: 4.0000 mg | INTRAMUSCULAR | Status: DC | PRN

## 2017-05-15 MED ORDER — SENNOSIDES-DOCUSATE SODIUM 8.6-50 MG PO TABS
2.0000 | ORAL_TABLET | ORAL | Status: DC
  Administered 2017-05-15 – 2017-05-17 (×2): 2 via ORAL
  Filled 2017-05-15 (×2): qty 2

## 2017-05-15 MED ORDER — DIPHENHYDRAMINE HCL 25 MG PO CAPS: 25.0000 mg | ORAL_CAPSULE | Freq: Four times a day (QID) | ORAL | Status: DC | PRN

## 2017-05-15 MED ORDER — OXYCODONE HCL 5 MG PO TABS: 5.0000 mg | ORAL_TABLET | ORAL | Status: DC | PRN

## 2017-05-15 MED ORDER — BENZOCAINE-MENTHOL 20-0.5 % EX AERO
1.0000 "application " | INHALATION_SPRAY | CUTANEOUS | Status: DC | PRN
  Filled 2017-05-15: qty 56

## 2017-05-15 MED ORDER — PRENATAL MULTIVITAMIN CH
1.0000 | ORAL_TABLET | Freq: Every day | ORAL | Status: DC
  Administered 2017-05-16 – 2017-05-17 (×2): 1 via ORAL
  Filled 2017-05-15 (×2): qty 1

## 2017-05-15 MED ORDER — WITCH HAZEL-GLYCERIN EX PADS: 1.0000 "application " | MEDICATED_PAD | CUTANEOUS | Status: DC | PRN

## 2017-05-15 MED ORDER — ONDANSETRON HCL 4 MG PO TABS: 4.0000 mg | ORAL_TABLET | ORAL | Status: DC | PRN

## 2017-05-15 MED ORDER — ZOLPIDEM TARTRATE 5 MG PO TABS: 5.0000 mg | ORAL_TABLET | Freq: Every evening | ORAL | Status: DC | PRN

## 2017-05-15 MED ORDER — ACETAMINOPHEN 325 MG PO TABS: 650.0000 mg | ORAL_TABLET | ORAL | Status: DC | PRN

## 2017-05-15 MED ORDER — OXYCODONE HCL 5 MG PO TABS: 10.0000 mg | ORAL_TABLET | ORAL | Status: DC | PRN

## 2017-05-15 MED ORDER — TETANUS-DIPHTH-ACELL PERTUSSIS 5-2.5-18.5 LF-MCG/0.5 IM SUSP: 0.5000 mL | Freq: Once | INTRAMUSCULAR | Status: DC

## 2017-05-15 MED ORDER — COCONUT OIL OIL
1.0000 "application " | TOPICAL_OIL | Status: DC | PRN
  Filled 2017-05-15: qty 120

## 2017-05-15 NOTE — Progress Notes (Signed)
Objective: BP (!) 95/55   Pulse 98   Temp 98.3 F (36.8 C) (Oral)   Resp 18   Ht 5\' 8"  (1.727 m)   Wt 112.5 kg (248 lb)   LMP 07/26/2016   BMI 37.71 kg/m  No intake/output data recorded. No intake/output data recorded.  FHT:  FHR: 120 bpm, variability: minimal ,  accelerations:  Present,  decelerations:  Absent UC:   regular, every 3-4 minutes SVE:   Dilation: 5 Effacement (%): 60 Station: -3 Exam by:: [employer] RN  Labs: Lab Results  Component Value Date   WBC 13.0 (H) 05/14/2017   HGB 12.4 05/14/2017   HCT 36.0 05/14/2017   MCV 88.7 05/14/2017   PLT 230 05/14/2017    Assessment / Plan: Induction of labor due topostterm. S/P FB, slight progress since last note  Labor: Progressing on Pitocin, will continue to increase then AROM. S/p cytotec and FB Preeclampsia:  N/A Fetal Wellbeing:  Category I Pain Control:  IV pain meds I/D:  n/a Anticipated MOD:  NSVD    Guadalupe Dawn 05/15/2017, 3:57 AM

## 2017-05-15 NOTE — Anesthesia Procedure Notes (Signed)
Epidural Patient location during procedure: OB Start time: 05/15/2017 11:48 AM End time: 05/15/2017 12:04 PM  Staffing Anesthesiologist: Lynda Rainwater, MD Performed: anesthesiologist   Preanesthetic Checklist Completed: patient identified, site marked, surgical consent, pre-op evaluation, timeout performed, IV checked, risks and benefits discussed and monitors and equipment checked  Epidural Patient position: sitting Prep: ChloraPrep Patient monitoring: heart rate, cardiac monitor, continuous pulse ox and blood pressure Approach: midline Location: L2-L3 Injection technique: LOR saline  Needle:  Needle type: Tuohy  Needle gauge: 17 G Needle length: 9 cm Needle insertion depth: 6 cm Catheter type: closed end flexible Catheter size: 20 Guage Catheter at skin depth: 10 cm Test dose: negative  Assessment Events: blood not aspirated, injection not painful, no injection resistance, negative IV test and no paresthesia  Additional Notes Reason for block:procedure for pain

## 2017-05-15 NOTE — Progress Notes (Signed)
Subjective: Doing well, pain well controlled. Feeling some contractions.   Objective: BP 117/78   Pulse 98   Temp 98.6 F (37 C) (Oral)   Resp 18   Ht 5\' 8"  (1.727 m)   Wt 112.5 kg (248 lb)   LMP 07/26/2016   BMI 37.71 kg/m  No intake/output data recorded. No intake/output data recorded.  FHT:  FHR: 120 bpm, variability: moderate,  accelerations:  Present,  decelerations:  Absent UC:   irregular, every 10 minutes SVE:   Dilation: 5 Effacement (%): 60 Station: -3 Exam by:: Shawna Orleans RN   Labs: Lab Results  Component Value Date   WBC 13.0 (H) 05/14/2017   HGB 12.4 05/14/2017   HCT 36.0 05/14/2017   MCV 88.7 05/14/2017   PLT 230 05/14/2017    Assessment / Plan: Induction of labor due topostterm. S/P FB,had to stop pitocin due to variable decels. Strip much improved, will restart pit at 8 and increase by 2.  Labor:Progressing on Pitocin, will continue to increase then AROM. S/p cytotec and FB Preeclampsia:N/A Fetal Wellbeing:Category I Pain Control:IV pain meds I/D:n/a Anticipated XBL:TJQZ    Guadalupe Dawn 05/15/2017, 7:45 AM

## 2017-05-15 NOTE — Anesthesia Preprocedure Evaluation (Signed)

## 2017-05-16 NOTE — Lactation Note (Signed)
This note was copied from a baby's chart. Lactation Consultation Note  Patient Name: Heather Durham [id number] Date: 05/16/2017 Reason for consult: Initial assessment;Primapara;1st time breastfeeding;Term  G1P1 mother whose infant is now 60 hours old.    Mother attempting to latch infant on as I entered the room.  Offered to assist with latch and mother accepted.  Mother has large soft compressible breast tissue with short shaft nipples.  After repeated attempts infant was able to latch onto the right breast in the football hold.  Encouraged mother to wait for a wide open mouth as she wants to try to force the nipple into baby's mouth.  Infant has strong sucks and mother needs encouragement to hold baby into breast so she does not lose her latch.  Showed mother how she can do breast massage during feeds as well as hand expression.  Shells and manual pump provided with instructions for use to help evert short nipples.  Size #24 appropriate at this time.  Cleaning of parts discussed.  Encouraged STS, breast massage, hand expression and pre-pumping with manual pump before latching baby.  Feed 8-12 times/24 hours or earlier if she shows feeding cues.  Infant is very alert and mother reminded to continue assessing her latch since it will be easy for baby to lose deep latch.  Mother verbalized understanding.  Mom made aware of O/P services, breastfeeding support groups, community resources, and our phone # for post-discharge questions. Support person present and sleeping. Maternal Data Formula Feeding for Exclusion: No Has patient been taught Hand Expression?: Yes Does the patient have breastfeeding experience prior to this delivery?: No  Feeding Feeding Type: Breast Fed Length of feed: 15 min(still feeding)  LATCH Score Latch: Repeated attempts needed to sustain latch, nipple held in mouth throughout feeding, stimulation needed to elicit sucking reflex.  Audible Swallowing: A few with  stimulation  Type of Nipple: Everted at rest and after stimulation  Comfort (Breast/Nipple): Soft / non-tender  Hold (Positioning): Assistance needed to correctly position infant at breast and maintain latch.  LATCH Score: 7  Interventions Interventions: Breast feeding basics reviewed;Assisted with latch;Skin to skin;Breast massage;Hand express;Position options;Support pillows;Adjust position;Breast compression;Shells;Hand pump  Lactation Tools Discussed/Used Tools: Shells;Pump Shell Type: Inverted Breast pump type: Manual Initiated by:: Paul Dykes Date initiated:: 05/16/17   Consult Status Consult Status: Follow-up Date: 05/17/17 Follow-up type: In-patient    Marcelo Ickes R Jaylynn Mcaleer 05/16/2017, 3:23 AM

## 2017-05-16 NOTE — Progress Notes (Addendum)
Post Partum Day 1 Subjective: Ms. Pfahler is a 22 yo female G1P1 that is here for SVD after IOL for postdates. She is GBS negative and Rh pos. Her pregnancy was complicated with Chlamydial infection and test of cure was negative on 04/10/17.  She has a SVD with an ESBL of 100, She had a repair of a second degree laceration of the posterior vaginal wall/sulcus.   This morning she has no complaints, pain well controlled, tolerating PO, + flatus, - BM, and bleeding is improving, Patient plans on breastfeeding and use OCPs for contraception.   Objective: Blood pressure 115/71, pulse 76, temperature 98.2 F (36.8 C), temperature source Oral, resp. rate 18, height 5\' 8"  (1.727 m), weight 112.5 kg (248 lb), last menstrual period 07/26/2016, SpO2 99 %, unknown if currently breastfeeding.  Physical Exam:  General: alert and cooperative Lochia: appropriate Uterine Fundus: firm Incision: N/A DVT Evaluation: No evidence of DVT seen on physical exam.  Recent Labs    05/14/17 0802  HGB 12.4  HCT 36.0    Assessment/Plan: Plan for discharge tomorrow, Breastfeeding and Contraception OCPs   LOS: 2 days   Ofilia Neas 05/16/2017, 7:30 AM   OB FELLOW MEDICAL STUDENT NOTE ATTESTATION I confirm that I have verified the information documented in the medical student's note and that I have also personally performed the physical exam and all medical decision making activities.  Patient doing well PPD#1 from SVD VS wnl. Fundus firm Plan to d/c home tomorrow  Gailen Shelter, MD OB Fellow 05/16/2017, 8:54 AM

## 2017-05-16 NOTE — Anesthesia Postprocedure Evaluation (Signed)
Anesthesia Post Note  Patient: Heather Durham  Procedure(s) Performed: AN AD HOC LABOR EPIDURAL     Patient location during evaluation: Mother Baby Anesthesia Type: Epidural Level of consciousness: awake Pain management: satisfactory to patient Vital Signs Assessment: post-procedure vital signs reviewed and stable Respiratory status: spontaneous breathing Cardiovascular status: stable Anesthetic complications: no    Last Vitals:  Vitals:   05/16/17 0651 05/16/17 0733  BP: 115/71 113/72  Pulse: 76 70  Resp: 18   Temp: 36.8 C 36.8 C  SpO2:  100%    Last Pain:  Vitals:   05/16/17 0651  TempSrc: Oral  PainSc: 0-No pain   Pain Goal:                 [employer]

## 2017-05-17 MED ORDER — IBUPROFEN 600 MG PO TABS: 600.0000 mg | ORAL_TABLET | Freq: Four times a day (QID) | ORAL | 0 refills | Status: DC

## 2017-05-17 NOTE — Lactation Note (Signed)
This note was copied from a baby's chart. Lactation Consultation Note; Baby awake and rooting as I went into room. Offered assist and mom agreeable. Baby latched well with swallows noted. Mom reports some pain with initial latch that then eases off. Nipples slightly raw on tips. Encouraged hand expression and rubbing EBM into nipple after nursing. Encouragement given. Mom going back to work in June, does not have [place]- encouraged to talk to [facility] about one.  No questions at present. Reviewed our phone number, OP appointments and BFS[facility] as resources for support after DC. To call prn  Patient Name: Heather Durham [id number] Date: 05/17/2017 Reason for consult: Follow-up assessment   Maternal Data Formula Feeding for Exclusion: No Has patient been taught Hand Expression?: Yes Does the patient have breastfeeding experience prior to this delivery?: No  Feeding Feeding Type: Breast Fed Length of feed: 15 min  LATCH Score Latch: Grasps breast easily, tongue down, lips flanged, rhythmical sucking.  Audible Swallowing: A few with stimulation  Type of Nipple: Everted at rest and after stimulation  Comfort (Breast/Nipple): Filling, red/small blisters or bruises, mild/mod discomfort  Hold (Positioning): Assistance needed to correctly position infant at breast and maintain latch.  LATCH Score: 7  Interventions Interventions: Breast feeding basics reviewed;Breast massage;Hand express  Lactation Tools Discussed/Used WIC Program: Yes   Consult Status Consult Status: Complete Date: 05/17/17    [name] 05/17/2017, 9:43 AM

## 2017-05-17 NOTE — Lactation Note (Signed)
This note was copied from a baby's chart. Lactation Consultation Note Baby 70 hrs old. Mom plans on being d/c home today. States BF going well. Mom is nervous in general in going home w/newborn. Mom has [place]. Will be f/u there.  Discussed mature milk, breast filling, engorgement, prevention, milk storage, breast massage, I&O, STS, supply and demand. Encouraged to call for questions or concerns. Reminded of outside recourses and OP [place] services.  Baby has been having good feedings and output. Mom asked about getting top and bottom lips flanged out more when she clamps sometimes. LC discussed w/mom.  Patient Name: Heather Durham [id number] Date: 05/17/2017 Reason for consult: Follow-up assessment   Maternal Data    Feeding    LATCH Score       Type of Nipple: Everted at rest and after stimulation  Comfort (Breast/Nipple): Soft / non-tender        Interventions Interventions: Breast feeding basics reviewed;Support pillows;Position options;Skin to skin;Expressed milk;Breast massage;Hand express;Hand pump;Breast compression  Lactation Tools Discussed/Used     Consult Status Consult Status: Complete Date: 05/17/17    Theodoro Kalata 05/17/2017, 5:46 AM

## 2017-05-17 NOTE — Discharge Instructions (Signed)
Vaginal Delivery, Care After °Refer to this sheet in the next few weeks. These instructions provide you with information about caring for yourself after vaginal delivery. Your health care provider may also give you more specific instructions. Your treatment has been planned according to current medical practices, but problems sometimes occur. Call your health care provider if you have any problems or questions. °What can I expect after the procedure? °After vaginal delivery, it is common to have: °· Some bleeding from your vagina. °· Soreness in your abdomen, your vagina, and the area of skin between your vaginal opening and your anus (perineum). °· Pelvic cramps. °· Fatigue. ° °Follow these instructions at home: °Medicines °· Take over-the-counter and prescription medicines only as told by your health care provider. °· If you were prescribed an antibiotic medicine, take it as told by your health care provider. Do not stop taking the antibiotic until it is finished. °Driving ° °· Do not drive or operate heavy machinery while taking prescription pain medicine. °· Do not drive for 24 hours if you received a sedative. °Lifestyle °· Do not drink alcohol. This is especially important if you are breastfeeding or taking medicine to relieve pain. °· Do not use tobacco products, including cigarettes, chewing tobacco, or e-cigarettes. If you need help quitting, ask your health care provider. °Eating and drinking °· Drink at least 8 eight-ounce glasses of water every day unless you are told not to by your health care provider. If you choose to breastfeed your baby, you may need to drink more water than this. °· Eat high-fiber foods every day. These foods may help prevent or relieve constipation. High-fiber foods include: °? Whole grain cereals and breads. °? Brown rice. °? Beans. °? Fresh fruits and vegetables. °Activity °· Return to your normal activities as told by your health care provider. Ask your health care provider  what activities are safe for you. °· Rest as much as possible. Try to rest or take a nap when your baby is sleeping. °· Do not lift anything that is heavier than your baby or 10 lb (4.5 kg) until your health care provider says that it is safe. °· Talk with your health care provider about when you can engage in sexual activity. This may depend on your: °? Risk of infection. °? Rate of healing. °? Comfort and desire to engage in sexual activity. °Vaginal Care °· If you have an episiotomy or a vaginal tear, check the area every day for signs of infection. Check for: °? More redness, swelling, or pain. °? More fluid or blood. °? Warmth. °? Pus or a bad smell. °· Do not use tampons or douches until your health care provider says this is safe. °· Watch for any blood clots that may pass from your vagina. These may look like clumps of dark red, brown, or black discharge. °General instructions °· Keep your perineum clean and dry as told by your health care provider. °· Wear loose, comfortable clothing. °· Wipe from front to back when you use the toilet. °· Ask your health care provider if you can shower or take a bath. If you had an episiotomy or a perineal tear during labor and delivery, your health care provider may tell you not to take baths for a certain length of time. °· Wear a bra that supports your breasts and fits you well. °· If possible, have someone help you with household activities and help care for your baby for at least a few days after   you leave the hospital. °· Keep all follow-up visits for you and your baby as told by your health care provider. This is important. °Contact a health care provider if: °· You have: °? Vaginal discharge that has a bad smell. °? Difficulty urinating. °? Pain when urinating. °? A sudden increase or decrease in the frequency of your bowel movements. °? More redness, swelling, or pain around your episiotomy or vaginal tear. °? More fluid or blood coming from your episiotomy or  vaginal tear. °? Pus or a bad smell coming from your episiotomy or vaginal tear. °? A fever. °? A rash. °? Little or no interest in activities you used to enjoy. °? Questions about caring for yourself or your baby. °· Your episiotomy or vaginal tear feels warm to the touch. °· Your episiotomy or vaginal tear is separating or does not appear to be healing. °· Your breasts are painful, hard, or turn red. °· You feel unusually sad or worried. °· You feel nauseous or you vomit. °· You pass large blood clots from your vagina. If you pass a blood clot from your vagina, save it to show to your health care provider. Do not flush blood clots down the toilet without having your health care provider look at them. °· You urinate more than usual. °· You are dizzy or light-headed. °· You have not breastfed at all and you have not had a menstrual period for 12 weeks after delivery. °· You have stopped breastfeeding and you have not had a menstrual period for 12 weeks after you stopped breastfeeding. °Get help right away if: °· You have: °? Pain that does not go away or does not get better with medicine. °? Chest pain. °? Difficulty breathing. °? Blurred vision or spots in your vision. °? Thoughts about hurting yourself or your baby. °· You develop pain in your abdomen or in one of your legs. °· You develop a severe headache. °· You faint. °· You bleed from your vagina so much that you fill two sanitary pads in one hour. °This information is not intended to replace advice given to you by your health care provider. Make sure you discuss any questions you have with your health care provider. °Document Released: 12/30/1999 Document Revised: 06/15/2015 Document Reviewed: 01/16/2015 °Elsevier Interactive Patient Education © 2018 [employer] Inc[employer] ° °

## 2017-05-17 NOTE — Discharge Summary (Signed)
OB Discharge Summary     Patient Name: Heather Durham DOB: December 23, 1995 MRN: [id number]  Date of admission: 05/14/2017 Delivering MD: Fatima Blank A   Date of discharge: 05/17/2017  Admitting diagnosis: INDUCTION Intrauterine pregnancy: [redacted]w[redacted]d     Secondary diagnosis:  Active Problems:   Post-dates pregnancy   NSVD (normal spontaneous vaginal delivery)  Additional problems: none     Discharge diagnosis: Term Pregnancy Delivered                                                                                                Post partum procedures:na  Augmentation: AROM, Cytotec and Foley Balloon  Complications: None  Hospital course:  Onset of Labor With Vaginal Delivery     22 y.o. yo G1P1001 at [redacted]w[redacted]d was admitted in Latent Labor on 05/14/2017. Patient had an uncomplicated labor course as follows:  Membrane Rupture Time/Date: 10:05 AM ,05/15/2017   Intrapartum Procedures: Episiotomy: None [1]                                         Lacerations:  2nd degree [3];Sulcus [9]  Patient had a delivery of a Viable infant. 05/15/2017  Information for the patient's newborn:  Giulianna, Rocha Girl Nou [[id number]]  Delivery Method: Vaginal, Spontaneous(Filed from Delivery Summary)    Pateint had an uncomplicated postpartum course.  She is ambulating, tolerating a regular diet, passing flatus, and urinating well. Patient is discharged home in stable condition on 05/17/17.   Physical exam  Vitals:   05/16/17 0733 05/16/17 1753 05/16/17 2008 05/17/17 0645  BP: 113/72  122/82 119/75  Pulse: 70  78 71  Resp:  18 18 18   Temp: 98.2 F (36.8 C)  97.9 F (36.6 C) (!) 97.5 F (36.4 C)  TempSrc:   Oral Oral  SpO2: 100%     Weight:      Height:       General: alert, cooperative and no distress Lochia: appropriate Uterine Fundus: firm Incision: N/A DVT Evaluation: No evidence of DVT seen on physical exam. Negative Homan's sign. Labs: Lab Results  Component Value Date   WBC 13.0 (H)  05/14/2017   HGB 12.4 05/14/2017   HCT 36.0 05/14/2017   MCV 88.7 05/14/2017   PLT 230 05/14/2017   CMP Latest Ref Rng & Units 10/09/2016  Glucose 65 - 99 mg/dL 73  BUN 6 - 20 mg/dL 9  Creatinine 0.57 - 1.00 mg/dL 0.65  Sodium 134 - 144 mmol/L 137  Potassium 3.5 - 5.2 mmol/L 3.9  Chloride 96 - 106 mmol/L 100  CO2 20 - 29 mmol/L 21  Calcium 8.7 - 10.2 mg/dL 9.4  Total Protein 6.0 - 8.5 g/dL 6.7  Total Bilirubin 0.0 - 1.2 mg/dL <0.2  Alkaline Phos 39 - 117 IU/L 57  AST 0 - 40 IU/L 17  ALT 0 - 32 IU/L 10    Discharge instruction: per After Visit Summary and "Baby and Me Booklet".  After visit meds:  Allergies as of 05/17/2017  No Known Allergies     Medication List    STOP taking these medications   calcium carbonate 500 MG chewable tablet Commonly known as:  TUMS - dosed in mg elemental calcium   Prenatal Vitamin 27-0.8 MG Tabs     TAKE these medications   ibuprofen 600 MG tablet Commonly known as:  ADVIL,MOTRIN Take 1 tablet (600 mg total) by mouth every 6 (six) hours.       Diet: routine diet  Activity: Advance as tolerated. Pelvic rest for 6 weeks.   Outpatient follow up:6 weeks Follow up Appt: Future Appointments  Date Time Provider [place]  06/26/2017 10:30 AM Caren Macadam, MD [facility]   Follow up Visit:No follow-ups on file.  Postpartum contraception: Combination OCPs  Newborn Data: Live born female  Birth Weight: 9 lb 1 oz (4110 g) APGAR: 61, 9  Newborn Delivery   Birth date/time:  05/15/2017 15:31:00 Delivery type:  Vaginal, Spontaneous     Baby Feeding: Breast Disposition:home with mother   05/17/2017 Guadalupe Dawn, MD

## 2017-06-24 ENCOUNTER — Encounter: Payer: Self-pay | Admitting: Family Medicine

## 2017-06-24 ENCOUNTER — Other Ambulatory Visit (HOSPITAL_COMMUNITY)
Admission: RE | Admit: 2017-06-24 | Discharge: 2017-06-24 | Disposition: A | Discharge status: Home / Self Care | Payer: Medicaid Other | Source: Ambulatory Visit | Attending: Family Medicine | Admitting: Family Medicine

## 2017-06-24 ENCOUNTER — Ambulatory Visit (INDEPENDENT_AMBULATORY_CARE_PROVIDER_SITE_OTHER): Payer: Medicaid Other | Admitting: Family Medicine

## 2017-06-24 DIAGNOSIS — N898 Other specified noninflammatory disorders of vagina: Secondary | ICD-10-CM | POA: Diagnosis present

## 2017-06-24 DIAGNOSIS — Z30011 Encounter for initial prescription of contraceptive pills: Secondary | ICD-10-CM

## 2017-06-24 MED ORDER — NORETHINDRONE 0.35 MG PO TABS: 1.0000 | ORAL_TABLET | Freq: Every day | ORAL | 3 refills | Status: DC

## 2017-06-24 NOTE — Progress Notes (Signed)
Post Partum Exam  Heather Durham is a 22 y.o. G63P1001 female who presents for a postpartum visit. She is five weeks  postpartum following a  Vaginal delivery: I have fully reviewed the prenatal and intrapartum course. The delivery was at 18 gestational weeks.  Anesthesia: vaginal. Postpartum course has been uncomplicated. Baby's course has been uncomplicated. Baby is feeding by breast. Bleeding not at this time. Bowel function is normal. Bladder function is normal. Patient is not sexually active. Contraception method is nothing at this time but desires birth control pills.. Postpartum depression screening:neg  Review of Systems Pertinent items noted in HPI and remainder of comprehensive ROS otherwise negative.    Objective:  unknown if currently breastfeeding.  General:  alert, cooperative and appears stated age  Lungs: normal effort  Heart:  regular rate and rhythm  Abdomen: soft, non-tender; bowel sounds normal; no masses,  no organomegaly        Assessment:    Normal postpartum exam. Pap smear not done at today's visit.   Plan:   1. Contraception: oral progesterone-only contraceptive 2. Pap is not due as she is only 22 yo 3. Follow up in: 1 year or as needed.

## 2017-06-24 NOTE — Patient Instructions (Signed)
Oral Contraception Information Oral contraceptive pills (OCPs) are medicines taken to prevent pregnancy. OCPs work by preventing the ovaries from releasing eggs. The hormones in OCPs also cause the cervical mucus to thicken, preventing the sperm from entering the uterus. The hormones also cause the uterine lining to become thin, not allowing a fertilized egg to attach to the inside of the uterus. OCPs are highly effective when taken exactly as prescribed. However, OCPs do not prevent sexually transmitted diseases (STDs). Safe sex practices, such as using condoms along with the pill, can help prevent STDs. Before taking the pill, you may have a physical exam and Pap test. Your health care provider may order blood tests. The health care provider will make sure you are a good candidate for oral contraception. Discuss with your health care provider the possible side effects of the OCP you may be prescribed. When starting an OCP, it can take 2 to 3 months for the body to adjust to the changes in hormone levels in your body. Types of oral contraception  The combination pill-This pill contains estrogen and progestin (synthetic progesterone) hormones. The combination pill comes in 21-day, 28-day, or 91-day packs. Some types of combination pills are meant to be taken continuously (365-day pills). With 21-day packs, you do not take pills for 7 days after the last pill. With 28-day packs, the pill is taken every day. The last 7 pills are without hormones. Certain types of pills have more than 21 hormone-containing pills. With 91-day packs, the first 84 pills contain both hormones, and the last 7 pills contain no hormones or contain estrogen only.  The minipill-This pill contains the progesterone hormone only. The pill is taken every day continuously. It is very important to take the pill at the same time each day. The minipill comes in packs of 28 pills. All 28 pills contain the hormone. Advantages of oral  contraceptive pills  Decreases premenstrual symptoms.  Treats menstrual period cramps.  Regulates the menstrual cycle.  Decreases a heavy menstrual flow.  May treatacne, depending on the type of pill.  Treats abnormal uterine bleeding.  Treats polycystic ovarian syndrome.  Treats endometriosis.  Can be used as emergency contraception. Things that can make oral contraceptive pills less effective OCPs can be less effective if:  You forget to take the pill at the same time every day.  You have a stomach or intestinal disease that lessens the absorption of the pill.  You take OCPs with other medicines that make OCPs less effective, such as antibiotics, certain HIV medicines, and some seizure medicines.  You take expired OCPs.  You forget to restart the pill on day 7, when using the packs of 21 pills.  Risks associated with oral contraceptive pills Oral contraceptive pills can sometimes cause side effects, such as:  Headache.  Nausea.  Breast tenderness.  Irregular bleeding or spotting.  Combination pills are also associated with a small increased risk of:  Blood clots.  Heart attack.  Stroke.  This information is not intended to replace advice given to you by your health care provider. Make sure you discuss any questions you have with your health care provider. Document Released: 03/24/2002 Document Revised: 06/09/2015 Document Reviewed: 06/22/2012 Elsevier Interactive Patient Education  2018 Elsevier Inc.  

## 2017-06-25 LAB — CERVICOVAGINAL ANCILLARY ONLY
BACTERIAL VAGINITIS: POSITIVE — AB
CANDIDA VAGINITIS: NEGATIVE
Trichomonas: NEGATIVE

## 2017-06-26 ENCOUNTER — Ambulatory Visit: Payer: Medicaid Other | Admitting: Family Medicine

## 2017-06-26 ENCOUNTER — Other Ambulatory Visit: Payer: Self-pay | Admitting: Family Medicine

## 2017-06-26 MED ORDER — CLINDAMYCIN PHOSPHATE 2 % VA CREA: 1.0000 | TOPICAL_CREAM | Freq: Every day | VAGINAL | 0 refills | Status: DC

## 2017-06-27 ENCOUNTER — Telehealth: Payer: Self-pay

## 2017-06-27 DIAGNOSIS — B9689 Other specified bacterial agents as the cause of diseases classified elsewhere: Secondary | ICD-10-CM

## 2017-06-27 DIAGNOSIS — N760 Acute vaginitis: Principal | ICD-10-CM

## 2017-06-27 MED ORDER — METRONIDAZOLE 0.75 % VA GEL: 1.0000 | Freq: Every day | VAGINAL | 1 refills | Status: DC

## 2017-06-27 NOTE — Telephone Encounter (Signed)
Insurance does not want to pay for her clindamyin vaginal cream to treat BV. Is there something else you want to called in for this patient?

## 2017-06-27 NOTE — Addendum Note (Signed)
Addended by: Donnamae Jude on: 06/27/2017 01:44 PM   Modules accepted: Orders

## 2018-04-23 ENCOUNTER — Emergency Department (HOSPITAL_COMMUNITY)
Admission: EM | Admit: 2018-04-23 | Discharge: 2018-04-23 | Disposition: A | Discharge status: Home / Self Care | Payer: Medicaid Other | Attending: Emergency Medicine | Admitting: Emergency Medicine

## 2018-04-23 ENCOUNTER — Other Ambulatory Visit: Payer: Self-pay

## 2018-04-23 ENCOUNTER — Encounter (HOSPITAL_COMMUNITY): Payer: Self-pay | Admitting: Student

## 2018-04-23 DIAGNOSIS — Z79899 Other long term (current) drug therapy: Secondary | ICD-10-CM | POA: Diagnosis not present

## 2018-04-23 DIAGNOSIS — H6121 Impacted cerumen, right ear: Secondary | ICD-10-CM | POA: Insufficient documentation

## 2018-04-23 NOTE — ED Provider Notes (Signed)
[facility] EMERGENCY DEPARTMENT Provider Note   CSN: [id number] Arrival date & time: 04/23/18  1818    History   Chief Complaint Chief Complaint  Patient presents with  . Ear Fullness    HPI Heather Durham is a 23 y.o. female with a hx of prior chlamydia & trichomonas who presents to the ED with complaints of R ear fullness which she woke up with this AM. Patient states R ear feels full/clogged with muffled hearing. No alleviating/aggravating factors. Tried washing with water without relief. Denies ear pain, ear drainage, fever, chills, congestion, or sore throat.      HPI  Past Medical History:  Diagnosis Date  . Chlamydia   . Trichomonas infection   . UTI (urinary tract infection)     Patient Active Problem List   Diagnosis Date Noted  . 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 FOOT SURGERY Right      OB History    Gravida  1   Para  1   Term  1   Preterm  0   AB  0   Living  1     SAB  0   TAB  0   Ectopic  0   Multiple  0   Live Births  1            Home Medications    Prior to Admission medications   Medication Sig Start Date End Date Taking? Authorizing Provider  ibuprofen (ADVIL,MOTRIN) 600 MG tablet Take 1 tablet (600 mg total) by mouth every 6 (six) hours. Patient not taking: Reported on 06/24/2017 05/17/17   Guadalupe Dawn, MD  metroNIDAZOLE (METROGEL) 0.75 % vaginal gel Place 1 Applicatorful vaginally at bedtime. Apply one applicatorful to vagina at bedtime for 5 days 06/27/17   Donnamae Jude, MD  norethindrone (MICRONOR,CAMILA,ERRIN) 0.35 MG tablet Take 1 tablet (0.35 mg total) by mouth daily. 06/24/17   Donnamae Jude, MD    Family History No family history on file.  Social History Social History   Tobacco Use  . Smoking status: Never Smoker  . Smokeless tobacco: Never Used  Substance Use Topics  . Alcohol use: No  . Drug use: No     Allergies   Patient  has no known allergies.   Review of Systems Review of Systems  Constitutional: Negative for chills and fever.  HENT: Negative for congestion, ear discharge, ear pain, rhinorrhea and sore throat.        + for R ear fullness/muffled hearing.   Respiratory: Negative for cough and shortness of breath.      Physical Exam Updated Vital Signs There were no vitals taken for this visit.  Physical Exam Vitals signs and nursing note reviewed.  Constitutional:      General: She is not in acute distress.    Appearance: She is well-developed.  HENT:     Head: Normocephalic and atraumatic.     Ears:     Comments: Cerumen present in bilateral EACs. R TM obstructed. L TM able to be visualized some and appears normal.  No mastoid erythema/swelling/tenderness.     Nose: Nose normal.     Mouth/Throat:     Mouth: Mucous membranes are moist.  Eyes:     General:        Right eye: No discharge.        Left eye: No discharge.     Conjunctiva/sclera: Conjunctivae  normal.  Cardiovascular:     Rate and Rhythm: Normal rate.  Pulmonary:     Effort: Pulmonary effort is normal.  Neurological:     Mental Status: She is alert.     Comments: Clear speech.   Psychiatric:        Behavior: Behavior normal.        Thought Content: Thought content normal.      ED Treatments / Results  Labs (all labs ordered are listed, but only abnormal results are displayed) Labs Reviewed - No data to display  EKG None  Radiology No results found.  Procedures .Ear Cerumen Removal Date/Time: 04/23/2018 8:03 PM Performed by: Amaryllis Dyke, PA-C Authorized by: Amaryllis Dyke, PA-C   Consent:    Consent obtained:  Verbal   Consent given by:  Patient   Risks discussed:  Bleeding, dizziness, incomplete removal, infection, pain and TM perforation   Alternatives discussed:  No treatment Procedure details:    Location:  R ear   Procedure type: irrigation   Post-procedure details:     Inspection:  TM intact   Hearing quality:  Normal   Patient tolerance of procedure:  Tolerated well, no immediate complications   (including critical care time)  Medications Ordered in ED Medications - No data to display   Initial Impression / Assessment and Plan / ED Course  I have reviewed the triage vital signs and the nursing notes.  Pertinent labs & imaging results that were available during my care of the patient were reviewed by me and considered in my medical decision making (see chart for details).   Patient presents w/ R ear fullness/muffled hearing, cerumen present on exam, resolution of sxs s/p irrigation. No signs of AOM, AOE, mastoiditis, or TM perf. Discharge home with PCP follow up & return precautions. I discussed treatment plan, need for follow-up, and return precautions with the patient. Provided opportunity for questions, patient confirmed understanding and is in agreement with plan.    Final Clinical Impressions(s) / ED Diagnoses   Final diagnoses:  Impacted cerumen of right ear    ED Discharge Orders    None       Leafy Kindle 04/23/18 2004    Sherwood Gambler, MD 04/25/18 [phone]

## 2018-04-23 NOTE — ED Triage Notes (Signed)
C/o right ear pain starting this AM, no known fever or any other symptoms of sickness

## 2018-04-23 NOTE — ED Notes (Signed)
Flushed right ear with water and peroxide. Small amount of wax removed from ear.

## 2018-04-23 NOTE — ED Notes (Signed)
Patient verbalizes understanding of discharge instructions. Opportunity for questioning and answers were provided. Armband removed by staff, pt discharged from ED.  

## 2018-04-23 NOTE — Discharge Instructions (Signed)
You were seen in the emergency department today for ear fullness.  Your exam showed that you have wax in your ears.  This was removed with irrigation.  Please see the attached handouts.  Follow-up with primary care within 1 week.  Return to the ER for ear pain, loss of hearing, fever, or any other concerns.

## 2019-05-25 ENCOUNTER — Emergency Department (HOSPITAL_COMMUNITY)
Admission: EM | Admit: 2019-05-25 | Discharge: 2019-05-25 | Disposition: A | Discharge status: Home / Self Care | Payer: Medicaid Other | Attending: Emergency Medicine | Admitting: Emergency Medicine

## 2019-05-25 ENCOUNTER — Encounter (HOSPITAL_COMMUNITY): Payer: Self-pay | Admitting: Emergency Medicine

## 2019-05-25 DIAGNOSIS — Z202 Contact with and (suspected) exposure to infections with a predominantly sexual mode of transmission: Secondary | ICD-10-CM | POA: Diagnosis not present

## 2019-05-25 DIAGNOSIS — Z79899 Other long term (current) drug therapy: Secondary | ICD-10-CM | POA: Diagnosis not present

## 2019-05-25 DIAGNOSIS — Z711 Person with feared health complaint in whom no diagnosis is made: Secondary | ICD-10-CM

## 2019-05-25 LAB — WET PREP, GENITAL
Sperm: NONE SEEN
Trich, Wet Prep: NONE SEEN
Yeast Wet Prep HPF POC: NONE SEEN

## 2019-05-25 LAB — URINALYSIS, ROUTINE W REFLEX MICROSCOPIC
Bilirubin Urine: NEGATIVE
Glucose, UA: NEGATIVE mg/dL
Hgb urine dipstick: NEGATIVE
Ketones, ur: NEGATIVE mg/dL
Nitrite: NEGATIVE
Protein, ur: NEGATIVE mg/dL
Specific Gravity, Urine: 1.02 (ref 1.005–1.030)
pH: 5.5 (ref 5.0–8.0)

## 2019-05-25 LAB — URINALYSIS, MICROSCOPIC (REFLEX): RBC / HPF: NONE SEEN RBC/hpf (ref 0–5)

## 2019-05-25 LAB — PREGNANCY, URINE: Preg Test, Ur: NEGATIVE

## 2019-05-25 MED ORDER — CEFTRIAXONE SODIUM 500 MG IJ SOLR
500.0000 mg | Freq: Once | INTRAMUSCULAR | Status: AC
  Administered 2019-05-25: 500 mg via INTRAMUSCULAR
  Filled 2019-05-25: qty 500

## 2019-05-25 MED ORDER — DOXYCYCLINE HYCLATE 100 MG PO TABS
100.0000 mg | ORAL_TABLET | Freq: Once | ORAL | Status: AC
  Administered 2019-05-25: 100 mg via ORAL
  Filled 2019-05-25: qty 1

## 2019-05-25 MED ORDER — LIDOCAINE HCL (PF) 1 % IJ SOLN
INTRAMUSCULAR | Status: AC
  Administered 2019-05-25: 5 mL
  Filled 2019-05-25: qty 5

## 2019-05-25 MED ORDER — DOXYCYCLINE HYCLATE 100 MG PO CAPS
100.0000 mg | ORAL_CAPSULE | Freq: Two times a day (BID) | ORAL | 0 refills | Status: AC
Start: 2019-05-25 — End: 2019-06-01

## 2019-05-25 NOTE — ED Provider Notes (Signed)
[facility] EMERGENCY DEPARTMENT Provider Note   CSN: [id number] Arrival date & time: 05/25/19  1058     History Chief Complaint  Patient presents with  . Exposure to STD    Heather Durham is a 24 y.o. female who presents for evaluation of possible STD.  Patient reports that she was notified the other day and told her that her partner of hers was positive for chlamydia.  Patient states she is not having any symptoms but wants to be tested for everything.  Patient reports she is currently sexually active with 1 partner.  She states that the last time, they did not use any protection.  She denies any abdominal pain, dysuria, vaginal discharge.    The history is provided by the patient.       Past Medical History:  Diagnosis Date  . Chlamydia   . Trichomonas infection   . UTI (urinary tract infection)     Patient Active Problem List   Diagnosis Date Noted  . 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 FOOT SURGERY Right      OB History    Gravida  1   Para  1   Term  1   Preterm  0   AB  0   Living  1     SAB  0   TAB  0   Ectopic  0   Multiple  0   Live Births  1           No family history on file.  Social History   Tobacco Use  . Smoking status: Never Smoker  . Smokeless tobacco: Never Used  Substance Use Topics  . Alcohol use: No  . Drug use: No    Home Medications Prior to Admission medications   Medication Sig Start Date End Date Taking? Authorizing Provider  doxycycline (VIBRAMYCIN) 100 MG capsule Take 1 capsule (100 mg total) by mouth 2 (two) times daily for 7 days. 05/25/19 06/01/19  Volanda Napoleon, PA-C  metroNIDAZOLE (METROGEL) 0.75 % vaginal gel Place 1 Applicatorful vaginally at bedtime. Apply one applicatorful to vagina at bedtime for 5 days 06/27/17   Donnamae Jude, MD  norethindrone (MICRONOR,CAMILA,ERRIN) 0.35 MG tablet Take 1 tablet (0.35 mg total) by mouth  daily. 06/24/17   Donnamae Jude, MD    Allergies    Patient has no known allergies.  Review of Systems   Review of Systems  Gastrointestinal: Negative for abdominal pain.  Genitourinary: Negative for dysuria and vaginal discharge.  All other systems reviewed and are negative.   Physical Exam Updated Vital Signs BP 122/67 (BP Location: Right Arm)   Pulse 80   Temp 98.8 F (37.1 C) (Oral)   Resp 16   SpO2 93%   Physical Exam Vitals and nursing note reviewed. Exam conducted with a chaperone present.  Constitutional:      Appearance: She is well-developed.  HENT:     Head: Normocephalic and atraumatic.  Eyes:     General: No scleral icterus.       Right eye: No discharge.        Left eye: No discharge.     Conjunctiva/sclera: Conjunctivae normal.  Pulmonary:     Effort: Pulmonary effort is normal.  Abdominal:     Comments: Abdomen is soft, non-distended, non-tender. No rigidity, No guarding. No peritoneal signs.  Genitourinary:    Cervix: Discharge present. No cervical  motion tenderness.     Adnexa:        Right: No mass or tenderness.         Left: No mass or tenderness.       Comments: The exam was performed with a chaperone present. Normal external female genitalia. No lesions, rash, or sores.  No CMT.  No adnexal mass or tenderness noted.  Small amount of discharge noted from cervix.  No bleeding. Skin:    General: Skin is warm and dry.  Neurological:     Mental Status: She is alert.  Psychiatric:        Speech: Speech normal.        Behavior: Behavior normal.     ED Results / Procedures / Treatments   Labs (all labs ordered are listed, but only abnormal results are displayed) Labs Reviewed  WET PREP, GENITAL - Abnormal; Notable for the following components:      Result Value   Clue Cells Wet Prep HPF POC PRESENT (*)    WBC, Wet Prep HPF POC MODERATE (*)    All other components within normal limits  URINALYSIS, ROUTINE W REFLEX MICROSCOPIC - Abnormal;  Notable for the following components:   Leukocytes,Ua SMALL (*)    All other components within normal limits  URINALYSIS, MICROSCOPIC (REFLEX) - Abnormal; Notable for the following components:   Bacteria, UA MANY (*)    All other components within normal limits  PREGNANCY, URINE  RPR  HIV ANTIBODY (ROUTINE TESTING W REFLEX)  RPR  HIV ANTIBODY (ROUTINE TESTING W REFLEX)  GC/CHLAMYDIA PROBE AMP ([place]) NOT AT [facility]  WET PREP  (BD AFFIRM) ([place])  WET PREP  (BD AFFIRM) ([place])  GC/CHLAMYDIA PROBE AMP ([place]) NOT AT [facility]    EKG None  Radiology No results found.  Procedures Procedures (including critical care time)  Medications Ordered in ED Medications  cefTRIAXone (ROCEPHIN) injection 500 mg (500 mg Intramuscular Given 05/25/19 1546)  doxycycline (VIBRA-TABS) tablet 100 mg (100 mg Oral Given 05/25/19 1546)  lidocaine (PF) (XYLOCAINE) 1 % injection (5 mLs  Given 05/25/19 1546)    ED Course  I have reviewed the triage vital signs and the nursing notes.  Pertinent labs & imaging results that were available during my care of the patient were reviewed by me and considered in my medical decision making (see chart for details).    MDM Rules/Calculators/A&P                      24 y.o. F who presents for evaluation for concern for possible STD.  Patient reports that she received a test message saying that her partner recently tested positive for chlamydia.  She is asymptomatic at this time. Patient is afebrile, non-toxic appearing, sitting comfortably on examination table. Vital signs reviewed and stable. GU exam shows no evidence of rash or lesions. STD cultures pending. Wet prep revealed clue cells.  Patient is not currently having any symptoms.. Discussed treatment options with including treatment today or waiting until cultures returned. Patient wishes to have treatment today. Patient with no known drug allergies.  Urine pregnancy negative.  UA has small  leukocytes.  At this time, will not treat.  Patient is not symptomatic.  Discussed importance of informing sexual partners and to abstain from sexual intercourse until her partner has completed treatment. Patient had ample opportunity for questions and discussion. All patient's questions were answered with full understanding. Strict return precautions discussed. Patient expresses understanding  and agreement to plan.   Portions of this note were generated with Lobbyist. Dictation errors may occur despite best attempts at proofreading.  Final Clinical Impression(s) / ED Diagnoses Final diagnoses:  Concern about STD in female without diagnosis    Rx / DC Orders ED Discharge Orders         Ordered    doxycycline (VIBRAMYCIN) 100 MG capsule  2 times daily     05/25/19 1439           Desma Mcgregor 05/25/19 1909    Truddie Hidden, MD 05/26/19 [phone]

## 2019-05-25 NOTE — ED Triage Notes (Signed)
Pt states she received a message today that her partner was exposed to an STD and here today to be tested. Pt denies any symptoms.

## 2019-05-25 NOTE — Discharge Instructions (Signed)
You have been treated today for an STD.   The test results with take 2-3 days to return. If there is an abnormal result, you will be notified. If you do not hear anything, that means the results were negative. You can also log on MyChart to see the results.   Your sexual partner needs to be treated too. Do not have sexual intercourse for the next 7 days and after your partner has been treated.   Follow-up with your primary care doctor in 2-4 days. If you do not have a primary care doctor, you can use one listed in the paperwork.   Return to the Emergency Department for any fever, abdominal pain, difficulty breathing, nausea/vomiting or any other worsening or concerning symptoms.

## 2019-05-26 LAB — GC/CHLAMYDIA PROBE AMP (~~LOC~~) NOT AT ARMC
Chlamydia: NEGATIVE
Comment: NEGATIVE
Comment: NORMAL
Neisseria Gonorrhea: NEGATIVE

## 2019-05-26 LAB — HIV ANTIBODY (ROUTINE TESTING W REFLEX)
HIV Screen 4th Generation wRfx: NONREACTIVE
HIV Screen 4th Generation wRfx: NONREACTIVE

## 2019-05-26 LAB — RPR
RPR Ser Ql: NONREACTIVE
RPR Ser Ql: NONREACTIVE

## 2019-06-17 ENCOUNTER — Telehealth: Payer: Self-pay | Admitting: Obstetrics and Gynecology

## 2019-06-17 NOTE — Telephone Encounter (Signed)
Heather Durham referring for patient's wants second opinion for LSIL pap 12/20 with plan to repeat in 1 yr,wants to discuss dysmenorrhea and pelvic pain. Called and spoke with patient about scheduling appointment. Patient is wanting to call back once she has her work schedule so she can schedule to come in.

## 2019-07-07 ENCOUNTER — Ambulatory Visit (INDEPENDENT_AMBULATORY_CARE_PROVIDER_SITE_OTHER): Payer: Medicaid Other | Admitting: Obstetrics and Gynecology

## 2019-07-07 ENCOUNTER — Other Ambulatory Visit: Payer: Self-pay

## 2019-07-07 ENCOUNTER — Encounter: Payer: Self-pay | Admitting: Obstetrics and Gynecology

## 2019-07-07 VITALS — BP 126/74 | Ht 68.0 in | Wt 248.0 lb

## 2019-07-07 DIAGNOSIS — R87612 Low grade squamous intraepithelial lesion on cytologic smear of cervix (LGSIL): Secondary | ICD-10-CM

## 2019-07-07 DIAGNOSIS — N946 Dysmenorrhea, unspecified: Secondary | ICD-10-CM | POA: Diagnosis not present

## 2019-07-07 NOTE — Progress Notes (Signed)
Obstetrics & Gynecology Office Visit   Chief Complaint  Patient presents with   Consult   Abnormal Pap Smear   The patient is seen in referral at the request of [name], MD from [facility] for second opinion regarding abnormal pap smear and for dysmenorrhea.   History of Present Illness: 24 y.o. G32P1001 female who is seen in referral from [name], MD from [facility] for second opinion regarding abnormal pap smear and for dysmenorrhea.   The patient had an LGSIL pap smear in 12/2018 with plans for repeat in 1 year.  She states that she had another pap smear in 2019 that was also abnormal.  She states that she had the same, exact results both times.     She also has bad cramps associated with her menses for which she takes naproxen.  She states that she has bilateral, sharp lower abdominal pain that starts about 4 days prior to her period.  About four days after her period she also gets sharp pains. Her periods come once monthly, every 4 weeks, lasting 5-6 days.  She also has cramping the first three days of her periods.  She states that taking naproxen causes her to feel weird.  She has tried Depo provera and progesterone-only pills, both of which had bad side effects.  She states her periods that are better than prior to her pregnancy.     Past Medical History:  Diagnosis Date   Abnormal Pap smear of cervix    Chlamydia    Dysmenorrhea    Trichomonas infection    UTI (urinary tract infection)     Past Surgical History:  Procedure Laterality Date   FOOT SURGERY Right    Gynecologic History: Patient's last menstrual period was 07/05/2019.  Obstetric History: G1P1001, s/p SVD x 1.    Family History  Problem Relation Age of Onset   Diabetes Cousin    Fibroids Mother     Social History   Socioeconomic History   Marital status: Single    Spouse name: Not on file   Number of children: Not on file   Years of  education: Not on file   Highest education level: Not on file  Occupational History   Not on file  Tobacco Use   Smoking status: Never Smoker   Smokeless tobacco: Never Used  Vaping Use   Vaping Use: Never used  Substance and Sexual Activity   Alcohol use: Yes    Comment: occasional   Drug use: No   Sexual activity: Yes    Birth control/protection: None  Other Topics Concern   Not on file  Social History Narrative   Not on file   Social Determinants of Health   Financial Resource Strain:    Difficulty of Paying Living Expenses:   Food Insecurity:    Worried About Charity fundraiser in the Last Year:    Arboriculturist in the Last Year:   Transportation Needs:    Film/video editor (Medical):    Lack of Transportation (Non-Medical):   Physical Activity:    Days of Exercise per Week:    Minutes of Exercise per Session:   Stress:    Feeling of Stress :   Social Connections:    Frequency of Communication with Friends and Family:    Frequency of Social Gatherings with Friends and Family:    Attends Religious Services:    Active Member of Clubs or  Organizations:    Attends Music therapist:    Marital Status:   Intimate Partner Violence:    Fear of Current or Ex-Partner:    Emotionally Abused:    Physically Abused:    Sexually Abused:     No Known Allergies  Prior to Admission medications   Medication Sig Start Date End Date Taking? Authorizing Provider  naproxen (NAPROSYN) 500 MG tablet Take 500 mg by mouth 2 (two) times daily as needed. 02/01/19  Yes [provider]    Review of Systems  Constitutional: Negative.   HENT: Negative.   Eyes: Negative.   Respiratory: Negative.   Cardiovascular: Negative.   Gastrointestinal: Negative.   Genitourinary: Negative.   Musculoskeletal: Negative.   Skin: Negative.   Neurological: Negative.   Psychiatric/Behavioral: Negative.      Physical Exam BP 126/74    Ht  5\' 8"  (1.727 m)    Wt 248 lb (112.5 kg)    LMP 07/05/2019    BMI 37.71 kg/m  Patient's last menstrual period was 07/05/2019. Physical Exam Constitutional:      General: She is not in acute distress.    Appearance: Normal appearance.  HENT:     Head: Normocephalic and atraumatic.  Eyes:     General: No scleral icterus.    Conjunctiva/sclera: Conjunctivae normal.  Neurological:     General: No focal deficit present.     Mental Status: She is alert and oriented to person, place, and time.     Cranial Nerves: No cranial nerve deficit.  Psychiatric:        Mood and Affect: Mood normal.        Behavior: Behavior normal.        Judgment: Judgment normal.     Female chaperone present for pelvic and breast  portions of the physical exam  Assessment: 24 y.o. G66P1001 female here for  1. LGSIL on Pap smear of cervix   2. Dysmenorrhea      Plan: Problem List Items Addressed This Visit    None    Visit Diagnoses    LGSIL on Pap smear of cervix    -  Primary   Dysmenorrhea         LGSIL pap smear: Patient reports two consecutive abnormal pap smears of LGSIL.  I discussed the recommendations of the ASCCP with her and that her provider is following the correct protocols, which would have her repeating her pap smear after another year (12/2019).  If this pap smear is abnormal, then I would recommend a colposcopy, as per the ASCCP guidelines. I walked her through the guidelines APP from the ASCCP. She seemed to feel comfortable with this explanation.  Dysmenorrhea: discussed the naproxen is likely a good choice for her. She does not seem interested in hormonal medications. We discussed alternatives to naproxen (ibuprofen - she tried and did not like, toradol - she would prefer to stick to naproxen).  We discussed an IUD (Mirena) as a possible alternative to reduce her cramping and discomfort. We talked about the benefits of the IUD and the downsides, such as abnormal bleeding until her body  becomes accustomed to the IUD.  She will consider and literature was provided.    A total of 30 minutes were spent face-to-face with the patient as well as preparation, review, communication, and documentation during this encounter.    Prentice Docker, MD 07/07/2019 4:51 PM    CC: [name], MD [place],  [place] [id number]

## 2019-07-21 ENCOUNTER — Encounter (INDEPENDENT_AMBULATORY_CARE_PROVIDER_SITE_OTHER): Payer: Medicaid Other | Admitting: Vascular Surgery

## 2019-07-28 ENCOUNTER — Encounter (INDEPENDENT_AMBULATORY_CARE_PROVIDER_SITE_OTHER): Payer: Medicaid Other | Admitting: Vascular Surgery

## 2019-08-04 ENCOUNTER — Ambulatory Visit (INDEPENDENT_AMBULATORY_CARE_PROVIDER_SITE_OTHER): Payer: Medicaid Other | Admitting: Vascular Surgery

## 2019-08-04 ENCOUNTER — Encounter (INDEPENDENT_AMBULATORY_CARE_PROVIDER_SITE_OTHER): Payer: Self-pay | Admitting: Vascular Surgery

## 2019-08-04 ENCOUNTER — Other Ambulatory Visit: Payer: Self-pay

## 2019-08-04 DIAGNOSIS — D1800 Hemangioma unspecified site: Secondary | ICD-10-CM | POA: Diagnosis not present

## 2019-08-04 NOTE — Assessment & Plan Note (Signed)
The patient has skin changes in the medial left calf that are not entirely clear in the radiology.  They may just be superficial skin lesions or benign hemangiomas, but particularly with associated leg swelling and pain, I have some concern that there could be associated venous malformations or arteriovenous malformations in the areas.  I would recommend noninvasive duplex be performed to evaluate the major vascular structures in the area.  This may need to be followed by either MRI or CT angiogram if there is concern seen on the duplex.  We will see her back following the duplex to discuss the results and determine further treatment options.

## 2019-08-04 NOTE — Progress Notes (Signed)
Patient ID: Heather Durham, female   DOB: [birth year], 24 y.o.   MRN: [id number]  Chief Complaint  Patient presents with  . New Patient (Initial Visit)    ref Heather Durham hemangioma    HPI Heather Durham is a 24 y.o. female.  I am asked to see the patient by Heather Durham for evaluation of skin changes that have the appearance of hemangiomas on the left medial calf.  The patient has had these since birth and was told that these were just birthmarks.  When she has her menstrual cycles, the areas become very tender to the touch and overtly painful.  They have gradually enlarged over time.  No right leg symptoms.  There is associated left leg swelling as well.  No previous history of DVT or superficial thrombophlebitis to her knowledge.     Past Medical History:  Diagnosis Date  . Abnormal Pap smear of cervix   . Chlamydia   . Dysmenorrhea   . Trichomonas infection   . UTI (urinary tract infection)     Past Surgical History:  Procedure Laterality Date  . FOOT SURGERY Right      Family History  Problem Relation Age of Onset  . Diabetes Cousin   . Fibroids Mother   No bleeding or clotting disorders    Social History   Tobacco Use  . Smoking status: Never Smoker  . Smokeless tobacco: Never Used  Vaping Use  . Vaping Use: Never used  Substance Use Topics  . Alcohol use: Yes    Comment: occasional  . Drug use: No    No Known Allergies  Current Outpatient Medications  Medication Sig Dispense Refill  . naproxen (NAPROSYN) 500 MG tablet Take 500 mg by mouth 2 (two) times daily as needed.    . metroNIDAZOLE (METROGEL) 0.75 % vaginal gel Place 1 Applicatorful vaginally at bedtime. Apply one applicatorful to vagina at bedtime for 5 days (Patient not taking: Reported on 07/07/2019) 70 g 1  . norethindrone (MICRONOR,CAMILA,ERRIN) 0.35 MG tablet Take 1 tablet (0.35 mg total) by mouth daily. (Patient not taking: Reported on 07/07/2019) 3 Package 3   No current  facility-administered medications for this visit.      REVIEW OF SYSTEMS (Negative unless checked)  Constitutional: [] Weight loss  [] Fever  [] Chills Cardiac: [] Chest Durham   [] Chest pressure   [] Palpitations   [] Shortness of breath when laying flat   [] Shortness of breath at rest   [] Shortness of breath with exertion. Vascular:  [] Durham in legs with walking   [] Durham in legs at rest   [] Durham in legs when laying flat   [] Claudication   [] Durham in feet when walking  [] Durham in feet at rest  [] Durham in feet when laying flat   [] History of DVT   [] Phlebitis   [] Swelling in legs   [] Varicose veins   [] Non-healing ulcers Pulmonary:   [] Uses home oxygen   [] Productive cough   [] Hemoptysis   [] Wheeze  [] COPD   [] Asthma Neurologic:  [] Dizziness  [] Blackouts   [] Seizures   [] History of stroke   [] History of TIA  [] Aphasia   [] Temporary blindness   [] Dysphagia   [] Weakness or numbness in arms   [] Weakness or numbness in legs Musculoskeletal:  [] Arthritis   [] Joint swelling   [] Joint Durham   [] Low back Durham Hematologic:  [] Easy bruising  [] Easy bleeding   [] Hypercoagulable state   [] Anemic  [] Hepatitis Gastrointestinal:  [] Blood in stool   [] Vomiting blood  [] Gastroesophageal  reflux/heartburn   [] Abdominal Durham Genitourinary:  [] Chronic kidney disease   [] Difficult urination  [] Frequent urination  [] Burning with urination   [] Hematuria Skin:  [] Rashes   [] Ulcers   [] Wounds Psychological:  [] History of anxiety   []  History of major depression.    Physical Exam BP 119/76 (BP Location: Right Arm)   Pulse 89   Resp 16   Ht 5\' 8"  (1.727 m)   Wt 244 lb (110.7 kg)   LMP 07/05/2019   BMI 37.10 kg/m  Gen:  WD/WN, NAD Head: Heather Durham/AT, No temporalis wasting.  Ear/Nose/Throat: Hearing grossly intact, nares w/o erythema or drainage, oropharynx w/o Erythema/Exudate Eyes: Conjunctiva clear, sclera non-icteric  Neck: trachea midline.  No JVD.  Pulmonary:  Good air movement, respirations not labored, no use of accessory  muscles  Cardiac: RRR, no JVD Vascular:  Vessel Right Left  Radial Palpable Palpable                                   Gastrointestinal:. No masses, surgical incisions, or scars. Musculoskeletal: M/S 5/5 throughout.  Extremities without ischemic changes.  No deformity or atrophy.  Several pink and reddish areas on the medial left calf that have the appearance of hemangiomas or possible vascular malformations.  No significant edema is present today. Neurologic: Sensation grossly intact in extremities.  Symmetrical.  Speech is fluent. Motor exam as listed above. Psychiatric: Judgment intact, Mood & affect appropriate for pt's clinical situation. Dermatologic: No rashes or ulcers noted.  No cellulitis or open wounds.    Radiology No results found.  Labs Recent Results (from the past 2160 hour(s))  Urinalysis, Routine w reflex microscopic- may I&O cath if menses     Status: Abnormal   Collection Time: 05/25/19 11:33 AM  Result Value Ref Range   Color, Urine YELLOW YELLOW   APPearance CLEAR CLEAR   Specific Gravity, Urine 1.020 1.005 - 1.030   pH 5.5 5.0 - 8.0   Glucose, UA NEGATIVE NEGATIVE mg/dL   Hgb urine dipstick NEGATIVE NEGATIVE   Bilirubin Urine NEGATIVE NEGATIVE   Ketones, ur NEGATIVE NEGATIVE mg/dL   Protein, ur NEGATIVE NEGATIVE mg/dL   Nitrite NEGATIVE NEGATIVE   Leukocytes,Ua SMALL (A) NEGATIVE    Comment: Performed at [facility] [address], [place], [place] [address]  Pregnancy, urine     Status: None   Collection Time: 05/25/19 11:33 AM  Result Value Ref Range   Preg Test, Ur NEGATIVE NEGATIVE    Comment:        THE SENSITIVITY OF THIS METHODOLOGY IS >20 mIU/mL. Performed at [facility], [place] [address], [place], [place] [address]   Urinalysis, Microscopic (reflex)     Status: Abnormal   Collection Time: 05/25/19 11:33 AM  Result Value Ref Range   RBC / HPF NONE SEEN 0 - 5 RBC/hpf   WBC, UA 0-5 0 - 5 WBC/hpf   Bacteria, UA MANY (A)  NONE SEEN   Squamous Epithelial / LPF 6-10 0 - 5    Comment: Performed at [facility], [place] [address] [place], [place] [address]  GC/Chlamydia probe amp [facility]) not at [facility]     Status: None   Collection Time: 05/25/19  1:28 PM  Result Value Ref Range   Neisseria Gonorrhea Negative    Chlamydia Negative    Comment Normal Reference [place] Chlamydia - Negative    Comment  Normal Reference Range Neisseria Gonorrhea - Negative  RPR     Status: None   Collection Time: 05/25/19  1:55 PM  Result Value Ref Range   RPR Ser Ql NON REACTIVE NON REACTIVE    Comment: Performed at [facility] [address], [place], [place] [address]  HIV Antibody (routine testing w rflx)     Status: None   Collection Time: 05/25/19  1:55 PM  Result Value Ref Range   HIV Screen 4th Generation wRfx Non Reactive Non Reactive    Comment: (NOTE) Performed At: [facility] [place], [place] [id number] Rush Farmer MD [id number]   RPR     Status: None   Collection Time: 05/25/19  3:10 PM  Result Value Ref Range   RPR Ser Ql NON REACTIVE NON REACTIVE    Comment: Performed at [facility], [address] [place], [place] [address]  HIV Antibody (routine testing w rflx)     Status: None   Collection Time: 05/25/19  3:10 PM  Result Value Ref Range   HIV Screen 4th Generation wRfx Non Reactive Non Reactive    Comment: (NOTE) Performed At: [facility] [place], [place] [id number] Rush Farmer MD [id number]   Wet prep, genital     Status: Abnormal   Collection Time: 05/25/19  3:25 PM  Result Value Ref Range   Yeast Wet Prep HPF POC NONE SEEN NONE SEEN   Trich, Wet Prep NONE SEEN NONE SEEN   Clue Cells Wet Prep HPF POC PRESENT (A) NONE SEEN   WBC, Wet Prep HPF POC MODERATE (A) NONE SEEN   Sperm NONE SEEN     Comment: Performed at [facility], [place] [address] [place],  [address]    Assessment/Plan:  Multiple  hemangiomas The patient has skin changes in the medial left calf that are not entirely clear in the radiology.  They may just be superficial skin lesions or benign hemangiomas, but particularly with associated leg swelling and Durham, I have some concern that there could be associated venous malformations or arteriovenous malformations in the areas.  I would recommend noninvasive duplex be performed to evaluate the major vascular structures in the area.  This may need to be followed by either MRI or CT angiogram if there is concern seen on the duplex.  We will see her back following the duplex to discuss the results and determine further treatment options.      Heather Durham 08/04/2019, 4:22 PM   This note was created with Dragon medical transcription system.  Any errors from dictation are unintentional.

## 2019-08-25 ENCOUNTER — Other Ambulatory Visit (HOSPITAL_COMMUNITY)
Admission: RE | Admit: 2019-08-25 | Discharge: 2019-08-25 | Disposition: A | Discharge status: Home / Self Care | Payer: Medicaid Other | Source: Ambulatory Visit | Attending: Obstetrics and Gynecology | Admitting: Obstetrics and Gynecology

## 2019-08-25 ENCOUNTER — Other Ambulatory Visit: Payer: Self-pay

## 2019-08-25 ENCOUNTER — Ambulatory Visit (INDEPENDENT_AMBULATORY_CARE_PROVIDER_SITE_OTHER): Payer: Medicaid Other | Admitting: Obstetrics and Gynecology

## 2019-08-25 ENCOUNTER — Encounter: Payer: Self-pay | Admitting: Obstetrics and Gynecology

## 2019-08-25 VITALS — BP 122/74 | Ht 68.0 in | Wt 245.0 lb

## 2019-08-25 DIAGNOSIS — N921 Excessive and frequent menstruation with irregular cycle: Secondary | ICD-10-CM | POA: Diagnosis not present

## 2019-08-25 DIAGNOSIS — A549 Gonococcal infection, unspecified: Secondary | ICD-10-CM

## 2019-08-25 DIAGNOSIS — Z113 Encounter for screening for infections with a predominantly sexual mode of transmission: Secondary | ICD-10-CM

## 2019-08-25 LAB — POCT URINE PREGNANCY: Preg Test, Ur: NEGATIVE

## 2019-08-25 NOTE — Patient Instructions (Signed)
I value your feedback and entrusting us with your care. If you get a [facility] patient survey, I would appreciate you taking the time to let us know about your experience today. Thank you!  As of December 25, 2018, your lab results will be released to your MyChart immediately, before I even have a chance to see them. Please give me time to review them and contact you if there are any abnormalities. Thank you for your patience.  

## 2019-08-25 NOTE — Progress Notes (Signed)
Heather Coffin, MD   Chief Complaint  Patient presents with  . Menstrual Problem  . Pelvic Pain    HPI:      [name] is a 24 y.o. G1P1001 whose LMP was Patient's last menstrual period was 08/03/2019., presents today for BTB with this period. Menses usually monthly, lasting 5 days, mod flow, no BTB, mod dysmen, improved with anaprox. LMP WNL but then started spotting with light red bleeding last wk. Had neg UPT No pelvic pain, fevers. Menses due end of this wk. Neg STD testing 5/21. She is sex active, using condoms. Had failed condom last wk. Didn't take Plan B. No vag sx, urin sx.  Hx of LGSIL pap, due for repeat next yr.    Past Medical History:  Diagnosis Date  . Abnormal Pap smear of cervix   . Chlamydia   . Dysmenorrhea   . Trichomonas infection   . UTI (urinary tract infection)     Past Surgical History:  Procedure Laterality Date  . FOOT SURGERY Right     Family History  Problem Relation Age of Onset  . Diabetes Cousin   . Fibroids Mother     Social History   Socioeconomic History  . Marital status: Single    Spouse name: Not on file  . Number of children: Not on file  . Years of education: Not on file  . Highest education level: Not on file  Occupational History  . Not on file  Tobacco Use  . Smoking status: Never Smoker  . Smokeless tobacco: Never Used  Vaping Use  . Vaping Use: Never used  Substance and Sexual Activity  . Alcohol use: Yes    Comment: occasional  . Drug use: No  . Sexual activity: Yes    Birth control/protection: None  Other Topics Concern  . Not on file  Social History Narrative  . Not on file   Social Determinants of Health   Financial Resource Strain:   . Difficulty of Paying Living Expenses:   Food Insecurity:   . Worried About Charity fundraiser in the Last Year:   . Arboriculturist in the Last Year:   Transportation Needs:   . Film/video editor (Medical):   Marland Kitchen Lack of Transportation  (Non-Medical):   Physical Activity:   . Days of Exercise per Week:   . Minutes of Exercise per Session:   Stress:   . Feeling of Stress :   Social Connections:   . Frequency of Communication with Friends and Family:   . Frequency of Social Gatherings with Friends and Family:   . Attends Religious Services:   . Active Member of Clubs or Organizations:   . Attends Archivist Meetings:   Marland Kitchen Marital Status:   Intimate Partner Violence:   . Fear of Current or Ex-Partner:   . Emotionally Abused:   Marland Kitchen Physically Abused:   . Sexually Abused:     Outpatient Medications Prior to Visit  Medication Sig Dispense Refill  . naproxen (NAPROSYN) 500 MG tablet Take 500 mg by mouth 2 (two) times daily as needed.    . metroNIDAZOLE (METROGEL) 0.75 % vaginal gel Place 1 Applicatorful vaginally at bedtime. Apply one applicatorful to vagina at bedtime for 5 days (Patient not taking: Reported on 07/07/2019) 70 g 1  . norethindrone (MICRONOR,CAMILA,ERRIN) 0.35 MG tablet Take 1 tablet (0.35 mg total) by mouth daily. (Patient not taking: Reported on 07/07/2019) 3 Package 3  No facility-administered medications prior to visit.      ROS:  Review of Systems  Constitutional: Negative for fever.  Gastrointestinal: Negative for blood in stool, constipation, diarrhea, nausea and vomiting.  Genitourinary: Positive for vaginal bleeding. Negative for dyspareunia, dysuria, flank pain, frequency, hematuria, urgency, vaginal discharge and vaginal pain.  Musculoskeletal: Negative for back pain.  Skin: Negative for rash.   BREAST: No symptoms   OBJECTIVE:   Vitals:  BP 122/74   Ht 5\' 8"  (1.727 m)   Wt 245 lb (111.1 kg)   LMP 08/03/2019   BMI 37.25 kg/m   Physical Exam Vitals reviewed.  Constitutional:      Appearance: She is well-developed.  Pulmonary:     Effort: Pulmonary effort is normal.  Genitourinary:    General: Normal vulva.     Pubic Area: No rash.      Labia:        Right: No  rash, tenderness or lesion.        Left: No rash, tenderness or lesion.      Vagina: Vaginal discharge present. No erythema, tenderness or bleeding.     Cervix: Normal.     Uterus: Normal. Not enlarged and not tender.      Adnexa: Right adnexa normal and left adnexa normal.       Right: No mass or tenderness.         Left: No mass or tenderness.    Musculoskeletal:        General: Normal range of motion.     Cervical back: Normal range of motion.  Skin:    General: Skin is warm and dry.  Neurological:     General: No focal deficit present.     Mental Status: She is alert and oriented to person, place, and time.  Psychiatric:        Mood and Affect: Mood normal.        Behavior: Behavior normal.        Thought Content: Thought content normal.        Judgment: Judgment normal.     Results: Results for orders placed or performed in visit on 08/25/19 (from the past 24 hour(s))  POCT urine pregnancy     Status: Normal   Collection Time: 08/25/19  3:46 PM  Result Value Ref Range   Preg Test, Ur Negative Negative     Assessment/Plan: Breakthrough bleeding - Plan: POCT urine pregnancy, This cycle only. Neg UPT. Rule out STDs. If neg, reassurance and follow next cycle. Will check labs/u/s if sx persist.   Screening for STD (sexually transmitted disease) - Plan: Cervicovaginal ancillary only     Return if symptoms worsen or fail to improve.  Aspyn Warnke B. Polo Mcmartin, PA-C 08/25/2019 3:47 PM

## 2019-08-27 DIAGNOSIS — A549 Gonococcal infection, unspecified: Secondary | ICD-10-CM | POA: Insufficient documentation

## 2019-08-27 LAB — CERVICOVAGINAL ANCILLARY ONLY
Chlamydia: NEGATIVE
Comment: NEGATIVE
Comment: NORMAL
Neisseria Gonorrhea: POSITIVE — AB

## 2019-08-27 MED ORDER — CEFTRIAXONE SODIUM 250 MG IJ SOLR
250.0000 mg | Freq: Once | INTRAMUSCULAR | Status: AC
  Administered 2019-09-01: 250 mg via INTRAMUSCULAR

## 2019-08-27 NOTE — Progress Notes (Signed)
DONE

## 2019-08-27 NOTE — Addendum Note (Signed)
Addended by: Ardeth Perfect B on: 08/03/9104 11:41 AM   Modules accepted: Orders

## 2019-09-01 ENCOUNTER — Ambulatory Visit (INDEPENDENT_AMBULATORY_CARE_PROVIDER_SITE_OTHER): Payer: Medicaid Other

## 2019-09-01 ENCOUNTER — Ambulatory Visit (INDEPENDENT_AMBULATORY_CARE_PROVIDER_SITE_OTHER): Payer: Medicaid Other | Admitting: Vascular Surgery

## 2019-09-01 ENCOUNTER — Other Ambulatory Visit: Payer: Self-pay

## 2019-09-01 ENCOUNTER — Encounter (INDEPENDENT_AMBULATORY_CARE_PROVIDER_SITE_OTHER): Payer: Self-pay | Admitting: Vascular Surgery

## 2019-09-01 VITALS — BP 111/74 | HR 76 | Ht 68.0 in | Wt 247.0 lb

## 2019-09-01 DIAGNOSIS — A549 Gonococcal infection, unspecified: Secondary | ICD-10-CM

## 2019-09-01 DIAGNOSIS — D1800 Hemangioma unspecified site: Secondary | ICD-10-CM

## 2019-09-01 NOTE — Progress Notes (Signed)
MRN : [id number]  Heather Durham is a 24 y.o. (10/05/1995) female who presents with chief complaint of  Chief Complaint  Patient presents with  . Follow-up    pt conv LLE U/S ( look for Malformation)  .  History of Present Illness: Patient returns today in follow up of her skin changes on the left medial calf.  There have been no changes since her last visit about a month ago.  No new symptoms. Duplex today shows normal arterial flow in the left lower extremity with no occlusive disease no abnormalities identified in the area of redness and skin changes.  No arteriovenous malformations or venous malformations were noted.   Current Outpatient Medications  Medication Sig Dispense Refill  . naproxen (NAPROSYN) 500 MG tablet Take 500 mg by mouth 2 (two) times daily as needed.    . metroNIDAZOLE (METROGEL) 0.75 % vaginal gel Place 1 Applicatorful vaginally at bedtime. Apply one applicatorful to vagina at bedtime for 5 days (Patient not taking: Reported on 07/07/2019) 70 g 1  . norethindrone (MICRONOR,CAMILA,ERRIN) 0.35 MG tablet Take 1 tablet (0.35 mg total) by mouth daily. (Patient not taking: Reported on 07/07/2019) 3 Package 3   No current facility-administered medications for this visit.    Past Medical History:  Diagnosis Date  . Abnormal Pap smear of cervix   . Chlamydia   . Dysmenorrhea   . Trichomonas infection   . UTI (urinary tract infection)     Past Surgical History:  Procedure Laterality Date  . FOOT SURGERY Right      Social History   Tobacco Use  . Smoking status: Never Smoker  . Smokeless tobacco: Never Used  Vaping Use  . Vaping Use: Never used  Substance Use Topics  . Alcohol use: Yes    Comment: occasional  . Drug use: No      Family History  Problem Relation Age of Onset  . Diabetes Cousin   . Fibroids Mother     No Known Allergies   REVIEW OF SYSTEMS (Negative unless checked)  Constitutional: [] Weight loss  [] Fever  [] Chills Cardiac:  [] Chest pain   [] Chest pressure   [] Palpitations   [] Shortness of breath when laying flat   [] Shortness of breath at rest   [] Shortness of breath with exertion. Vascular:  [] Pain in legs with walking   [] Pain in legs at rest   [] Pain in legs when laying flat   [] Claudication   [] Pain in feet when walking  [] Pain in feet at rest  [] Pain in feet when laying flat   [] History of DVT   [] Phlebitis   [] Swelling in legs   [] Varicose veins   [] Non-healing ulcers Pulmonary:   [] Uses home oxygen   [] Productive cough   [] Hemoptysis   [] Wheeze  [] COPD   [] Asthma Neurologic:  [] Dizziness  [] Blackouts   [] Seizures   [] History of stroke   [] History of TIA  [] Aphasia   [] Temporary blindness   [] Dysphagia   [] Weakness or numbness in arms   [] Weakness or numbness in legs Musculoskeletal:  [] Arthritis   [] Joint swelling   [] Joint pain   [] Low back pain Hematologic:  [] Easy bruising  [] Easy bleeding   [] Hypercoagulable state   [] Anemic   Gastrointestinal:  [] Blood in stool   [] Vomiting blood  [] Gastroesophageal reflux/heartburn   [] Abdominal pain Genitourinary:  [] Chronic kidney disease   [] Difficult urination  [] Frequent urination  [] Burning with urination   [] Hematuria Skin:  [] Rashes   [] Ulcers   [] Wounds Psychological:  []   History of anxiety   []  History of major depression.  Physical Examination  BP 111/74   Pulse 76   Ht 5\' 8"  (1.727 m)   Wt 247 lb (112 kg)   LMP 08/03/2019   BMI 37.56 kg/m  Gen:  WD/WN, NAD Head: [place]/AT, No temporalis wasting. Ear/Nose/Throat: Hearing grossly intact, nares w/o erythema or drainage Eyes: Conjunctiva clear. Sclera non-icteric Neck: Supple.  Trachea midline Pulmonary:  Good air movement, no use of accessory muscles.  Cardiac: RRR, no JVD Vascular:  Vessel Right Left  Radial Palpable Palpable                   Musculoskeletal: M/S 5/5 throughout.  No deformity or atrophy.  Several reddish and pink areas on the medial left calf.  No significant edema. Neurologic:  Sensation grossly intact in extremities.  Symmetrical.  Speech is fluent.  Psychiatric: Judgment intact, Mood & affect appropriate for pt's clinical situation. Dermatologic: No rashes or ulcers noted.  No cellulitis or open wounds.       Labs Recent Results (from the past 2160 hour(s))  Cervicovaginal ancillary only     Status: Abnormal   Collection Time: 08/25/19  3:18 PM  Result Value Ref Range   Neisseria Gonorrhea Positive (A)    Chlamydia Negative    Comment Normal Reference [place] Chlamydia - Negative    Comment      Normal Reference Range Neisseria Gonorrhea - Negative  POCT urine pregnancy     Status: Normal   Collection Time: 08/25/19  3:46 PM  Result Value Ref Range   Preg Test, Ur Negative Negative    Radiology No results found.  Assessment/Plan  Multiple hemangiomas Duplex today shows normal arterial flow in the left lower extremity with no occlusive disease no abnormalities identified in the area of redness and skin changes.  No arteriovenous malformations or venous malformations were noted.  These are likely then just benign findings of the skin.  I would not recommend any treatment at current as they are not terribly bothersome to her.  She will return as needed.    Leotis Pain, MD  09/01/2019 4:23 PM    This note was created with Dragon medical transcription system.  Any errors from dictation are purely unintentional

## 2019-09-01 NOTE — Assessment & Plan Note (Signed)
Duplex today shows normal arterial flow in the left lower extremity with no occlusive disease no abnormalities identified in the area of redness and skin changes.  No arteriovenous malformations or venous malformations were noted.  These are likely then just benign findings of the skin.  I would not recommend any treatment at current as they are not terribly bothersome to her.  She will return as needed.

## 2019-09-01 NOTE — Patient Instructions (Signed)
Pt here for Rocephin 250mg  per [facility]t Copland order. Given RUOQ. Pt tolerated well

## 2020-01-09 ENCOUNTER — Encounter (HOSPITAL_COMMUNITY): Payer: Self-pay

## 2020-01-09 ENCOUNTER — Emergency Department (HOSPITAL_COMMUNITY): Payer: Medicaid Other

## 2020-01-09 ENCOUNTER — Emergency Department (HOSPITAL_COMMUNITY)
Admission: EM | Admit: 2020-01-09 | Discharge: 2020-01-09 | Disposition: A | Discharge status: Home / Self Care | Payer: Medicaid Other | Attending: Emergency Medicine | Admitting: Emergency Medicine

## 2020-01-09 ENCOUNTER — Other Ambulatory Visit: Payer: Self-pay

## 2020-01-09 DIAGNOSIS — Z23 Encounter for immunization: Secondary | ICD-10-CM | POA: Insufficient documentation

## 2020-01-09 DIAGNOSIS — W450XXA Nail entering through skin, initial encounter: Secondary | ICD-10-CM | POA: Insufficient documentation

## 2020-01-09 DIAGNOSIS — S99922A Unspecified injury of left foot, initial encounter: Secondary | ICD-10-CM | POA: Diagnosis present

## 2020-01-09 DIAGNOSIS — Y9301 Activity, walking, marching and hiking: Secondary | ICD-10-CM | POA: Diagnosis not present

## 2020-01-09 DIAGNOSIS — S91332A Puncture wound without foreign body, left foot, initial encounter: Secondary | ICD-10-CM | POA: Insufficient documentation

## 2020-01-09 MED ORDER — CEPHALEXIN 500 MG PO CAPS
500.0000 mg | ORAL_CAPSULE | Freq: Four times a day (QID) | ORAL | 0 refills | Status: AC
Start: 2020-01-09 — End: 2020-01-16

## 2020-01-09 MED ORDER — TETANUS-DIPHTH-ACELL PERTUSSIS 5-2.5-18.5 LF-MCG/0.5 IM SUSY
0.5000 mL | PREFILLED_SYRINGE | Freq: Once | INTRAMUSCULAR | Status: AC
  Administered 2020-01-09: 0.5 mL via INTRAMUSCULAR
  Filled 2020-01-09: qty 0.5

## 2020-01-09 MED ORDER — CEPHALEXIN 500 MG PO CAPS
500.0000 mg | ORAL_CAPSULE | Freq: Four times a day (QID) | ORAL | 0 refills | Status: DC
Start: 2020-01-09 — End: 2020-01-09

## 2020-01-09 MED ORDER — ACETAMINOPHEN 325 MG PO TABS
650.0000 mg | ORAL_TABLET | Freq: Once | ORAL | Status: AC
  Administered 2020-01-09: 650 mg via ORAL
  Filled 2020-01-09: qty 2

## 2020-01-09 NOTE — Discharge Instructions (Addendum)
Like we discussed, I am prescribing you an antibiotic called Keflex.  You are going to take this 4 times a day for the next 7 days.  Do not stop taking early.  I recommend a combination of tylenol and ibuprofen for management of your pain. You can take a low dose of both at the same time. I recommend 325 mg of Tylenol combined with 400 mg of ibuprofen. This is one regular Tylenol and two regular ibuprofen. You can take these 2-3 times for day for your pain. Please try to take these medications with a small amount of food as well to prevent upsetting your stomach.  Please return to the emergency department if you develop any signs or symptoms of infection.  It was a pleasure to meet you.

## 2020-01-09 NOTE — ED Provider Notes (Signed)
[facility] Provider Note   CSN: [id number] Arrival date & time: 01/09/20  0104     History Chief Complaint  Patient presents with  . Foot Injury    Heather Durham is a 24 y.o. female.  HPI Patient is a 24 year old female with a medical history as noted below.  She states about 12 hours ago she was walking her dog and stepped on a rusty screw.  This punctured the plantar aspect of her left foot.  She reports mild bleeding at the site which is since resolved.  Patient notes moderate pain in the region that worsens with bearing weight.  Has not taken anything for symptoms.  Unsure of the timing of her last Tdap.  No numbness or weakness.    Past Medical History:  Diagnosis Date  . Abnormal Pap smear of cervix   . Chlamydia   . Dysmenorrhea   . Trichomonas infection   . UTI (urinary tract infection)     Patient Active Problem List   Diagnosis Date Noted  . Gonorrhea 08/27/2019  . 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 FOOT SURGERY Right      OB History    Gravida  1   Para  1   Term  1   Preterm  0   AB  0   Living  1     SAB  0   IAB  0   Ectopic  0   Multiple  0   Live Births  1           Family History  Problem Relation Age of Onset  . Diabetes Cousin   . Fibroids Mother     Social History   Tobacco Use  . Smoking status: Never Smoker  . Smokeless tobacco: Never Used  Vaping Use  . Vaping Use: Never used  Substance Use Topics  . Alcohol use: Yes    Comment: occasional  . Drug use: No    Home Medications Prior to Admission medications   Medication Sig Start Date End Date Taking? Authorizing Provider  metroNIDAZOLE (METROGEL) 0.75 % vaginal gel Place 1 Applicatorful vaginally at bedtime. Apply one applicatorful to vagina at bedtime for 5 days Patient not taking: Reported on 07/07/2019 06/27/17   Donnamae Jude,  MD  naproxen (NAPROSYN) 500 MG tablet Take 500 mg by mouth 2 (two) times daily as needed. 02/01/19   [provider]  norethindrone (MICRONOR,CAMILA,ERRIN) 0.35 MG tablet Take 1 tablet (0.35 mg total) by mouth daily. Patient not taking: Reported on 07/07/2019 06/24/17   Donnamae Jude, MD    Allergies    Patient has no known allergies.  Review of Systems   Review of Systems  Musculoskeletal: Positive for myalgias.  Skin: Positive for wound.  Neurological: Negative for weakness and numbness.   Physical Exam Updated Vital Signs BP 129/78 (BP Location: Right Arm)   Pulse 87   Temp 98.1 F (36.7 C) (Oral)   Resp 16   Ht 5' 8.5" (1.74 m)   Wt 113.4 kg   LMP 01/09/2020   SpO2 100%   BMI 37.46 kg/m   Physical Exam Vitals and nursing note reviewed.  Constitutional:      General: She is not in acute distress.    Appearance: She is well-developed.  HENT:     Head: Normocephalic and atraumatic.     Right  Ear: External ear normal.     Left Ear: External ear normal.  Eyes:     General: No scleral icterus.       Right eye: No discharge.        Left eye: No discharge.     Conjunctiva/sclera: Conjunctivae normal.  Neck:     Trachea: No tracheal deviation.  Cardiovascular:     Rate and Rhythm: Normal rate.  Pulmonary:     Effort: Pulmonary effort is normal. No respiratory distress.     Breath sounds: No stridor.  Abdominal:     General: There is no distension.  Musculoskeletal:        General: Tenderness present. No swelling or deformity.     Cervical back: Neck supple.     Comments: Small healing puncture wound noted to the plantar surface of the left foot.  No surrounding erythema.  No cellulitis.  Mild tenderness noted overlying the site.  No discharge or bleeding.  Distal sensation intact.  Good cap refill.  Palpable pedal pulses.  Skin:    General: Skin is warm and dry.     Findings: No rash.  Neurological:     Mental Status: She is alert.     Cranial Nerves:  Cranial nerve deficit: no gross deficits.    ED Results / Procedures / Treatments   Labs (all labs ordered are listed, but only abnormal results are displayed) Labs Reviewed - No data to display  EKG None  Radiology DG Foot Complete Left  Result Date: 01/09/2020 CLINICAL DATA:  Stepped on screw, evaluate for foreign body EXAM: LEFT FOOT - COMPLETE 3+ VIEW COMPARISON:  Radiograph 11/06/2010 FINDINGS: Reported site of puncture wound is not well visualized radiographically. No visible retained foreign bodies. No soft tissue gas or significant swelling. No acute bony abnormality. Specifically, no fracture, osseous defect, subluxation, or dislocation within the limitations of a nonweightbearing exam. IMPRESSION: No visible retained foreign bodies.  No acute osseous abnormality. Electronically Signed   By: Lovena Le M.D.   On: 01/09/2020 01:50    Procedures Procedures (including critical care time)  Medications Ordered in ED Medications  acetaminophen (TYLENOL) tablet 650 mg (has no administration in time range)  Tdap (BOOSTRIX) injection 0.5 mL (has no administration in time range)   ED Course  I have reviewed the triage vital signs and the nursing notes.  Pertinent labs & imaging results that were available during my care of the patient were reviewed by me and considered in my medical decision making (see chart for details).    MDM Rules/Calculators/A&P                          Patient is a 24 year old female who presents to the emergency department due to injury of the left foot.  She was walking her dog and stepped on a rusty screw.  Wound seems very superficial.  No bleeding.  No surrounding erythema.  No signs of cellulitis.  Neurovascularly intact in the foot.  No red flags.  X-ray obtained showing no visible retained foreign bodies.  No acute osseous abnormalities.  These were personally reviewed and interpreted.  Feel that her risk of infection is low but will discharge on a  short course of Keflex.  Tdap updated in the ED.  Return to the ER with any new or worsening symptoms.  Her questions were answered and she was amicable at the time of discharge.  Final Clinical Impression(s) / ED  Diagnoses Final diagnoses:  Injury of left foot, initial encounter   Rx / DC Orders ED Discharge Orders         Ordered    cephALEXin (KEFLEX) 500 MG capsule  4 times daily        01/09/20 0210           Rayna Sexton, PA-C [id number]    Delora Fuel, MD [id number] [phone]

## 2020-01-09 NOTE — ED Triage Notes (Signed)
Patient coming from home, reports she stepped on a nail with her L foot around noon, states it is no longer in her foot, unknown last tetanus.

## 2020-05-21 ENCOUNTER — Emergency Department (HOSPITAL_COMMUNITY)
Admission: EM | Admit: 2020-05-21 | Discharge: 2020-05-21 | Disposition: A | Discharge status: Home / Self Care | Payer: Medicaid Other | Attending: Emergency Medicine | Admitting: Emergency Medicine

## 2020-05-21 DIAGNOSIS — Z28310 Unvaccinated for covid-19: Secondary | ICD-10-CM | POA: Insufficient documentation

## 2020-05-21 DIAGNOSIS — R000 Tachycardia, unspecified: Secondary | ICD-10-CM | POA: Diagnosis not present

## 2020-05-21 DIAGNOSIS — U071 COVID-19: Secondary | ICD-10-CM | POA: Insufficient documentation

## 2020-05-21 DIAGNOSIS — R079 Chest pain, unspecified: Secondary | ICD-10-CM | POA: Diagnosis present

## 2020-05-21 LAB — CBC WITH DIFFERENTIAL/PLATELET
Abs Immature Granulocytes: 0.03 10*3/uL (ref 0.00–0.07)
Basophils Absolute: 0 10*3/uL (ref 0.0–0.1)
Basophils Relative: 0 %
Eosinophils Absolute: 0 10*3/uL (ref 0.0–0.5)
Eosinophils Relative: 1 %
HCT: 35.6 % — ABNORMAL LOW (ref 36.0–46.0)
Hemoglobin: 11.4 g/dL — ABNORMAL LOW (ref 12.0–15.0)
Immature Granulocytes: 1 %
Lymphocytes Relative: 9 %
Lymphs Abs: 0.5 10*3/uL — ABNORMAL LOW (ref 0.7–4.0)
MCH: 29.5 pg (ref 26.0–34.0)
MCHC: 32 g/dL (ref 30.0–36.0)
MCV: 92 fL (ref 80.0–100.0)
Monocytes Absolute: 0.7 10*3/uL (ref 0.1–1.0)
Monocytes Relative: 12 %
Neutro Abs: 4.4 10*3/uL (ref 1.7–7.7)
Neutrophils Relative %: 77 %
Platelets: 279 10*3/uL (ref 150–400)
RBC: 3.87 MIL/uL (ref 3.87–5.11)
RDW: 12.7 % (ref 11.5–15.5)
WBC: 5.6 10*3/uL (ref 4.0–10.5)
nRBC: 0 % (ref 0.0–0.2)

## 2020-05-21 LAB — COMPREHENSIVE METABOLIC PANEL
ALT: 12 U/L (ref 0–44)
AST: 15 U/L (ref 15–41)
Albumin: 3.5 g/dL (ref 3.5–5.0)
Alkaline Phosphatase: 56 U/L (ref 38–126)
Anion gap: 6 (ref 5–15)
BUN: 8 mg/dL (ref 6–20)
CO2: 23 mmol/L (ref 22–32)
Calcium: 9.2 mg/dL (ref 8.9–10.3)
Chloride: 105 mmol/L (ref 98–111)
Creatinine, Ser: 0.84 mg/dL (ref 0.44–1.00)
GFR, Estimated: >60 mL/min (ref >60)
Glucose, Bld: 102 mg/dL — ABNORMAL HIGH (ref 70–99)
Potassium: 3.5 mmol/L (ref 3.5–5.1)
Sodium: 134 mmol/L — ABNORMAL LOW (ref 135–145)
Total Bilirubin: 0.4 mg/dL (ref 0.3–1.2)
Total Protein: 7.4 g/dL (ref 6.5–8.1)

## 2020-05-21 LAB — URINALYSIS, ROUTINE W REFLEX MICROSCOPIC
Bilirubin Urine: NEGATIVE
Glucose, UA: NEGATIVE mg/dL
Ketones, ur: NEGATIVE mg/dL
Nitrite: NEGATIVE
Protein, ur: NEGATIVE mg/dL
Specific Gravity, Urine: 1.012 (ref 1.005–1.030)
pH: 6 (ref 5.0–8.0)

## 2020-05-21 LAB — I-STAT BETA HCG BLOOD, ED (MC, WL, AP ONLY): I-stat hCG, quantitative: <5 m[IU]/mL (ref <5)

## 2020-05-21 LAB — POC SARS CORONAVIRUS 2 AG -  ED: SARSCOV2ONAVIRUS 2 AG: POSITIVE — AB

## 2020-05-21 LAB — LACTIC ACID, PLASMA: Lactic Acid, Venous: 1.3 mmol/L (ref 0.5–1.9)

## 2020-05-21 MED ORDER — ONDANSETRON 4 MG PO TBDP: 4.0000 mg | ORAL_TABLET | Freq: Three times a day (TID) | ORAL | 0 refills | Status: DC | PRN

## 2020-05-21 MED ORDER — ONDANSETRON HCL 4 MG/2ML IJ SOLN
4.0000 mg | Freq: Once | INTRAMUSCULAR | Status: AC
  Administered 2020-05-21: 4 mg via INTRAVENOUS
  Filled 2020-05-21: qty 2

## 2020-05-21 MED ORDER — ACETAMINOPHEN 500 MG PO TABS
1000.0000 mg | ORAL_TABLET | Freq: Once | ORAL | Status: AC
  Administered 2020-05-21: 1000 mg via ORAL
  Filled 2020-05-21: qty 2

## 2020-05-21 MED ORDER — SODIUM CHLORIDE 0.9 % IV BOLUS
1000.0000 mL | Freq: Once | INTRAVENOUS | Status: AC
  Administered 2020-05-21: 1000 mL via INTRAVENOUS

## 2020-05-21 MED ORDER — KETOROLAC TROMETHAMINE 30 MG/ML IJ SOLN
15.0000 mg | Freq: Once | INTRAMUSCULAR | Status: AC
  Administered 2020-05-21: 15 mg via INTRAVENOUS
  Filled 2020-05-21: qty 1

## 2020-05-21 NOTE — ED Triage Notes (Signed)
Pt arrived POV from home d/t waking up this morning with lower back pain radiating to legs bilaterally.  Pt. Complains of HA, CP and nausea with 1 episode of vomiting about an hr ago.  Pt is currently febrile at 103 and tachy at 134.  PT has had no Covid vaccines.

## 2020-05-21 NOTE — Discharge Instructions (Signed)
Symptoms from [place] can last several days to weeks.  Please stay well-hydrated, use Tylenol and ibuprofen to control your discomfort.  In addition, Zofran has been prescribed.  This will help with nausea.  Follow-up with your physician or return here for concerning changes in your condition.

## 2020-05-21 NOTE — ED Provider Notes (Signed)
[facility] EMERGENCY DEPARTMENT Provider Note   CSN: [id number] Arrival date & time: 05/21/20  1726     History No chief complaint on file.   Heather Durham is a 25 y.o. female.  HPI    Patient was previously well presents with 1 day of chest pain, back pain, headache, vomiting. She was well when she went to bed last night, but awoke this morning with headache.  Soon thereafter she developed chest pain, back pain, soreness.  She has subsequently developed anorexia, nausea, and vomited about 1 hour prior to my evaluation.  No abdominal pain, no known fever, no diarrhea. No relief with anything. No known sick contacts. Patient did not receive COVID vaccines. Past Medical History:  Diagnosis Date  . Abnormal Pap smear of cervix   . Chlamydia   . Dysmenorrhea   . Trichomonas infection   . UTI (urinary tract infection)     Patient Active Problem List   Diagnosis Date Noted  . Gonorrhea 08/27/2019  . 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 FOOT SURGERY Right      OB History    Gravida  1   Para  1   Term  1   Preterm  0   AB  0   Living  1     SAB  0   IAB  0   Ectopic  0   Multiple  0   Live Births  1           Family History  Problem Relation Age of Onset  . Diabetes Cousin   . Fibroids Mother     Social History   Tobacco Use  . Smoking status: Never Smoker  . Smokeless tobacco: Never Used  Vaping Use  . Vaping Use: Never used  Substance Use Topics  . Alcohol use: Yes    Comment: occasional  . Drug use: No    Home Medications Prior to Admission medications   Medication Sig Start Date End Date Taking? Authorizing Provider  naproxen (NAPROSYN) 500 MG tablet Take 500 mg by mouth 2 (two) times daily as needed for mild pain. 02/01/19  Yes [provider]  norethindrone (MICRONOR,CAMILA,ERRIN) 0.35 MG tablet Take 1 tablet (0.35  mg total) by mouth daily. Patient not taking: Reported on 07/07/2019 06/24/17 01/09/20  Donnamae Jude, MD    Allergies    Patient has no known allergies.  Review of Systems   Review of Systems  Constitutional:       Per HPI, otherwise negative  HENT:       Per HPI, otherwise negative  Respiratory:       Per HPI, otherwise negative  Cardiovascular:       Per HPI, otherwise negative  Gastrointestinal: Positive for nausea and vomiting. Negative for abdominal pain.  Endocrine:       Negative aside from HPI  Genitourinary:       Neg aside from HPI   Musculoskeletal:       Per HPI, otherwise negative  Skin: Negative.   Neurological: Positive for headaches. Negative for syncope.    Physical Exam Updated Vital Signs BP 139/80 (BP Location: Right Arm)   Pulse (!) 109   Temp (!) 103 F (39.4 C) (Oral)   Resp 16   SpO2 96%   Physical Exam Vitals and nursing note reviewed.  Constitutional:      General:  She is not in acute distress.    Appearance: She is well-developed.  HENT:     Head: Normocephalic and atraumatic.  Eyes:     Conjunctiva/sclera: Conjunctivae normal.  Cardiovascular:     Rate and Rhythm: Regular rhythm. Tachycardia present.  Pulmonary:     Effort: Pulmonary effort is normal. No respiratory distress.     Breath sounds: Normal breath sounds. No stridor.  Abdominal:     General: There is no distension.     Tenderness: There is no abdominal tenderness. There is no guarding.  Skin:    General: Skin is warm and dry.  Neurological:     General: No focal deficit present.     Mental Status: She is alert and oriented to person, place, and time.     Cranial Nerves: No cranial nerve deficit.     Motor: No weakness.  Psychiatric:        Mood and Affect: Mood normal.     ED Results / Procedures / Treatments   Labs (all labs ordered are listed, but only abnormal results are displayed) Labs Reviewed  COMPREHENSIVE METABOLIC PANEL - Abnormal; Notable for the  following components:      Result Value   Sodium 134 (*)    Glucose, Bld 102 (*)    All other components within normal limits  CBC WITH DIFFERENTIAL/PLATELET - Abnormal; Notable for the following components:   Hemoglobin 11.4 (*)    HCT 35.6 (*)    Lymphs Abs 0.5 (*)    All other components within normal limits  URINALYSIS, ROUTINE W REFLEX MICROSCOPIC - Abnormal; Notable for the following components:   APPearance CLOUDY (*)    Hgb urine dipstick SMALL (*)    Leukocytes,Ua SMALL (*)    Bacteria, UA RARE (*)    All other components within normal limits  POC SARS CORONAVIRUS 2 AG -  ED - Abnormal; Notable for the following components:   SARSCOV2ONAVIRUS 2 AG POSITIVE (*)    All other components within normal limits  LACTIC ACID, PLASMA  LACTIC ACID, PLASMA  I-STAT BETA HCG BLOOD, ED ([facility], [facility], [facility] ONLY)    EKG None  Radiology No results found.  Procedures Procedures   Medications Ordered in ED Medications  sodium chloride 0.9 % bolus 1,000 mL (has no administration in time range)  sodium chloride 0.9 % bolus 1,000 mL (1,000 mLs Intravenous New Bag/Given 05/21/20 1823)  ketorolac (TORADOL) 30 MG/ML injection 15 mg (15 mg Intravenous Given 05/21/20 1821)  acetaminophen (TYLENOL) tablet 1,000 mg (1,000 mg Oral Given 05/21/20 1828)  ondansetron (ZOFRAN) injection 4 mg (4 mg Intravenous Given 05/21/20 1828)    ED Course  I have reviewed the triage vital signs and the nursing notes.  Pertinent labs & imaging results that were available during my care of the patient were reviewed by me and considered in my medical decision making (see chart for details). After initial evaluation with consideration of infectious etiology given the patient's tachycardia, fever found here, labs ordered, COVID test ordered, fluid resuscitation with Toradol, Tylenol started.   Heart rate improved, though it remains above 100.  Patient's symptoms have improved in general.  She and I discussed findings,  including COVID-positive result.  Previously well adult female, not vaccinated presents with 1 day of myalgia, nausea, vomiting.  She was found to have COVID.  Patient is previously well, is relatively low risk profile is not a candidate for novel treatment, but given improvement here with fluids, Tylenol, Toradol,  will be appropriate for discharge with ongoing outpatient OTC therapy, primary care follow-up.  Heide CASIDY ALBERTA was evaluated in Emergency Department on 05/21/2020 for the symptoms described in the history of present illness. She was evaluated in the context of the global COVID-19 pandemic, which necessitated consideration that the patient might be at risk for infection with the SARS-CoV-2 virus that causes COVID-19. Institutional protocols and algorithms that pertain to the evaluation of patients at risk for COVID-19 are in a state of rapid change based on information released by regulatory bodies including the CDC and federal and state organizations. These policies and algorithms were followed during the patient's care in the ED.  Final Clinical Impression(s) / ED Diagnoses Final diagnoses:  COVID     Carmin Muskrat, MD 05/21/20 2225

## 2020-05-21 NOTE — ED Notes (Signed)
DC instructions reviewed with the pt. Pt verbalized understanding.  Pt DC.  

## 2020-09-01 ENCOUNTER — Encounter: Payer: Self-pay | Admitting: Obstetrics and Gynecology

## 2020-09-01 ENCOUNTER — Other Ambulatory Visit (HOSPITAL_COMMUNITY)
Admission: RE | Admit: 2020-09-01 | Discharge: 2020-09-01 | Disposition: A | Discharge status: Home / Self Care | Payer: Medicaid Other | Source: Ambulatory Visit | Attending: Obstetrics and Gynecology | Admitting: Obstetrics and Gynecology

## 2020-09-01 ENCOUNTER — Ambulatory Visit (INDEPENDENT_AMBULATORY_CARE_PROVIDER_SITE_OTHER): Payer: Medicaid Other | Admitting: Obstetrics and Gynecology

## 2020-09-01 ENCOUNTER — Other Ambulatory Visit: Payer: Self-pay

## 2020-09-01 VITALS — BP 126/84 | Ht 69.0 in | Wt 252.0 lb

## 2020-09-01 DIAGNOSIS — N926 Irregular menstruation, unspecified: Secondary | ICD-10-CM | POA: Diagnosis not present

## 2020-09-01 DIAGNOSIS — Z124 Encounter for screening for malignant neoplasm of cervix: Secondary | ICD-10-CM

## 2020-09-01 DIAGNOSIS — R87612 Low grade squamous intraepithelial lesion on cytologic smear of cervix (LGSIL): Secondary | ICD-10-CM | POA: Diagnosis present

## 2020-09-01 DIAGNOSIS — A549 Gonococcal infection, unspecified: Secondary | ICD-10-CM | POA: Insufficient documentation

## 2020-09-01 DIAGNOSIS — Z1151 Encounter for screening for human papillomavirus (HPV): Secondary | ICD-10-CM | POA: Diagnosis present

## 2020-09-01 DIAGNOSIS — Z113 Encounter for screening for infections with a predominantly sexual mode of transmission: Secondary | ICD-10-CM | POA: Diagnosis present

## 2020-09-01 DIAGNOSIS — B9689 Other specified bacterial agents as the cause of diseases classified elsewhere: Secondary | ICD-10-CM | POA: Diagnosis not present

## 2020-09-01 DIAGNOSIS — N760 Acute vaginitis: Secondary | ICD-10-CM

## 2020-09-01 LAB — POCT WET PREP WITH KOH
Clue Cells Wet Prep HPF POC: POSITIVE
KOH Prep POC: POSITIVE — AB
Trichomonas, UA: NEGATIVE
Yeast Wet Prep HPF POC: NEGATIVE

## 2020-09-01 LAB — POCT URINE PREGNANCY: Preg Test, Ur: NEGATIVE

## 2020-09-01 MED ORDER — METRONIDAZOLE 500 MG PO TABS: 500.0000 mg | ORAL_TABLET | Freq: Two times a day (BID) | ORAL | 0 refills | Status: DC

## 2020-09-01 NOTE — Progress Notes (Signed)
Heather Coffin, MD   Chief Complaint  Patient presents with   Menstrual Problem   Vaginitis    Pt c/o vaginal d/c and odor    HPI:      [name] is a 25 y.o. G1P1001 whose LMP was Patient's last menstrual period was 08/18/2020., presents today for increased vag d/c with odor, no irritation for the past 2 wks. No meds to treat, no prior abx use. Hx of BV during pregnancy in past. Has urinary frequency with good flow, no other urin sx, no LBP, pelvic pain, fevers.  She is sex active, no new partners. Uses condoms sometimes. Declines BC, some months doesn't use condoms and is trying to conceive, hasn't happened yet. No pain/bleeding. Pos gonorrhea 8/21, treated with rocephin.Never did TOC. Also with LGSIL pap 12/20, due for repeat pap today (not done in 1 yr). Pt also with irregular menses since 12/21. Menses used to be monthly, lasting 4 days, mod flow with small clots, no BTB, mod dysmen. Now menses are Q1 1/2-4 wks, same flow and cramping. Had gonorrhea with irregular menses in past, sx resolved after tx.  FH leio.   Past Medical History:  Diagnosis Date   Abnormal Pap smear of cervix    Chlamydia    Dysmenorrhea    Trichomonas infection    UTI (urinary tract infection)     Past Surgical History:  Procedure Laterality Date   FOOT SURGERY Right     Family History  Problem Relation Age of Onset   Diabetes Cousin    Fibroids Mother     Social History   Socioeconomic History   Marital status: Single    Spouse name: Not on file   Number of children: Not on file   Years of education: Not on file   Highest education level: Not on file  Occupational History   Not on file  Tobacco Use   Smoking status: Never   Smokeless tobacco: Never  Vaping Use   Vaping Use: Never used  Substance and Sexual Activity   Alcohol use: Yes    Comment: occasional   Drug use: No   Sexual activity: Yes    Birth control/protection: None  Other Topics Concern   Not on file   Social History Narrative   Not on file   Social Determinants of Health   Financial Resource Strain: Not on file  Food Insecurity: Not on file  Transportation Needs: Not on file  Physical Activity: Not on file  Stress: Not on file  Social Connections: Not on file  Intimate Partner Violence: Not on file    Outpatient Medications Prior to Visit  Medication Sig Dispense Refill   naproxen (NAPROSYN) 500 MG tablet Take 500 mg by mouth 2 (two) times daily as needed for mild pain. (Patient not taking: Reported on 09/01/2020)     ondansetron (ZOFRAN ODT) 4 MG disintegrating tablet Take 1 tablet (4 mg total) by mouth every 8 (eight) hours as needed for nausea or vomiting. (Patient not taking: Reported on 09/01/2020) 20 tablet 0   No facility-administered medications prior to visit.      ROS:  Review of Systems  Constitutional:  Negative for fever.  Gastrointestinal:  Negative for blood in stool, constipation, diarrhea, nausea and vomiting.  Genitourinary:  Positive for menstrual problem and vaginal discharge. Negative for dyspareunia, dysuria, flank pain, frequency, hematuria, urgency, vaginal bleeding and vaginal pain.  Musculoskeletal:  Negative for back pain.  Skin:  Negative  for rash.  BREAST: No symptoms   OBJECTIVE:   Vitals:  BP 126/84   Ht '5\' 9"'$  (1.753 m)   Wt 252 lb (114.3 kg)   LMP 08/18/2020   BMI 37.21 kg/m   Physical Exam Vitals reviewed.  Constitutional:      Appearance: She is well-developed.  Pulmonary:     Effort: Pulmonary effort is normal.  Genitourinary:    General: Normal vulva.     Pubic Area: No rash.      Labia:        Right: No rash, tenderness or lesion.        Left: No rash, tenderness or lesion.      Vagina: Normal. No vaginal discharge, erythema or tenderness.     Cervix: Normal.     Uterus: Normal. Not enlarged and not tender.      Adnexa: Right adnexa normal and left adnexa normal.       Right: No mass or tenderness.         Left: No  mass or tenderness.    Musculoskeletal:        General: Normal range of motion.     Cervical back: Normal range of motion.  Skin:    General: Skin is warm and dry.  Neurological:     General: No focal deficit present.     Mental Status: She is alert and oriented to person, place, and time.  Psychiatric:        Mood and Affect: Mood normal.        Behavior: Behavior normal.        Thought Content: Thought content normal.        Judgment: Judgment normal.    Results: Results for orders placed or performed in visit on 09/01/20 (from the past 24 hour(s))  POCT urine pregnancy     Status: Normal   Collection Time: 09/01/20 10:16 AM  Result Value Ref Range   Preg Test, Ur Negative Negative  POCT Wet Prep with KOH     Status: Abnormal   Collection Time: 09/01/20 10:17 AM  Result Value Ref Range   Trichomonas, UA Negative    Clue Cells Wet Prep HPF POC pos    Epithelial Wet Prep HPF POC     Yeast Wet Prep HPF POC neg    Bacteria Wet Prep HPF POC     RBC Wet Prep HPF POC     WBC Wet Prep HPF POC     KOH Prep POC Positive (A) Negative     Assessment/Plan: BV (bacterial vaginosis) - Plan: metroNIDAZOLE (FLAGYL) 500 MG tablet, POCT Wet Prep with KOH; pos sx and wet prep. Rx flagyl, no EtOH. Will RF if sx recur. F/u prn.   Irregular menses - Plan: POCT urine pregnancy; neg UPT. Rule out STDs. If WNL, will check GYN u/s and labs. If normal, most likely hormonal, pt prefers not to do hormones if possible so she can conceive.  Gonorrhea - Plan: Cytology - PAP; TOC today.  Cervical cancer screening - Plan: Cytology - PAP  Screening for STD (sexually transmitted disease) - Plan: Cytology - PAP  Screening for HPV (human papillomavirus) - Plan: Cytology - PAP  LGSIL on Pap smear of cervix - Plan: Cytology - PAP; will f/u with results and mgmt.    Meds ordered this encounter  Medications   metroNIDAZOLE (FLAGYL) 500 MG tablet    Sig: Take 1 tablet (500 mg total) by mouth 2 (two)  times daily  for 7 days.    Dispense:  14 tablet    Refill:  0    Order Specific Question:   Supervising Provider    Answer:   Gae Dry [id number]       Return if symptoms worsen or fail to improve.  Heather Levitan B. Berea Majkowski, PA-C 09/01/2020 10:22 AM

## 2020-09-02 LAB — CYTOLOGY - PAP
Chlamydia: NEGATIVE
Comment: NEGATIVE
Comment: NEGATIVE
Comment: NEGATIVE
Comment: NORMAL
Diagnosis: NEGATIVE
High risk HPV: NEGATIVE
Neisseria Gonorrhea: NEGATIVE
Trichomonas: NEGATIVE

## 2020-09-05 ENCOUNTER — Other Ambulatory Visit: Payer: Self-pay | Admitting: Obstetrics and Gynecology

## 2020-09-05 DIAGNOSIS — Z1329 Encounter for screening for other suspected endocrine disorder: Secondary | ICD-10-CM

## 2020-09-05 DIAGNOSIS — N926 Irregular menstruation, unspecified: Secondary | ICD-10-CM

## 2020-09-05 NOTE — Progress Notes (Signed)
Labs and Gyn u/s for irreg menses/AUB since 12/21.

## 2020-09-12 ENCOUNTER — Other Ambulatory Visit: Payer: Self-pay

## 2020-09-12 ENCOUNTER — Other Ambulatory Visit: Payer: Medicaid Other

## 2020-09-12 DIAGNOSIS — Z1329 Encounter for screening for other suspected endocrine disorder: Secondary | ICD-10-CM

## 2020-09-12 DIAGNOSIS — N926 Irregular menstruation, unspecified: Secondary | ICD-10-CM

## 2020-09-13 LAB — TSH+FREE T4
Free T4: 1.09 ng/dL (ref 0.82–1.77)
TSH: 1.9 u[IU]/mL (ref 0.450–4.500)

## 2020-09-13 LAB — PROLACTIN: Prolactin: 36.7 ng/mL — ABNORMAL HIGH (ref 4.8–23.3)

## 2020-09-15 ENCOUNTER — Ambulatory Visit: Admission: RE | Admit: 2020-09-15 | Payer: Medicaid Other | Source: Ambulatory Visit

## 2020-09-27 ENCOUNTER — Ambulatory Visit
Admission: RE | Admit: 2020-09-27 | Discharge: 2020-09-27 | Disposition: A | Discharge status: Home / Self Care | Payer: Medicaid Other | Source: Ambulatory Visit | Attending: Obstetrics and Gynecology | Admitting: Obstetrics and Gynecology

## 2020-09-27 ENCOUNTER — Other Ambulatory Visit: Payer: Self-pay

## 2020-09-27 DIAGNOSIS — N926 Irregular menstruation, unspecified: Secondary | ICD-10-CM | POA: Diagnosis present

## 2021-01-15 NOTE — L&D Delivery Note (Addendum)
Delivery Note   Heather Durham is a 26 y.o. G2P1001 at 37w0dEstimated Date of Delivery: 12/11/21 whose labor was induced for oligohydramnios.  PRE-OPERATIVE DIAGNOSIS:  1) 380w0dregnancy.  Oligohydramnios  POST-OPERATIVE DIAGNOSIS:  1) 3850w0degnancy s/p Vaginal, Spontaneous    Delivery Type: Vaginal, Spontaneous    Delivery Anesthesia: Epidural   Labor Complications: None    ESTIMATED BLOOD LOSS: 100  ml    FINDINGS:   1) female infant, Apgar scores of 8 at 1 minute and 9  at 5 minutes; birthweight 3280 g (7 lbs., 3.6 oz).     SPECIMENS:   PLACENTA:   Appearance: Intact , calcifications   Removal: Spontaneous      Disposition: To pathology  CORD BLOOD: Collected DISPOSITION:  Infant left in stable condition in the delivery room, with L&D personnel and mother.  NARRATIVE SUMMARY: Labor course:  [name] a G2P1001 at 38w56w0d presented to Labor & Delivery on 11/25/21 for induction of labor for oligohydramnios. Her initial cervical exam was FT/50/ballotable. Labor proceeded with misoprostol and Pitocin augmentation, and she was found to be completely dilated at [address] excellent maternal pushing effort, she birthed a viable female infant, [name], at 0100. There was not a nuchal cord. The shoulders were birthed without difficulty. The infant was placed skin-to-skin with [name]. The cord was doubly clamped and cut by the father when pulsations ceased. The placenta delivered spontaneously and was noted to be intact with a 3VC. A small amount of trailing membranes were removed with ring forceps. A perineal and vaginal examination was performed. Episiotomy/Lacerations: None   [name] tolerated this well. Mother and baby were left in stable condition.   [name] 11/27/2021 1:37 AM

## 2021-01-17 ENCOUNTER — Encounter: Payer: Self-pay | Admitting: Obstetrics and Gynecology

## 2021-01-17 ENCOUNTER — Other Ambulatory Visit: Payer: Self-pay | Admitting: Obstetrics and Gynecology

## 2021-01-17 DIAGNOSIS — N760 Acute vaginitis: Secondary | ICD-10-CM

## 2021-01-17 DIAGNOSIS — B9689 Other specified bacterial agents as the cause of diseases classified elsewhere: Secondary | ICD-10-CM

## 2021-01-17 MED ORDER — METRONIDAZOLE 500 MG PO TABS: 500.0000 mg | ORAL_TABLET | Freq: Two times a day (BID) | ORAL | 0 refills | Status: AC

## 2021-01-17 NOTE — Progress Notes (Signed)
Rx RF flagyl for recurrent BV sx

## 2021-05-02 ENCOUNTER — Ambulatory Visit (INDEPENDENT_AMBULATORY_CARE_PROVIDER_SITE_OTHER): Payer: Medicaid Other | Admitting: Obstetrics

## 2021-05-02 ENCOUNTER — Other Ambulatory Visit (HOSPITAL_COMMUNITY)
Admission: RE | Admit: 2021-05-02 | Discharge: 2021-05-02 | Disposition: A | Discharge status: Home / Self Care | Payer: Medicaid Other | Source: Ambulatory Visit | Attending: Obstetrics | Admitting: Obstetrics

## 2021-05-02 ENCOUNTER — Encounter: Payer: Self-pay | Admitting: Obstetrics

## 2021-05-02 VITALS — BP 122/72 | Wt 259.0 lb

## 2021-05-02 DIAGNOSIS — O0990 Supervision of high risk pregnancy, unspecified, unspecified trimester: Secondary | ICD-10-CM | POA: Insufficient documentation

## 2021-05-02 DIAGNOSIS — Z3480 Encounter for supervision of other normal pregnancy, unspecified trimester: Secondary | ICD-10-CM | POA: Insufficient documentation

## 2021-05-02 DIAGNOSIS — O219 Vomiting of pregnancy, unspecified: Secondary | ICD-10-CM | POA: Diagnosis not present

## 2021-05-02 DIAGNOSIS — Z124 Encounter for screening for malignant neoplasm of cervix: Secondary | ICD-10-CM | POA: Insufficient documentation

## 2021-05-02 DIAGNOSIS — Z113 Encounter for screening for infections with a predominantly sexual mode of transmission: Secondary | ICD-10-CM | POA: Insufficient documentation

## 2021-05-02 DIAGNOSIS — N926 Irregular menstruation, unspecified: Secondary | ICD-10-CM | POA: Diagnosis not present

## 2021-05-02 LAB — POCT URINE PREGNANCY: Preg Test, Ur: POSITIVE — AB

## 2021-05-02 MED ORDER — ONDANSETRON 4 MG PO TBDP: 4.0000 mg | ORAL_TABLET | Freq: Four times a day (QID) | ORAL | 0 refills | Status: DC | PRN

## 2021-05-02 NOTE — Progress Notes (Signed)
? ? ? ? ? ?New Obstetric Patient H&P  ? ? ?Chief Complaint: "Desires prenatal care" ? ? ?History of Present Illness: Patient is a 26 y.o. G2P1001 Not Hispanic or Latino female, LMP 03/06/2021 presents with amenorrhea and positive home pregnancy test. Based on her  LMP, her EDD is Estimated Date of Delivery: 12/11/21 and her EGA is [redacted]w[redacted]d Cycles are 5. days, regular, and occur approximately every : 28 days. Her last pap smear was about 1 years ago and was no abnormalities.  ?  ?She had a urine pregnancy test which was positive about 4 week(s)  ago. Her last menstrual period was normal and lasted for  about 5 day(s). Since her LMP she claims she has experienced fatigue and nausea.. She denies vaginal bleeding. Her past medical history is noncontributory. Her prior pregnancies are notable for none ? ?Since her LMP, she admits to the use of tobacco products  no ?She claims she has gained   no pounds since the start of her pregnancy.  ?There are cats in the home in the home  no ?She admits close contact with children on a regular basis  yes  ?She has had chicken pox in the past yes ?She has had Tuberculosis exposures, symptoms, or previously tested positive for TB   no ?Current or past history of domestic violence. no ? ?Genetic Screening/Teratology Counseling: (Includes patient, baby's father, or anyone in either family with:)  ? ?1. [facility] ?2. Thalassemia ([place] or [place] background): MCV<80  no ?3. Neural tube defect (meningomyelocele, spina bifida, anencephaly)  no ?4. Congenital heart defect  no  ?5. Down syndrome  no ?6. Tay-Sachs ([place], [place]  no ?7. Canavan's Disease  no ?8. Sickle cell disease or trait (African)  no  ?9. Hemophilia or other blood disorders  no  ?10. Muscular dystrophy  no  ?11. Cystic fibrosis  no  ?12. Huntington's Chorea  no  ?13. Mental retardation/autism  no ?14. Other inherited genetic or chromosomal disorder  no ?15. Maternal  metabolic disorder (DM, PKU, etc)  no ?16. Patient or FOB with a child with a birth defect not listed above no  ?16a. Patient or FOB with a birth defect themselves no ?17. Recurrent pregnancy loss, or stillbirth  no  ?18. Any medications since LMP other than prenatal vitamins (include vitamins, supplements, OTC meds, drugs, alcohol)  no ?19. Any other genetic/environmental exposure to discuss  no ? ?Infection History:  ? ?1. Lives with someone with TB or TB exposed  no  ?2. Patient or partner has history of genital herpes  no ?3. Rash or viral illness since LMP  no ?4. History of STI (GC, CT, HPV, syphilis, HIV)  no ?5. History of recent travel :  no ? ?Other pertinent information:  no  ? ? ? ?Review of Systems:10 point review of systems negative unless otherwise noted in HPI ? ?Past Medical History:  ?Past Medical History:  ?Diagnosis Date  ?? Abnormal Pap smear of cervix   ?? Chlamydia   ?? Dysmenorrhea   ?? Trichomonas infection   ?? UTI (urinary tract infection)   ? ? ?Past Surgical History:  ?Past Surgical History:  ?Procedure Laterality Date  ?? FOOT SURGERY Right   ? ? ?Gynecologic History: Patient's last menstrual period was 03/06/2021. ? ?Obstetric History: G2P1001 ? ?Family History:  ?Family History  ?Problem Relation Age of Onset  ?? Diabetes Cousin   ?? Fibroids Mother   ? ? ?  Social History:  ?Social History  ? ?Socioeconomic History  ?? Marital status: Single  ?  Spouse name: Not on file  ?? Number of children: Not on file  ?? Years of education: Not on file  ?? Highest education level: Not on file  ?Occupational History  ?? Not on file  ?Tobacco Use  ?? Smoking status: Never  ?? Smokeless tobacco: Never  ?Vaping Use  ?? Vaping Use: Never used  ?Substance and Sexual Activity  ?? Alcohol use: Yes  ?  Comment: occasional  ?? Drug use: No  ?? Sexual activity: Yes  ?  Birth control/protection: None  ?Other Topics Concern  ?? Not on file  ?Social History Narrative  ?? Not on file  ? ?Social Determinants of  Health  ? ?Financial Resource Strain: Not on file  ?Food Insecurity: Not on file  ?Transportation Needs: Not on file  ?Physical Activity: Not on file  ?Stress: Not on file  ?Social Connections: Not on file  ?Intimate Partner Violence: Not on file  ? ? ?Allergies:  ?No Known Allergies ? ?Medications: ?Prior to Admission medications   ?Medication Sig Start Date End Date Taking? Authorizing Provider  ?naproxen (NAPROSYN) 500 MG tablet Take 500 mg by mouth 2 (two) times daily as needed for mild pain. ?Patient not taking: Reported on 09/01/2020 02/01/19   [provider]  ?ondansetron (ZOFRAN ODT) 4 MG disintegrating tablet Take 1 tablet (4 mg total) by mouth every 8 (eight) hours as needed for nausea or vomiting. ?Patient not taking: Reported on 09/01/2020 05/21/20   Carmin Muskrat, MD  ?norethindrone (MICRONOR,CAMILA,ERRIN) 0.35 MG tablet Take 1 tablet (0.35 mg total) by mouth daily. ?Patient not taking: Reported on 07/07/2019 06/24/17 01/09/20  Donnamae Jude, MD  ? ? ?Physical Exam ?Vitals: Blood pressure 122/72, weight 259 lb (117.5 kg), last menstrual period 03/06/2021, unknown if currently breastfeeding. ? ?General: NAD ?HEENT: normocephalic, anicteric ?Thyroid: no enlargement, no palpable nodules ?Pulmonary: No increased work of breathing, CTAB ?Cardiovascular: RRR, distal pulses 2+ ?Abdomen: NABS, soft, non-tender, non-distended.  Umbilicus without lesions.  No hepatomegaly, splenomegaly or masses palpable. No evidence of hernia  ?Genitourinary: ? External: Normal external female genitalia.  Normal urethral meatus, normal  Bartholin's and Skene's glands.   ? Vagina: Normal vaginal mucosa, no evidence of prolapse.   ? Cervix: Grossly normal in appearance, no bleeding ? Uterus: anteverted Non-enlarged, mobile, normal contour.  No CMT ? Adnexa: ovaries non-enlarged, no adnexal masses ? Rectal: deferred ?Extremities: no edema, erythema, or tenderness ?Neurologic: Grossly intact ?Psychiatric: mood appropriate,  affect full ? ? ?Assessment: 26 y.o. G2P1001 at 71w1dpresenting to initiate prenatal care ? ?Plan: ?1) Avoid alcoholic beverages. ?2) Patient encouraged not to smoke.  ?3) Discontinue the use of all non-medicinal drugs and chemicals.  ?4) Take prenatal vitamins daily.  ?5) Nutrition, food safety (fish, cheese advisories, and high nitrite foods) and exercise discussed. ?6) Hospital and practice style discussed with cross coverage system.  ?7) Genetic Screening, such as with 1st Trimester Screening, cell free fetal DNA, AFP testing, and Ultrasound, as well as with amniocentesis and CVS as appropriate, is discussed with patient. At the conclusion of today's visit patient undecided genetic testing ?8) Patient is asked about travel to areas at risk for the Zika virus, and counseled to avoid travel and exposure to mosquitoes or sexual partners who may have themselves been exposed to the virus. Testing is discussed, and will be ordered as appropriate.  ? ?Pap smear and Aptima swab retrieved today. ?  She will have her blood work done at next visit. ?Briefly discussed genetic testing. Please review this at her next visit. ?The following were addressed during this visit: ? ?Breastfeeding Education ?- Early initiation of breastfeeding  ?  Comments: Keeps milk supply adequate, helps contract uterus and slow bleeding, and early milk is the perfect first food and is easy to digest. ? ? ?- The importance of exclusive breastfeeding  ?  Comments: Provides antibodies, Lower risk of breast and ovarian cancers, and type-2 diabetes,Helps your body recover, Reduced chance of SIDS. ? ? ?- Risks of giving your baby anything other than breast milk if you are breastfeeding  ?  Comments: Make the baby less content with breastfeeds, may make my baby more susceptible to illness, and may reduce my milk supply. ? ? ?- Nonpharmacological pain relief methods for labor  ?  Comments: Deep breathing, focusing on pleasant things, movement and walking,  heating pads or cold compress, massage and relaxation, continuous support from someone you trust, and Doulas ? ? ?- The importance of early skin-to-skin contact  ?  Comments: Keeps baby warm and secure, help

## 2021-05-04 LAB — CERVICOVAGINAL ANCILLARY ONLY
Bacterial Vaginitis (gardnerella): POSITIVE — AB
Chlamydia: NEGATIVE
Comment: NEGATIVE
Comment: NEGATIVE
Comment: NEGATIVE
Comment: NORMAL
Neisseria Gonorrhea: NEGATIVE
Trichomonas: NEGATIVE

## 2021-05-04 LAB — CYTOLOGY - PAP: Diagnosis: NEGATIVE

## 2021-05-04 LAB — URINE CULTURE

## 2021-05-05 ENCOUNTER — Other Ambulatory Visit: Payer: Self-pay | Admitting: Obstetrics

## 2021-05-05 ENCOUNTER — Encounter: Payer: Self-pay | Admitting: Obstetrics

## 2021-05-05 DIAGNOSIS — B9689 Other specified bacterial agents as the cause of diseases classified elsewhere: Secondary | ICD-10-CM

## 2021-05-05 MED ORDER — METRONIDAZOLE 500 MG PO TABS: 500.0000 mg | ORAL_TABLET | Freq: Two times a day (BID) | ORAL | 0 refills | Status: AC

## 2021-05-10 ENCOUNTER — Encounter: Payer: Self-pay | Admitting: Obstetrics

## 2021-05-25 ENCOUNTER — Ambulatory Visit (INDEPENDENT_AMBULATORY_CARE_PROVIDER_SITE_OTHER): Payer: Medicaid Other

## 2021-05-25 DIAGNOSIS — Z3A12 12 weeks gestation of pregnancy: Secondary | ICD-10-CM

## 2021-05-25 DIAGNOSIS — Z3481 Encounter for supervision of other normal pregnancy, first trimester: Secondary | ICD-10-CM | POA: Diagnosis not present

## 2021-05-25 DIAGNOSIS — Z3480 Encounter for supervision of other normal pregnancy, unspecified trimester: Secondary | ICD-10-CM

## 2021-05-29 ENCOUNTER — Encounter: Payer: Medicaid Other | Admitting: Licensed Practical Nurse

## 2021-05-29 ENCOUNTER — Ambulatory Visit (INDEPENDENT_AMBULATORY_CARE_PROVIDER_SITE_OTHER): Payer: Medicaid Other | Admitting: Advanced Practice Midwife

## 2021-05-29 VITALS — BP 100/80 | Wt 267.0 lb

## 2021-05-29 DIAGNOSIS — Z113 Encounter for screening for infections with a predominantly sexual mode of transmission: Secondary | ICD-10-CM

## 2021-05-29 DIAGNOSIS — Z3A12 12 weeks gestation of pregnancy: Secondary | ICD-10-CM

## 2021-05-29 DIAGNOSIS — Z1379 Encounter for other screening for genetic and chromosomal anomalies: Secondary | ICD-10-CM

## 2021-05-29 DIAGNOSIS — O99211 Obesity complicating pregnancy, first trimester: Secondary | ICD-10-CM

## 2021-05-29 DIAGNOSIS — Z130 Encounter for screening for diseases of the blood and blood-forming organs and certain disorders involving the immune mechanism: Secondary | ICD-10-CM

## 2021-05-29 DIAGNOSIS — O99210 Obesity complicating pregnancy, unspecified trimester: Secondary | ICD-10-CM | POA: Insufficient documentation

## 2021-05-29 DIAGNOSIS — Z131 Encounter for screening for diabetes mellitus: Secondary | ICD-10-CM

## 2021-05-29 DIAGNOSIS — Z3480 Encounter for supervision of other normal pregnancy, unspecified trimester: Secondary | ICD-10-CM

## 2021-05-29 DIAGNOSIS — Z369 Encounter for antenatal screening, unspecified: Secondary | ICD-10-CM

## 2021-05-29 LAB — POCT URINALYSIS DIPSTICK OB
Glucose, UA: NEGATIVE
POC,PROTEIN,UA: NEGATIVE

## 2021-05-29 LAB — OB RESULTS CONSOLE VARICELLA ZOSTER ANTIBODY, IGG: Varicella: NON-IMMUNE/NOT IMMUNE

## 2021-05-29 NOTE — Addendum Note (Signed)
Addended by: Drenda Freeze on: 05/29/2021 08:50 AM ? ? Modules accepted: Orders ? ?

## 2021-05-29 NOTE — Progress Notes (Signed)
ROB- no concerns 

## 2021-05-29 NOTE — Progress Notes (Signed)
Routine Prenatal Care Visit ? ?Subjective  ?Heather Durham is a 26 y.o. G2P1001 at 3w0dbeing seen today for ongoing prenatal care.  She is currently monitored for the following issues for this low-risk pregnancy and has Pica in adults; Multiple hemangiomas; Gonorrhea; BV (bacterial vaginosis); Supervision of other normal pregnancy, antepartum; and Obesity affecting pregnancy on their problem list.  ?----------------------------------------------------------------------------------- ?Patient reports swollen feet after increased walking this weekend. She admits limited exercise generally and attended her sister's graduation. Comfort measures and recommendations reviewed; increase hydration, elevate legs, decreased carbs, increased physical activity.   ? . Vag. Bleeding: None.   . Leaking Fluid denies.  ?----------------------------------------------------------------------------------- ?The following portions of the patient's history were reviewed and updated as appropriate: allergies, current medications, past family history, past medical history, past social history, past surgical history and problem list. Problem list updated. ? ?Objective  ?Blood pressure 100/80, weight 267 lb (121.1 kg), last menstrual period 03/06/2021, unknown if currently breastfeeding. ?Pregravid weight 255 lb (115.7 kg) Total Weight Gain 12 lb (5.443 kg) ?Urinalysis: Urine Protein    Urine Glucose   ? ?Fetal Status: Fetal Heart Rate (bpm): 163        ? ?General:  Alert, oriented and cooperative. Patient is in no acute distress.  ?Skin: Skin is warm and dry. No rash noted.   ?Cardiovascular: Normal heart rate noted  ?Respiratory: Normal respiratory effort, no problems with respiration noted  ?Abdomen: Soft, gravid, appropriate for gestational age. Pain/Pressure: Absent     ?Pelvic:  Cervical exam deferred        ?Extremities: Normal range of motion.     ?Mental Status: Normal mood and affect. Normal behavior. Normal judgment and thought  content.  ? ?Assessment  ? ?26y.o. G2P1001 at 175w0dy  12/11/2021, by Last Menstrual Period presenting for routine prenatal visit ? ?Plan  ? ?pregnancy 2  Problems (from 05/02/21 to present)   ? Problem Noted Resolved  ? Obesity affecting pregnancy 05/29/2021 by GlRod CanCNM No  ? Supervision of other normal pregnancy, antepartum 05/02/2021 by FrImagene RichesCNM No  ? Overview Addendum 05/05/2021  1:49 PM by FrImagene RichesCNM  ?   ?Nursing Staff Provider  ?Office Location  [facility] Dating  By LMP c/w 12w u/s  ?Language  English Anatomy [place]  ?Flu Vaccine   Genetic Screen  NIPS:   ?TDaP vaccine    Hgb A1C or  ?GTT Early : ?Third trimester :   ?Covid    LAB RESULTS   ?Rhogam   Blood Type     ?Feeding Plan  Antibody    ?Contraception  Rubella    ?Circumcision  RPR     ?Pediatrician   HBsAg     ?Support Person  HIV    ?Prenatal Classes  Varicella   ?  GBS  (For PCN allergy, check sensitivities)   ?BTL Consent     ?VBAC Consent  Pap  NILM  ?  Hgb Electro    ?  CF   ?   SMA   ?     ? ? ?  ?  ?  ?  ? ?Preterm labor symptoms and general obstetric precautions including but not limited to vaginal bleeding, contractions, leaking of fluid and fetal movement were reviewed in detail with the patient. ?Please refer to After Visit Summary for other counseling recommendations.  ? ?Return in about 4 weeks (around 06/26/2021) for rob. ? ?JaRod CanCNM ?05/29/2021 8:45 AM   ? ?

## 2021-05-31 LAB — CBC/D/PLT+RPR+RH+ABO+RUBIGG...
Antibody Screen: NEGATIVE
Basophils Absolute: 0 10*3/uL (ref 0.0–0.2)
Basos: 0 %
EOS (ABSOLUTE): 0.1 10*3/uL (ref 0.0–0.4)
Eos: 1 %
HCV Ab: NONREACTIVE
HIV Screen 4th Generation wRfx: NONREACTIVE
Hematocrit: 34.4 % (ref 34.0–46.6)
Hemoglobin: 11.4 g/dL (ref 11.1–15.9)
Hepatitis B Surface Ag: NEGATIVE
Immature Grans (Abs): 0 10*3/uL (ref 0.0–0.1)
Immature Granulocytes: 1 %
Lymphocytes Absolute: 2.7 10*3/uL (ref 0.7–3.1)
Lymphs: 33 %
MCH: 29.3 pg (ref 26.6–33.0)
MCHC: 33.1 g/dL (ref 31.5–35.7)
MCV: 88 fL (ref 79–97)
Monocytes Absolute: 0.5 10*3/uL (ref 0.1–0.9)
Monocytes: 6 %
Neutrophils Absolute: 5 10*3/uL (ref 1.4–7.0)
Neutrophils: 59 %
Platelets: 260 10*3/uL (ref 150–450)
RBC: 3.89 x10E6/uL (ref 3.77–5.28)
RDW: 12.3 % (ref 11.7–15.4)
RPR Ser Ql: NONREACTIVE
Rh Factor: POSITIVE
Rubella Antibodies, IGG: 2.75 index (ref >0.99)
Varicella zoster IgG: 137 index — ABNORMAL LOW (ref >165)
WBC: 8.3 10*3/uL (ref 3.4–10.8)

## 2021-05-31 LAB — HGB FRACTIONATION CASCADE
Hgb A2: 2.7 % (ref 1.8–3.2)
Hgb A: 97.3 % (ref 96.4–98.8)
Hgb F: 0 % (ref 0.0–2.0)
Hgb S: 0 %

## 2021-05-31 LAB — HCV INTERPRETATION

## 2021-05-31 LAB — HGB A1C W/O EAG: Hgb A1c MFr Bld: 5.4 % (ref 4.8–5.6)

## 2021-06-03 LAB — MATERNIT21 PLUS CORE+SCA
Fetal Fraction: 6
Monosomy X (Turner Syndrome): NOT DETECTED
Result (T21): NEGATIVE
Trisomy 13 (Patau syndrome): NEGATIVE
Trisomy 18 (Edwards syndrome): NEGATIVE
Trisomy 21 (Down syndrome): NEGATIVE
XXX (Triple X Syndrome): NOT DETECTED
XXY (Klinefelter Syndrome): NOT DETECTED
XYY (Jacobs Syndrome): NOT DETECTED

## 2021-06-26 ENCOUNTER — Encounter: Payer: Self-pay | Admitting: Obstetrics

## 2021-06-26 ENCOUNTER — Ambulatory Visit (INDEPENDENT_AMBULATORY_CARE_PROVIDER_SITE_OTHER): Payer: Medicaid Other | Admitting: Obstetrics

## 2021-06-26 VITALS — BP 130/82 | Wt 262.6 lb

## 2021-06-26 DIAGNOSIS — Z3480 Encounter for supervision of other normal pregnancy, unspecified trimester: Secondary | ICD-10-CM

## 2021-06-26 DIAGNOSIS — Z3A16 16 weeks gestation of pregnancy: Secondary | ICD-10-CM

## 2021-06-26 LAB — POCT URINALYSIS DIPSTICK OB
Glucose, UA: NEGATIVE
POC,PROTEIN,UA: NEGATIVE

## 2021-06-26 NOTE — Progress Notes (Signed)
Routine Prenatal Care Visit  Subjective  Heather Durham is a 26 y.o. G2P1001 at 55w0dbeing seen today for ongoing prenatal care.  She is currently monitored for the following issues for this high-risk pregnancy and has Pica in adults; Multiple hemangiomas; Gonorrhea; BV (bacterial vaginosis); Supervision of other normal pregnancy, antepartum; and Obesity affecting pregnancy on their problem list.  ----------------------------------------------------------------------------------- Patient reports no complaints.  She still has some occasional nausea, but it is less than previously. Feels flutters. Contractions: Not present. Vag. Bleeding: None.  Movement: Present. Leaking Fluid denies.  ----------------------------------------------------------------------------------- The following portions of the patient's history were reviewed and updated as appropriate: allergies, current medications, past family history, past medical history, past social history, past surgical history and problem list. Problem list updated.  Objective  Blood pressure 130/82, weight 262 lb 9.6 oz (119.1 kg), last menstrual period 03/06/2021, unknown if currently breastfeeding. Pregravid weight 255 lb (115.7 kg) Total Weight Gain 7 lb 9.6 oz (3.447 kg) Urinalysis: Urine Protein Negative  Urine Glucose Negative  Fetal Status:     Movement: Present     General:  Alert, oriented and cooperative. Patient is in no acute distress.  Skin: Skin is warm and dry. No rash noted.   Cardiovascular: Normal heart rate noted  Respiratory: Normal respiratory effort, no problems with respiration noted  Abdomen: Soft, gravid, appropriate for gestational age. Pain/Pressure: Absent     Pelvic:  Cervical exam deferred        Extremities: Normal range of motion.     Mental Status: Normal mood and affect. Normal behavior. Normal judgment and thought content.   Assessment   26y.o. G2P1001 at 152w0dy  12/11/2021, by Last Menstrual Period  presenting for routine prenatal visit  Plan   pregnancy 2  Problems (from 05/02/21 to present)    Problem Noted Resolved   Obesity affecting pregnancy 05/29/2021 by GlRod CanCNM No   Supervision of other normal pregnancy, antepartum 05/02/2021 by FrImagene RichesCNM No   Overview Addendum 06/26/2021  4:26 PM by FrImagene RichesCNM     Nursing Staff Provider  Office Location  [facility] Dating  EDD by LMP c/w 12w u/s  Language  English Anatomy [place]  Flu Vaccine   Genetic Screen  NIPS:   TDaP vaccine    Hgb A1C or  GTT Early : Third trimester :   Covid    LAB RESULTS   Rhogam   Blood Type O/Positive/-- (05/15 0848)   Feeding Plan  Antibody Negative (05/15 0848)  Contraception  Rubella 2.75 (05/15 0848)  Circumcision  RPR Non Reactive (05/15 0848)   Pediatrician   HBsAg Negative (05/15 0848)   Support Person  HIV Non Reactive (05/15 0848)  Prenatal Classes  Varicella     GBS  (For PCN allergy, check sensitivities)   BTL Consent     VBAC Consent  Pap  NILM    Hgb Electro      CF      SMA                   Preterm labor symptoms and general obstetric precautions including but not limited to vaginal bleeding, contractions, leaking of fluid and fetal movement were reviewed in detail with the patient. Please refer to After Visit Summary for other counseling recommendations.   Anatomy scan is ordered for her. MaImagene RichesCNM  06/26/2021 5:51 PM    Return in about 4 weeks (around 07/24/2021) for  return OB.  Imagene Riches, CNM  06/26/2021 4:27 PM

## 2021-06-26 NOTE — Progress Notes (Signed)
  Denies pain/presure but states she has tightness in lower abdominal area

## 2021-07-17 ENCOUNTER — Ambulatory Visit
Admission: RE | Admit: 2021-07-17 | Discharge: 2021-07-17 | Disposition: A | Discharge status: Home / Self Care | Payer: Medicaid Other | Source: Ambulatory Visit | Attending: Obstetrics | Admitting: Obstetrics

## 2021-07-17 DIAGNOSIS — Z3A19 19 weeks gestation of pregnancy: Secondary | ICD-10-CM | POA: Insufficient documentation

## 2021-07-17 DIAGNOSIS — Z3689 Encounter for other specified antenatal screening: Secondary | ICD-10-CM | POA: Diagnosis not present

## 2021-07-17 DIAGNOSIS — O321XX0 Maternal care for breech presentation, not applicable or unspecified: Secondary | ICD-10-CM | POA: Diagnosis not present

## 2021-07-17 DIAGNOSIS — Z3480 Encounter for supervision of other normal pregnancy, unspecified trimester: Secondary | ICD-10-CM | POA: Insufficient documentation

## 2021-07-24 ENCOUNTER — Other Ambulatory Visit: Payer: Self-pay | Admitting: Obstetrics

## 2021-07-24 DIAGNOSIS — Z3480 Encounter for supervision of other normal pregnancy, unspecified trimester: Secondary | ICD-10-CM

## 2021-07-25 ENCOUNTER — Ambulatory Visit (INDEPENDENT_AMBULATORY_CARE_PROVIDER_SITE_OTHER): Payer: Self-pay | Admitting: Family Medicine

## 2021-07-25 VITALS — BP 126/80 | Wt 268.0 lb

## 2021-07-25 DIAGNOSIS — O0990 Supervision of high risk pregnancy, unspecified, unspecified trimester: Secondary | ICD-10-CM

## 2021-07-25 DIAGNOSIS — O99212 Obesity complicating pregnancy, second trimester: Secondary | ICD-10-CM

## 2021-07-25 NOTE — Progress Notes (Signed)
No vb. No lof. Pt has had nausea.

## 2021-07-25 NOTE — Progress Notes (Signed)
    PRENATAL VISIT NOTE  Subjective:  Heather Durham is a 26 y.o. G2P1001 at 50w1dbeing seen today for ongoing prenatal care.  She is currently monitored for the following issues for this high-risk pregnancy and has Pica in adults; Multiple hemangiomas; Gonorrhea; BV (bacterial vaginosis); Supervision of high risk pregnancy, antepartum; and Obesity affecting pregnancy on their problem list.  Patient reports no complaints.  Contractions: Not present. Vag. Bleeding: None.  Movement: Present. Denies leaking of fluid.   The following portions of the patient's history were reviewed and updated as appropriate: allergies, current medications, past family history, past medical history, past social history, past surgical history and problem list.   Objective:   Vitals:   07/25/21 1600  BP: 126/80  Weight: 268 lb (121.6 kg)    Fetal Status: Fetal Heart Rate (bpm): 145 Fundal Height: 20 cm Movement: Present     General:  Alert, oriented and cooperative. Patient is in no acute distress.  Skin: Skin is warm and dry. No rash noted.   Cardiovascular: Normal heart rate noted  Respiratory: Normal respiratory effort, no problems with respiration noted  Abdomen: Soft, gravid, appropriate for gestational age.  Pain/Pressure: Absent     Pelvic: Cervical exam deferred        Extremities: Normal range of motion.     Mental Status: Normal mood and affect. Normal behavior. Normal judgment and thought content.   Assessment and Plan:  Pregnancy: G2P1001 at 215w1d1. Obesity affecting pregnancy in second trimester TWG=13 lb (5.897 kg)  - [place] OB COMP + 14 WK; Future  2. Supervision of high risk pregnancy, antepartum UTD Nausea improved Needs images of spine and face per anatomy scan - [place] OB COMP + 14 WK; Future  Preterm labor symptoms and general obstetric precautions including but not limited to vaginal bleeding, contractions, leaking of fluid and fetal movement were reviewed in detail with  the patient. Please refer to After Visit Summary for other counseling recommendations.   Return in about 4 weeks (around 08/22/2021) for Routine prenatal care.  No future appointments.  KiCaren MacadamMD

## 2021-07-27 NOTE — Addendum Note (Signed)
Addended by: Meryl Dare on: 07/27/2021 12:30 PM   Modules accepted: Orders

## 2021-08-10 ENCOUNTER — Encounter: Payer: Self-pay | Admitting: Family Medicine

## 2021-08-11 ENCOUNTER — Other Ambulatory Visit: Payer: Self-pay

## 2021-08-11 DIAGNOSIS — B9689 Other specified bacterial agents as the cause of diseases classified elsewhere: Secondary | ICD-10-CM

## 2021-08-11 NOTE — Telephone Encounter (Signed)
Does she need the gel or is it safe to take metronidazole in pregnancy?

## 2021-08-16 ENCOUNTER — Other Ambulatory Visit (HOSPITAL_COMMUNITY)
Admission: RE | Admit: 2021-08-16 | Discharge: 2021-08-16 | Disposition: A | Discharge status: Home / Self Care | Payer: Medicaid Other | Source: Ambulatory Visit | Attending: Obstetrics | Admitting: Obstetrics

## 2021-08-16 ENCOUNTER — Ambulatory Visit (INDEPENDENT_AMBULATORY_CARE_PROVIDER_SITE_OTHER): Payer: Medicaid Other

## 2021-08-16 DIAGNOSIS — B9689 Other specified bacterial agents as the cause of diseases classified elsewhere: Secondary | ICD-10-CM | POA: Insufficient documentation

## 2021-08-16 DIAGNOSIS — N760 Acute vaginitis: Secondary | ICD-10-CM | POA: Insufficient documentation

## 2021-08-17 LAB — CERVICOVAGINAL ANCILLARY ONLY
Bacterial Vaginitis (gardnerella): POSITIVE — AB
Candida Glabrata: NEGATIVE
Candida Vaginitis: NEGATIVE
Chlamydia: NEGATIVE
Comment: NEGATIVE
Comment: NEGATIVE
Comment: NEGATIVE
Comment: NEGATIVE
Comment: NEGATIVE
Comment: NORMAL
Neisseria Gonorrhea: NEGATIVE
Trichomonas: NEGATIVE

## 2021-08-17 NOTE — Progress Notes (Signed)
Pls f/u with pt. It was under MMF but coming to me. Strange. Thx.

## 2021-08-18 ENCOUNTER — Encounter: Payer: Self-pay | Admitting: Licensed Practical Nurse

## 2021-08-18 ENCOUNTER — Other Ambulatory Visit: Payer: Self-pay | Admitting: Licensed Practical Nurse

## 2021-08-18 DIAGNOSIS — B9689 Other specified bacterial agents as the cause of diseases classified elsewhere: Secondary | ICD-10-CM

## 2021-08-18 MED ORDER — METRONIDAZOLE 500 MG PO TABS: 500.0000 mg | ORAL_TABLET | Freq: Two times a day (BID) | ORAL | 0 refills | Status: DC

## 2021-08-18 NOTE — Progress Notes (Signed)
Pt swabs positive for BV, script sent for Flagyl Message sent through [facility] [place]  08/18/21  9:00 PM

## 2021-08-20 ENCOUNTER — Encounter: Payer: Self-pay | Admitting: Licensed Practical Nurse

## 2021-08-20 ENCOUNTER — Other Ambulatory Visit: Payer: Self-pay | Admitting: Licensed Practical Nurse

## 2021-08-20 DIAGNOSIS — N760 Acute vaginitis: Secondary | ICD-10-CM

## 2021-08-20 MED ORDER — METRONIDAZOLE 500 MG PO TABS: 500.0000 mg | ORAL_TABLET | Freq: Two times a day (BID) | ORAL | 0 refills | Status: DC

## 2021-08-20 NOTE — Progress Notes (Signed)
Script sent to pharmacy in [place] per pt request Heather Durham, [place]   08/20/21  12:14 PM

## 2021-08-23 ENCOUNTER — Ambulatory Visit (INDEPENDENT_AMBULATORY_CARE_PROVIDER_SITE_OTHER): Payer: Medicaid Other | Admitting: Licensed Practical Nurse

## 2021-08-23 ENCOUNTER — Encounter: Payer: Self-pay | Admitting: Licensed Practical Nurse

## 2021-08-23 VITALS — BP 128/74 | Wt 275.0 lb

## 2021-08-23 DIAGNOSIS — O0990 Supervision of high risk pregnancy, unspecified, unspecified trimester: Secondary | ICD-10-CM

## 2021-08-23 DIAGNOSIS — Z3A24 24 weeks gestation of pregnancy: Secondary | ICD-10-CM

## 2021-08-23 NOTE — Patient Instructions (Addendum)
Magnesium oxide '400mg'$  daily Riboflavin '400mg'$  daily Co enzyme Q 10 '200mg'$  to '400mg'$  daily  Headache cocktail 2 extra strength Tylenol, 25-50 mg Benadryl, 1 can of coke

## 2021-08-23 NOTE — Progress Notes (Signed)
Routine Prenatal Care Visit  Subjective  Heather Durham is a 26 y.o. G2P1001 at 67w2dbeing seen today for ongoing prenatal care.  She is currently monitored for the following issues for this high-risk pregnancy and has Pica in adults; Multiple hemangiomas; Gonorrhea; BV (bacterial vaginosis); Supervision of high risk pregnancy, antepartum; and Obesity affecting pregnancy on their problem list.  ----------------------------------------------------------------------------------- Patient reports headache.  The headaches occur every other day, they last for an hour to a a couple of hours, the pain is in the back of the head and sometimes in the front, it is throbbing. She will use a cool cloth with some relief. She has not tried Tylenol.  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 Heather Durham feels that the HA is related to how much time she spends at the computer. Denies hx of preeclampsia. Denies visual disturbances or RUQ pain. Comfort measure reviewed, BP normal,  Aware to call with any severe HA that do not go away with treatment  -hs low back pain, notices it when she moves from sitting to standing and then at night when she is sleeping. Does not have a support band.  Will try heat, ice, Tylenol, stretches and support belt. If there is no improvement then she would like to try PT or chiro -desires waterbirth, waterbirth info sent through [employer] Not present. Vag. Bleeding: None.  Movement: Present. Leaking Fluid denies.  ----------------------------------------------------------------------------------- The following portions of the patient's history were reviewed and updated as appropriate: allergies, current medications, past family history, past medical history, past social history, past surgical history and problem list. Problem list updated.  Objective  Blood pressure 128/74, weight 275 lb (124.7 kg), last menstrual period 03/06/2021, unknown if currently  breastfeeding. Pregravid weight 255 lb (115.7 kg) Total Weight Gain 20 lb (9.072 kg) Urinalysis: Urine Protein    Urine Glucose    Fetal Status: Fetal Heart Rate (bpm): 150 Fundal Height: 27 cm Movement: Present     General:  Alert, oriented and cooperative. Patient is in no acute distress.  Skin: Skin is warm and dry. No rash noted.   Cardiovascular: Normal heart rate noted  Respiratory: Normal respiratory effort, no problems with respiration noted  Abdomen: Soft, gravid, appropriate for gestational age.       Pelvic:  Cervical exam deferred        Extremities: Normal range of motion.     Mental Status: Normal mood and affect. Normal behavior. Normal judgment and thought content.   Assessment   26y.o. G2P1001 at 251w2dy  12/11/2021, by Last Menstrual Period presenting for routine prenatal visit  Plan   pregnancy 2  Problems (from 05/02/21 to present)     Problem Noted Resolved   Obesity affecting pregnancy 05/29/2021 by Heather Durham No   Supervision of high risk pregnancy, antepartum 05/02/2021 by Heather Durham No   Overview Addendum 06/26/2021  4:26 PM by Heather Durham     Nursing Staff Provider  Office Location  [facility] Dating  EDD by LMP c/w 12w u/s  Language  English Anatomy [place]  Flu Vaccine   Genetic Screen  NIPS:   TDaP vaccine    Hgb A1C or  GTT Early : Third trimester :   Covid    LAB RESULTS   Rhogam   Blood Type O/Positive/-- (05/15 0848)   Feeding Plan  Antibody Negative (05/15 0848)  Contraception  Rubella 2.75 (  05/15 0848)  Circumcision  RPR Non Reactive (05/15 0848)   Pediatrician   HBsAg Negative (05/15 0848)   Support Person  HIV Non Reactive (05/15 0848)  Prenatal Classes  Varicella     GBS  (For PCN allergy, check sensitivities)   BTL Consent     VBAC Consent  Pap  NILM    Hgb Electro      CF      SMA                    Preterm labor symptoms and general obstetric precautions including but not limited to vaginal  bleeding, contractions, leaking of fluid and fetal movement were reviewed in detail with the patient. Please refer to After Visit Summary for other counseling recommendations.   Return in about 4 weeks (around 09/20/2021) for ROB, 28 wk labs.  Has fu [place] with MFM 8/17   Heather Durham, [place]  08/23/21  11:48 AM

## 2021-08-23 NOTE — Progress Notes (Signed)
No vb. No lof.  

## 2021-08-31 ENCOUNTER — Ambulatory Visit: Payer: Medicaid Other | Attending: Maternal & Fetal Medicine

## 2021-08-31 ENCOUNTER — Other Ambulatory Visit: Payer: Self-pay | Admitting: Family Medicine

## 2021-08-31 ENCOUNTER — Other Ambulatory Visit: Payer: Self-pay

## 2021-08-31 DIAGNOSIS — O99212 Obesity complicating pregnancy, second trimester: Secondary | ICD-10-CM

## 2021-08-31 DIAGNOSIS — E669 Obesity, unspecified: Secondary | ICD-10-CM

## 2021-08-31 DIAGNOSIS — O280 Abnormal hematological finding on antenatal screening of mother: Secondary | ICD-10-CM

## 2021-08-31 DIAGNOSIS — D1800 Hemangioma unspecified site: Secondary | ICD-10-CM | POA: Diagnosis not present

## 2021-08-31 DIAGNOSIS — O0990 Supervision of high risk pregnancy, unspecified, unspecified trimester: Secondary | ICD-10-CM

## 2021-08-31 DIAGNOSIS — Z3A25 25 weeks gestation of pregnancy: Secondary | ICD-10-CM

## 2021-09-11 ENCOUNTER — Other Ambulatory Visit: Payer: Self-pay

## 2021-09-11 ENCOUNTER — Inpatient Hospital Stay (HOSPITAL_COMMUNITY)
Admission: AD | Admit: 2021-09-11 | Discharge: 2021-09-11 | Disposition: A | Discharge status: Home / Self Care | Payer: Medicaid Other | Attending: Obstetrics and Gynecology | Admitting: Obstetrics and Gynecology

## 2021-09-11 DIAGNOSIS — X58XXXA Exposure to other specified factors, initial encounter: Secondary | ICD-10-CM | POA: Diagnosis not present

## 2021-09-11 DIAGNOSIS — O99891 Other specified diseases and conditions complicating pregnancy: Secondary | ICD-10-CM

## 2021-09-11 DIAGNOSIS — M549 Dorsalgia, unspecified: Secondary | ICD-10-CM | POA: Insufficient documentation

## 2021-09-11 DIAGNOSIS — O26892 Other specified pregnancy related conditions, second trimester: Secondary | ICD-10-CM | POA: Diagnosis present

## 2021-09-11 DIAGNOSIS — Z3A27 27 weeks gestation of pregnancy: Secondary | ICD-10-CM | POA: Insufficient documentation

## 2021-09-11 DIAGNOSIS — W19XXXA Unspecified fall, initial encounter: Secondary | ICD-10-CM | POA: Diagnosis not present

## 2021-09-11 MED ORDER — CYCLOBENZAPRINE HCL 5 MG PO TABS
10.0000 mg | ORAL_TABLET | Freq: Once | ORAL | Status: AC
  Administered 2021-09-11: 10 mg via ORAL
  Filled 2021-09-11: qty 2

## 2021-09-11 MED ORDER — CYCLOBENZAPRINE HCL 10 MG PO TABS: 10.0000 mg | ORAL_TABLET | Freq: Two times a day (BID) | ORAL | 0 refills | Status: DC | PRN

## 2021-09-11 NOTE — MAU Provider Note (Signed)
History     CSN: [id number]  Arrival date and time: 09/11/21 2010   Event Date/Time   First Provider Initiated Contact with Patient 09/11/21 2057      Chief Complaint  Patient presents with   Fall   HPI  Heather Durham is a 26 y.o. G2P1001 at 79w0dwho presents for evaluation of hip and back pain after a fall. Patient reports she slipped on a rug in her child's room and fell at 1915. She landed on her hip and back. She had no impact to her abdomen. Patient rates the pain as a 8/10 and has not tried anything for the pain. She denies any vaginal bleeding, discharge, and leaking of fluid. Denies any constipation, diarrhea or any urinary complaints. Reports normal fetal movement.   OB History     Gravida  2   Para  1   Term  1   Preterm  0   AB  0   Living  1      SAB  0   IAB  0   Ectopic  0   Multiple  0   Live Births  1           Past Medical History:  Diagnosis Date   Abnormal Pap smear of cervix    Chlamydia    Dysmenorrhea    Trichomonas infection    UTI (urinary tract infection)     Past Surgical History:  Procedure Laterality Date   FOOT SURGERY Right     Family History  Problem Relation Age of Onset   Diabetes Cousin    Fibroids Mother     Social History   Tobacco Use   Smoking status: Never   Smokeless tobacco: Never  Vaping Use   Vaping Use: Never used  Substance Use Topics   Alcohol use: Yes    Comment: occasional   Drug use: No    Allergies: No Known Allergies  Medications Prior to Admission  Medication Sig Dispense Refill Last Dose   metroNIDAZOLE (FLAGYL) 500 MG tablet Take 1 tablet (500 mg total) by mouth 2 (two) times daily. (Patient not taking: Reported on 08/31/2021) 14 tablet 0    Prenatal Vit-Fe Fumarate-FA (PRENATAL MULTIVITAMIN) TABS tablet Take 1 tablet by mouth daily at 12 noon.       Review of Systems  Constitutional: Negative.  Negative for fatigue and fever.  HENT: Negative.    Respiratory:  Negative.  Negative for shortness of breath.   Cardiovascular: Negative.  Negative for chest pain.  Gastrointestinal: Negative.  Negative for abdominal pain, constipation, diarrhea, nausea and vomiting.  Genitourinary:  Positive for pelvic pain. Negative for dysuria, vaginal bleeding and vaginal discharge.  Musculoskeletal:  Positive for back pain.  Neurological: Negative.  Negative for dizziness and headaches.   Physical Exam   Blood pressure 120/75, pulse 96, temperature 98.1 F (36.7 C), temperature source Oral, resp. rate 18, height '5\' 9"'$  (1.753 m), weight 125.3 kg, last menstrual period 03/06/2021, unknown if currently breastfeeding.  Patient Vitals for the past 24 hrs:  BP Temp Temp src Pulse Resp Height Weight  09/11/21 2032 120/75 98.1 F (36.7 C) Oral 96 18 '5\' 9"'$  (1.753 m) 125.3 kg    Physical Exam Vitals and nursing note reviewed.  Constitutional:      General: She is not in acute distress.    Appearance: She is well-developed.  HENT:     Head: Normocephalic.  Eyes:     Pupils: Pupils are equal,  round, and reactive to light.  Cardiovascular:     Rate and Rhythm: Normal rate and regular rhythm.     Heart sounds: Normal heart sounds.  Pulmonary:     Effort: Pulmonary effort is normal. No respiratory distress.     Breath sounds: Normal breath sounds.  Abdominal:     General: Bowel sounds are normal. There is no distension.     Palpations: Abdomen is soft.     Tenderness: There is no abdominal tenderness.  Skin:    General: Skin is warm and dry.  Neurological:     Mental Status: She is alert and oriented to person, place, and time.  Psychiatric:        Mood and Affect: Mood normal.        Behavior: Behavior normal.        Thought Content: Thought content normal.        Judgment: Judgment normal.     Fetal Tracing:  Baseline: 135 Variability: moderate Accels: 10x10 Decels: none   Toco: none   MAU Course  Procedures  MDM Labs ordered and reviewed.    Discussed monitoring for 4 hours post fall to ensure no uterine activity. Patient agreeable to plan of care.  Flexeril PO-complete resolution of pain  NST reassuring for gestational age four hours post fall. No uterine activity and no abdominal pain  Assessment and Plan   1. Fall, initial encounter   2. Back pain affecting pregnancy in third trimester   3. [redacted] weeks gestation of pregnancy    -Discharge home in stable condition -Rx for flexeril sent to patient's pharmacy -Preterm labor precautions discussed -Patient advised to follow-up with OB as scheduled for prenatal care -Patient may return to MAU as needed or if her condition were to change or worsen  Wende Mott, CNM 09/11/2021, 8:57 PM

## 2021-09-11 NOTE — Discharge Instructions (Signed)

## 2021-09-11 NOTE — MAU Note (Signed)
Pt says at 715pm- she was cleaning child's room- was walking on ABC mats- and slipped and feel on right side - landed on hardwood floor  Has felt baby move Craig lower back and right hip hurts

## 2021-09-20 ENCOUNTER — Encounter: Payer: Self-pay | Admitting: Obstetrics & Gynecology

## 2021-09-20 ENCOUNTER — Ambulatory Visit (INDEPENDENT_AMBULATORY_CARE_PROVIDER_SITE_OTHER): Payer: Medicaid Other | Admitting: Obstetrics & Gynecology

## 2021-09-20 ENCOUNTER — Other Ambulatory Visit: Payer: Medicaid Other

## 2021-09-20 VITALS — BP 120/80 | Wt 285.0 lb

## 2021-09-20 DIAGNOSIS — Z23 Encounter for immunization: Secondary | ICD-10-CM

## 2021-09-20 DIAGNOSIS — Z131 Encounter for screening for diabetes mellitus: Secondary | ICD-10-CM

## 2021-09-20 DIAGNOSIS — Z113 Encounter for screening for infections with a predominantly sexual mode of transmission: Secondary | ICD-10-CM

## 2021-09-20 DIAGNOSIS — Z369 Encounter for antenatal screening, unspecified: Secondary | ICD-10-CM

## 2021-09-20 DIAGNOSIS — Z3A28 28 weeks gestation of pregnancy: Secondary | ICD-10-CM

## 2021-09-20 LAB — POCT URINALYSIS DIPSTICK OB
Glucose, UA: NEGATIVE
POC,PROTEIN,UA: NEGATIVE

## 2021-09-20 NOTE — Progress Notes (Signed)
ROB- 1 hr GTT, TDAP and BT consent signed today

## 2021-09-20 NOTE — Progress Notes (Signed)
Subjective:    Heather Durham is a 26 y.o. female being seen today for her obstetrical visit. She is at 57w2dgestation. Patient reports no bleeding, no contractions, no cramping, and no leaking. Fetal movement: normal.  Menstrual History: OB History     Gravida  2   Para  1   Term  1   Preterm  0   AB  0   Living  1      SAB  0   IAB  0   Ectopic  0   Multiple  0   Live Births  1           Menarche age: [age]Patient's last menstrual period was 03/06/2021.    The following portions of the patient's history were reviewed and updated as appropriate: allergies, current medications, past family history, past medical history, past social history, past surgical history, and problem list.  Review of Systems A comprehensive review of systems was negative.   Objective:    BP 120/80   Wt 285 lb (129.3 kg)   LMP 03/06/2021   BMI 42.09 kg/m  FHT:  139 BPM  Uterine Size: 30 cm  Presentation: cephalic     Assessment:    Pregnancy 28 and 2/7 weeks   Plan:    28-week labs reviewed, drawn, Tdap given,  PTL precautions Follow up in 2 Weeks.   Scheduled for a growth scan in approx 2 weeks

## 2021-09-21 LAB — 28 WEEK RH+PANEL
Basophils Absolute: 0 10*3/uL (ref 0.0–0.2)
Basos: 0 %
EOS (ABSOLUTE): 0.1 10*3/uL (ref 0.0–0.4)
Eos: 1 %
Gestational Diabetes Screen: 94 mg/dL (ref 70–139)
HIV Screen 4th Generation wRfx: NONREACTIVE
Hematocrit: 31.8 % — ABNORMAL LOW (ref 34.0–46.6)
Hemoglobin: 10.7 g/dL — ABNORMAL LOW (ref 11.1–15.9)
Immature Grans (Abs): 0.1 10*3/uL (ref 0.0–0.1)
Immature Granulocytes: 1 %
Lymphocytes Absolute: 2.1 10*3/uL (ref 0.7–3.1)
Lymphs: 20 %
MCH: 29.5 pg (ref 26.6–33.0)
MCHC: 33.6 g/dL (ref 31.5–35.7)
MCV: 88 fL (ref 79–97)
Monocytes Absolute: 0.7 10*3/uL (ref 0.1–0.9)
Monocytes: 6 %
Neutrophils Absolute: 7.7 10*3/uL — ABNORMAL HIGH (ref 1.4–7.0)
Neutrophils: 72 %
Platelets: 252 10*3/uL (ref 150–450)
RBC: 3.63 x10E6/uL — ABNORMAL LOW (ref 3.77–5.28)
RDW: 12.5 % (ref 11.7–15.4)
RPR Ser Ql: NONREACTIVE
WBC: 10.7 10*3/uL (ref 3.4–10.8)

## 2021-09-26 ENCOUNTER — Encounter: Payer: Self-pay | Admitting: Obstetrics & Gynecology

## 2021-09-26 ENCOUNTER — Other Ambulatory Visit: Payer: Self-pay

## 2021-09-26 DIAGNOSIS — O99213 Obesity complicating pregnancy, third trimester: Secondary | ICD-10-CM

## 2021-09-28 ENCOUNTER — Ambulatory Visit: Payer: Medicaid Other | Attending: Obstetrics

## 2021-09-28 ENCOUNTER — Other Ambulatory Visit: Payer: Self-pay

## 2021-09-28 VITALS — BP 120/81 | HR 100 | Ht 69.0 in | Wt 286.5 lb

## 2021-09-28 DIAGNOSIS — E669 Obesity, unspecified: Secondary | ICD-10-CM | POA: Diagnosis not present

## 2021-09-28 DIAGNOSIS — O0990 Supervision of high risk pregnancy, unspecified, unspecified trimester: Secondary | ICD-10-CM

## 2021-09-28 DIAGNOSIS — Z3A29 29 weeks gestation of pregnancy: Secondary | ICD-10-CM | POA: Insufficient documentation

## 2021-09-28 DIAGNOSIS — O99213 Obesity complicating pregnancy, third trimester: Secondary | ICD-10-CM | POA: Insufficient documentation

## 2021-09-28 DIAGNOSIS — O288 Other abnormal findings on antenatal screening of mother: Secondary | ICD-10-CM | POA: Diagnosis not present

## 2021-09-28 DIAGNOSIS — O3663X0 Maternal care for excessive fetal growth, third trimester, not applicable or unspecified: Secondary | ICD-10-CM | POA: Diagnosis not present

## 2021-10-04 ENCOUNTER — Encounter: Payer: Self-pay | Admitting: Obstetrics & Gynecology

## 2021-10-04 ENCOUNTER — Ambulatory Visit (INDEPENDENT_AMBULATORY_CARE_PROVIDER_SITE_OTHER): Payer: Medicaid Other | Admitting: Obstetrics & Gynecology

## 2021-10-04 VITALS — BP 122/80 | Wt 284.0 lb

## 2021-10-04 DIAGNOSIS — Z3480 Encounter for supervision of other normal pregnancy, unspecified trimester: Secondary | ICD-10-CM

## 2021-10-04 NOTE — Progress Notes (Signed)
No vb. No lof.  

## 2021-10-04 NOTE — Progress Notes (Signed)
Subjective:    Heather Durham is a 26 y.o. G2 P63 female being seen today for her obstetrical visit. She is at 19w2dgestation. Patient reports no bleeding, no contractions, no cramping, and no leaking. Fetal movement: normal.  Menstrual History: OB History     Gravida  2   Para  1   Term  1   Preterm  0   AB  0   Living  1      SAB  0   IAB  0   Ectopic  0   Multiple  0   Live Births  1           Patient's last menstrual period was 03/06/2021.    The following portions of the patient's history were reviewed and updated as appropriate: allergies, current medications, past family history, past medical history, past social history, past surgical history, and problem list.  Review of Systems A comprehensive review of systems was negative.   Objective:    BP 122/80   Wt 284 lb (128.8 kg)   LMP 03/06/2021   BMI 41.94 kg/m  FHT:  142 BPM  Uterine Size: 32 cm  Presentation: breech     Assessment:    Pregnancy 30 and 2/7 weeks   Plan:    28-week labs reviewed, normal Labor precautions given  and PTL precautions given Follow up in 2 Weeks.

## 2021-10-18 ENCOUNTER — Ambulatory Visit (INDEPENDENT_AMBULATORY_CARE_PROVIDER_SITE_OTHER): Payer: Medicaid Other | Admitting: Certified Nurse Midwife

## 2021-10-18 ENCOUNTER — Encounter: Payer: Medicaid Other | Admitting: Obstetrics & Gynecology

## 2021-10-18 VITALS — BP 127/83 | HR 106 | Wt 287.0 lb

## 2021-10-18 DIAGNOSIS — Z3A32 32 weeks gestation of pregnancy: Secondary | ICD-10-CM

## 2021-10-18 DIAGNOSIS — Z3483 Encounter for supervision of other normal pregnancy, third trimester: Secondary | ICD-10-CM

## 2021-10-18 NOTE — Progress Notes (Signed)
ROB doing well, feels good movement. C/o some back and hip pain. Reviewed round ligament pain , self help measures. She is also having some  Mild cramping 1-2 time a day. Discussed braxton hicks vx PTL. She verbalizes and agrees. She has follow up MFM u/s on 10/19 for growth. Pt encouraged to keep appointment. ROB in 2 wks.   Philip Aspen, CNM

## 2021-10-18 NOTE — Patient Instructions (Signed)
Round Ligament Pain  The round ligaments are a pair of cord-like tissues that help support the uterus. They can become a source of pain during pregnancy as the ligaments soften and stretch as the baby grows. The pain usually begins in the second trimester (13-28 weeks) of pregnancy, and should only last for a few seconds when it occurs. However, the pain can come and go until the baby is delivered. The pain does not cause harm to the baby. Round ligament pain is usually a short, sharp, and pinching pain, but it can also be a dull, lingering, and aching pain. The pain is felt in the lower side of the abdomen or in the groin. It usually starts deep in the groin and moves up to the outside of the hip area. The pain may happen when you: Suddenly change position, such as quickly going from a sitting to standing position. Do physical activity. Cough or sneeze. Follow these instructions at home: Managing pain  When the pain starts, relax. Then, try any of these methods to help with the pain: Sit down. Flex your knees up to your abdomen. Lie on your side with one pillow under your abdomen and another pillow between your legs. Sit in a warm bath for 15-20 minutes or until the pain goes away. General instructions Watch your condition for any changes. Move slowly when you sit down or stand up. Stop or reduce your physical activities if they cause pain. Avoid long walks if they cause pain. Take over-the-counter and prescription medicines only as told by your health care provider. Keep all follow-up visits. This is important. Contact a health care provider if: Your pain does not go away with treatment. You feel pain in your back that you did not have before. Your medicine is not helping. You have a fever or chills. You have nausea or vomiting. You have diarrhea. You have pain when you urinate. Get help right away if: You have pain that is a rhythmic, cramping pain similar to labor pains. Labor  pains are usually 2 minutes apart, last for about 1 minute, and involve a bearing down feeling or pressure in your pelvis. You have vaginal bleeding. These symptoms may represent a serious problem that is an emergency. Do not wait to see if the symptoms will go away. Get medical help right away. Call your local emergency services (911 in the [place] Do not drive yourself to the hospital. Summary Round ligament pain is felt in the lower abdomen or groin. This pain usually begins in the second trimester (13-28 weeks) and should only last for a few seconds when it occurs. You may notice the pain when you suddenly change position, when you cough or sneeze, or during physical activity. Relaxing, flexing your knees to your abdomen, lying on one side, or taking a warm bath may help to get rid of the pain. Contact your health care provider if the pain does not go away. This information is not intended to replace advice given to you by your health care provider. Make sure you discuss any questions you have with your health care provider. Document Revised: 03/16/2020 Document Reviewed: 03/16/2020 Elsevier Patient Education  2023 [employer]vier Inc.  

## 2021-10-31 ENCOUNTER — Other Ambulatory Visit: Payer: Self-pay

## 2021-10-31 DIAGNOSIS — O99213 Obesity complicating pregnancy, third trimester: Secondary | ICD-10-CM

## 2021-11-02 ENCOUNTER — Other Ambulatory Visit: Payer: Self-pay

## 2021-11-02 ENCOUNTER — Ambulatory Visit (INDEPENDENT_AMBULATORY_CARE_PROVIDER_SITE_OTHER): Payer: Medicaid Other | Admitting: Obstetrics

## 2021-11-02 ENCOUNTER — Encounter: Payer: Self-pay | Admitting: Obstetrics

## 2021-11-02 ENCOUNTER — Ambulatory Visit: Payer: Medicaid Other | Attending: Obstetrics

## 2021-11-02 VITALS — BP 125/82 | HR 105 | Temp 97.7°F | Ht 68.0 in | Wt 291.5 lb

## 2021-11-02 DIAGNOSIS — E669 Obesity, unspecified: Secondary | ICD-10-CM | POA: Diagnosis not present

## 2021-11-02 DIAGNOSIS — O3660X0 Maternal care for excessive fetal growth, unspecified trimester, not applicable or unspecified: Secondary | ICD-10-CM

## 2021-11-02 DIAGNOSIS — O99213 Obesity complicating pregnancy, third trimester: Secondary | ICD-10-CM

## 2021-11-02 DIAGNOSIS — Z23 Encounter for immunization: Secondary | ICD-10-CM | POA: Diagnosis not present

## 2021-11-02 DIAGNOSIS — D1800 Hemangioma unspecified site: Secondary | ICD-10-CM

## 2021-11-02 DIAGNOSIS — Z3483 Encounter for supervision of other normal pregnancy, third trimester: Secondary | ICD-10-CM

## 2021-11-02 DIAGNOSIS — O0990 Supervision of high risk pregnancy, unspecified, unspecified trimester: Secondary | ICD-10-CM

## 2021-11-02 DIAGNOSIS — Z3A34 34 weeks gestation of pregnancy: Secondary | ICD-10-CM

## 2021-11-02 LAB — POCT URINALYSIS DIPSTICK OB
Bilirubin, UA: NEGATIVE
Blood, UA: NEGATIVE
Glucose, UA: NEGATIVE
Ketones, UA: NEGATIVE
Nitrite, UA: NEGATIVE
POC,PROTEIN,UA: NEGATIVE
Spec Grav, UA: 1.025 (ref 1.010–1.025)
Urobilinogen, UA: 0.2 E.U./dL
pH, UA: 7 (ref 5.0–8.0)

## 2021-11-02 NOTE — Progress Notes (Signed)
ROB at [redacted]w[redacted]d Good fetal movement. Heather Durham is having some cramping/BH but no regular contractions. Denies vaginal bleeding and LOF. She is feeling overwhelmed with the discomforts of pregnancy this time and is ready for birth. Interested in prenatal massage. Encouraged abdominal support belt. Discussed sleep hygiene, natural remedies for rest, Tylenol PM PRN. Growth scan this afternoon. Reviewed previous birth; easily birthed a 9-lb baby. Discussed GBS and GC/chlamyida swabs at net visit. RTC in 2 weeks.  MLloyd Huger CNM

## 2021-11-09 ENCOUNTER — Other Ambulatory Visit: Payer: Self-pay | Admitting: Obstetrics

## 2021-11-09 ENCOUNTER — Ambulatory Visit: Payer: Medicaid Other | Attending: Obstetrics and Gynecology

## 2021-11-09 ENCOUNTER — Ambulatory Visit: Payer: Medicaid Other

## 2021-11-09 ENCOUNTER — Other Ambulatory Visit: Payer: Self-pay

## 2021-11-09 VITALS — BP 124/83 | HR 100 | Temp 98.1°F | Ht 69.0 in | Wt 292.0 lb

## 2021-11-09 DIAGNOSIS — E669 Obesity, unspecified: Secondary | ICD-10-CM | POA: Diagnosis not present

## 2021-11-09 DIAGNOSIS — D1800 Hemangioma unspecified site: Secondary | ICD-10-CM | POA: Diagnosis not present

## 2021-11-09 DIAGNOSIS — O3663X0 Maternal care for excessive fetal growth, third trimester, not applicable or unspecified: Secondary | ICD-10-CM | POA: Diagnosis not present

## 2021-11-09 DIAGNOSIS — Z362 Encounter for other antenatal screening follow-up: Secondary | ICD-10-CM

## 2021-11-09 DIAGNOSIS — O99213 Obesity complicating pregnancy, third trimester: Secondary | ICD-10-CM | POA: Diagnosis not present

## 2021-11-09 DIAGNOSIS — O3660X0 Maternal care for excessive fetal growth, unspecified trimester, not applicable or unspecified: Secondary | ICD-10-CM

## 2021-11-09 DIAGNOSIS — Z3A35 35 weeks gestation of pregnancy: Secondary | ICD-10-CM | POA: Diagnosis not present

## 2021-11-09 DIAGNOSIS — O0990 Supervision of high risk pregnancy, unspecified, unspecified trimester: Secondary | ICD-10-CM

## 2021-11-09 NOTE — Procedures (Signed)
Heather Durham 05/04/1995 [redacted]w[redacted]d Fetus A Non-Stress Test Interpretation for 11/09/21  Indication:  Obesity  Fetal Heart Rate A Mode: External Baseline Rate (A): 130 bpm Variability: Moderate Accelerations: 15 x 15 Decelerations: None Multiple birth?: No  Uterine Activity Mode: Toco  Interpretation (Fetal Testing) Nonstress Test Interpretation: Reactive (Dr. SDonalee Citrininterpreted pt NST)

## 2021-11-13 ENCOUNTER — Encounter (INDEPENDENT_AMBULATORY_CARE_PROVIDER_SITE_OTHER): Payer: Self-pay

## 2021-11-14 ENCOUNTER — Other Ambulatory Visit: Payer: Self-pay

## 2021-11-14 DIAGNOSIS — O99212 Obesity complicating pregnancy, second trimester: Secondary | ICD-10-CM

## 2021-11-14 DIAGNOSIS — O99213 Obesity complicating pregnancy, third trimester: Secondary | ICD-10-CM

## 2021-11-16 ENCOUNTER — Ambulatory Visit: Payer: Medicaid Other | Attending: Maternal & Fetal Medicine

## 2021-11-16 ENCOUNTER — Other Ambulatory Visit: Payer: Self-pay

## 2021-11-16 VITALS — BP 130/79 | HR 105 | Temp 97.6°F | Wt 290.0 lb

## 2021-11-16 DIAGNOSIS — O3663X0 Maternal care for excessive fetal growth, third trimester, not applicable or unspecified: Secondary | ICD-10-CM | POA: Diagnosis not present

## 2021-11-16 DIAGNOSIS — O0990 Supervision of high risk pregnancy, unspecified, unspecified trimester: Secondary | ICD-10-CM

## 2021-11-16 DIAGNOSIS — Z3A36 36 weeks gestation of pregnancy: Secondary | ICD-10-CM | POA: Diagnosis not present

## 2021-11-16 DIAGNOSIS — O99213 Obesity complicating pregnancy, third trimester: Secondary | ICD-10-CM | POA: Insufficient documentation

## 2021-11-16 DIAGNOSIS — E669 Obesity, unspecified: Secondary | ICD-10-CM | POA: Diagnosis not present

## 2021-11-16 DIAGNOSIS — O99212 Obesity complicating pregnancy, second trimester: Secondary | ICD-10-CM

## 2021-11-17 ENCOUNTER — Ambulatory Visit (INDEPENDENT_AMBULATORY_CARE_PROVIDER_SITE_OTHER): Payer: Medicaid Other | Admitting: Obstetrics

## 2021-11-17 ENCOUNTER — Other Ambulatory Visit (HOSPITAL_COMMUNITY)
Admission: RE | Admit: 2021-11-17 | Discharge: 2021-11-17 | Disposition: A | Discharge status: Home / Self Care | Payer: Medicaid Other | Source: Ambulatory Visit | Attending: Obstetrics | Admitting: Obstetrics

## 2021-11-17 VITALS — BP 122/84 | Wt 295.0 lb

## 2021-11-17 DIAGNOSIS — Z113 Encounter for screening for infections with a predominantly sexual mode of transmission: Secondary | ICD-10-CM

## 2021-11-17 DIAGNOSIS — E669 Obesity, unspecified: Secondary | ICD-10-CM

## 2021-11-17 DIAGNOSIS — Z3A36 36 weeks gestation of pregnancy: Secondary | ICD-10-CM

## 2021-11-17 DIAGNOSIS — Z3483 Encounter for supervision of other normal pregnancy, third trimester: Secondary | ICD-10-CM | POA: Insufficient documentation

## 2021-11-17 DIAGNOSIS — O99213 Obesity complicating pregnancy, third trimester: Secondary | ICD-10-CM

## 2021-11-17 LAB — POCT URINALYSIS DIPSTICK
Bilirubin, UA: NEGATIVE
Blood, UA: NEGATIVE
Glucose, UA: NEGATIVE
Ketones, UA: NEGATIVE
Leukocytes, UA: NEGATIVE
Nitrite, UA: NEGATIVE
Protein, UA: NEGATIVE
Spec Grav, UA: 1.015 (ref 1.010–1.025)
Urobilinogen, UA: 1 E.U./dL
pH, UA: 6.5 (ref 5.0–8.0)

## 2021-11-17 NOTE — Addendum Note (Signed)
Addended by: Inis Sizer on: 11/17/2021 10:17 AM   Modules accepted: Orders

## 2021-11-17 NOTE — Progress Notes (Signed)
Routine Prenatal Care Visit  Subjective  Heather Durham is a 26 y.o. G2P1001 at 57w4dbeing seen today for ongoing prenatal care.  She is currently monitored for the following issues for this high-risk pregnancy and has Pica in adults; Multiple hemangiomas; Gonorrhea; BV (bacterial vaginosis); Supervision of high risk pregnancy, antepartum; and Obesity affecting pregnancy on their problem list.  ----------------------------------------------------------------------------------- Patient reports no complaints.  She had a BPP yesterday,a dneverything went fine per her report. She is uncomfortable, and would welcome an IOL at 39 wks (high BMI, Hx of Macrosomiv baby)  .  .   . Leaking Fluid denies.  ----------------------------------------------------------------------------------- The following portions of the patient's history were reviewed and updated as appropriate: allergies, current medications, past family history, past medical history, past social history, past surgical history and problem list. Problem list updated.  Objective  Blood pressure 122/84, weight 295 lb (133.8 kg), last menstrual period 03/06/2021, unknown if currently breastfeeding. Pregravid weight 255 lb (115.7 kg) Total Weight Gain 40 lb (18.1 kg) Urinalysis: Urine Protein    Urine Glucose    Fetal Status:           General:  Alert, oriented and cooperative. Patient is in no acute distress.  Skin: Skin is warm and dry. No rash noted.   Cardiovascular: Normal heart rate noted  Respiratory: Normal respiratory effort, no problems with respiration noted  Abdomen: Soft, gravid, appropriate for gestational age.       Pelvic:   Examined. Cervix is anterior, closed, long and the baby is ballotable, but vertex.         Extremities: Normal range of motion.     Mental Status: Normal mood and affect. Normal behavior. Normal judgment and thought content.   Assessment   26y.o. G2P1001 at 393w4dy  12/11/2021, by Last Menstrual  Period presenting for routine prenatal visit  Plan   pregnancy 2  Problems (from 05/02/21 to present)     Problem Noted Resolved   Obesity affecting pregnancy 05/29/2021 by GlRod CanCNM No   Supervision of high risk pregnancy, antepartum 05/02/2021 by FrImagene RichesCNM No   Overview Addendum 06/26/2021  4:26 PM by FrImagene RichesCNM     Nursing Staff Provider  Office Location  [facility] Dating  EDD by LMP c/w 12w u/s  Language  English Anatomy [place]  Flu Vaccine   Genetic Screen  NIPS:   TDaP vaccine    Hgb A1C or  GTT Early : Third trimester :   Covid    LAB RESULTS   Rhogam   Blood Type O/Positive/-- (05/15 0848)   Feeding Plan  Antibody Negative (05/15 0848)  Contraception  Rubella 2.75 (05/15 0848)  Circumcision  RPR Non Reactive (05/15 0848)   Pediatrician   HBsAg Negative (05/15 0848)   Support Person  HIV Non Reactive (05/15 0848)  Prenatal Classes  Varicella     GBS  (For PCN allergy, check sensitivities)   BTL Consent     VBAC Consent  Pap  NILM    Hgb Electro      CF      SMA                   Preterm labor symptoms and general obstetric precautions including but not limited to vaginal bleeding, contractions, leaking of fluid and fetal movement were reviewed in detail with the patient. Please refer to After Visit Summary for other counseling recommendations.  Her GBS and  STI screen completed today.  Return in about 1 week (around 11/24/2021) for return OB. Will look to set her up for an IOL at 39 weeks.  Imagene Riches, CNM  11/17/2021 10:09 AM

## 2021-11-20 LAB — CERVICOVAGINAL ANCILLARY ONLY
Bacterial Vaginitis (gardnerella): POSITIVE — AB
Chlamydia: NEGATIVE
Comment: NEGATIVE
Comment: NEGATIVE
Comment: NEGATIVE
Comment: NORMAL
Neisseria Gonorrhea: NEGATIVE
Trichomonas: NEGATIVE

## 2021-11-21 LAB — CULTURE, BETA STREP (GROUP B ONLY): Strep Gp B Culture: NEGATIVE

## 2021-11-23 ENCOUNTER — Other Ambulatory Visit: Payer: Self-pay

## 2021-11-23 ENCOUNTER — Observation Stay
Admission: RE | Admit: 2021-11-23 | Discharge: 2021-11-23 | Disposition: A | Discharge status: Home / Self Care | Payer: Medicaid Other | Attending: Certified Nurse Midwife | Admitting: Certified Nurse Midwife

## 2021-11-23 ENCOUNTER — Ambulatory Visit (HOSPITAL_BASED_OUTPATIENT_CLINIC_OR_DEPARTMENT_OTHER): Payer: Medicaid Other

## 2021-11-23 ENCOUNTER — Encounter: Payer: Self-pay | Admitting: Certified Nurse Midwife

## 2021-11-23 ENCOUNTER — Other Ambulatory Visit: Payer: Self-pay | Admitting: Obstetrics and Gynecology

## 2021-11-23 ENCOUNTER — Ambulatory Visit: Payer: Medicaid Other

## 2021-11-23 VITALS — BP 124/79 | HR 108 | Temp 97.6°F | Ht 69.0 in | Wt 295.0 lb

## 2021-11-23 DIAGNOSIS — O99213 Obesity complicating pregnancy, third trimester: Secondary | ICD-10-CM

## 2021-11-23 DIAGNOSIS — O368330 Maternal care for abnormalities of the fetal heart rate or rhythm, third trimester, not applicable or unspecified: Secondary | ICD-10-CM | POA: Diagnosis present

## 2021-11-23 DIAGNOSIS — Z3A37 37 weeks gestation of pregnancy: Secondary | ICD-10-CM | POA: Insufficient documentation

## 2021-11-23 DIAGNOSIS — O3663X0 Maternal care for excessive fetal growth, third trimester, not applicable or unspecified: Secondary | ICD-10-CM | POA: Insufficient documentation

## 2021-11-23 DIAGNOSIS — E669 Obesity, unspecified: Secondary | ICD-10-CM | POA: Insufficient documentation

## 2021-11-23 DIAGNOSIS — O4103X0 Oligohydramnios, third trimester, not applicable or unspecified: Secondary | ICD-10-CM | POA: Insufficient documentation

## 2021-11-23 DIAGNOSIS — O0990 Supervision of high risk pregnancy, unspecified, unspecified trimester: Secondary | ICD-10-CM

## 2021-11-23 MED ORDER — LACTATED RINGERS IV SOLN: INTRAVENOUS | Status: DC

## 2021-11-23 MED ORDER — LACTATED RINGERS IV SOLN
500.0000 mL | INTRAVENOUS | Status: DC | PRN
  Administered 2021-11-23: 1000 mL via INTRAVENOUS

## 2021-11-23 NOTE — Progress Notes (Signed)
Discharge instructions provided to patient. Patient verbalized understanding. Pt educated on signs and symptoms of labor, vaginal bleeding, LOF, and fetal movement. Red flag signs reviewed by RN. Patient instructed to return to Labor and Delivery for a recheck of her AFI on 11/25/21 at 1000. Patient discharged home with significant other in stable condition.

## 2021-11-23 NOTE — OB Triage Note (Signed)
    L&D OB Triage Note  SUBJECTIVE Heather Durham is a 26 y.o. G58P1001 female at [redacted]w[redacted]d EDD Estimated Date of Delivery: 12/11/21 who presented to after being seen by MFM.  It was noted on u/s today at MFM appointment that pt has oligohydramnios. Recommendation from Dr. BGertie Exon IV fluid hydration. Repeat u/s in 48 hours.   OB History  Gravida Para Term Preterm AB Living  '2 1 1 '$ 0 0 1  SAB IAB Ectopic Multiple Live Births  0 0 0 0 1    # Outcome Date GA Lbr Len/2nd Weight Sex Delivery Anes PTL Lv  2 Current           1 Term 05/15/17 459w1d2:49 / 00:27 4110 g F Vag-Spont EPI  LIV     Name: Heather Durham     Apgar1: 6  Apgar5: 9    Medications Prior to Admission  Medication Sig Dispense Refill Last Dose   Prenatal Vit-Fe Fumarate-FA (PRENATAL MULTIVITAMIN) TABS tablet Take 1 tablet by mouth daily at 12 noon.   11/23/2021     OBJECTIVE  Nursing Evaluation:   BP 132/69 (BP Location: Left Arm)   Pulse (!) 103   Temp 98.3 F (36.8 C) (Oral)   Resp 16   Ht '5\' 9"'$  (1.753 m)   Wt 134 kg   LMP 03/06/2021   BMI 43.63 kg/m    Findings:        Reactive NST      NST was performed and has been reviewed by me.  NST INTERPRETATION: Category I  Mode: External Baseline Rate (A): 130 bpm Variability: Moderate Accelerations: 15 x 15 Decelerations: None     Contraction Frequency (min): one noted  ASSESSMENT Impression:  1.  Pregnancy:  G2P1001 at 3785w3dEDD Estimated Date of Delivery: 12/11/21 2.  Reassuring fetal and maternal status 3.  IV fluid hydration.  PLAN 1. Current condition and above findings reviewed.  Reassuring fetal and maternal condition. 2. Discharge home with standard labor precautions given to return to L&D or call the office for problems. 3.Return on Saturday for NST and repeat u/s for AFI. Dr. CheMarcelline Matestified and she is in agreement with plan of care.   AnnPhilip AspenNM

## 2021-11-23 NOTE — OB Triage Note (Signed)
Patient is a 26 yo, G2P1, at 37 weeks 3 days. Patient presents to labor and delivery from maternal fetal medicine.  Patient denies any vaginal bleeding or LOF. Patient reports +FM. Monitors applied and assessing. VSS. Initial fetal heart tone 150.Grandville Silos, CNM notified of patients arrival to unit. Plan to place in observation for a NST and IV hydration with LR per CNM verbal order.

## 2021-11-23 NOTE — Progress Notes (Signed)
Pt had NST and Ultrasound at the MFM clinic today.  Transported to [facility] after appt via wheelchair with Sig other.  Dr. Gertie Exon spoke with Philip Aspen about patient POC prior to transport to O[facility]3.

## 2021-11-23 NOTE — Procedures (Signed)
Heather Durham 07/18/1995 [redacted]w[redacted]d Fetus A Non-Stress Test Interpretation for 11/23/21  Indication:  Obesity  Fetal Heart Rate A Mode: External Baseline Rate (A): 150 bpm Variability: Moderate Accelerations: 15 x 15 Decelerations: None Multiple birth?: No  Uterine Activity Mode: Toco  Interpretation (Fetal Testing) Nonstress Test Interpretation: Reactive (Dr. BGertie Exonreviewed NST)

## 2021-11-23 NOTE — Discharge Instructions (Signed)
Be sure to drink plenty of water and stay hydrated the best you can!

## 2021-11-24 ENCOUNTER — Other Ambulatory Visit: Payer: Self-pay | Admitting: Obstetrics

## 2021-11-24 ENCOUNTER — Encounter: Payer: Self-pay | Admitting: Obstetrics

## 2021-11-24 MED ORDER — METRONIDAZOLE 500 MG PO TABS: 500.0000 mg | ORAL_TABLET | Freq: Two times a day (BID) | ORAL | 0 refills | Status: DC

## 2021-11-25 ENCOUNTER — Other Ambulatory Visit: Payer: Self-pay

## 2021-11-25 ENCOUNTER — Inpatient Hospital Stay: Payer: Medicaid Other

## 2021-11-25 ENCOUNTER — Inpatient Hospital Stay
Admission: RE | Admit: 2021-11-25 | Discharge: 2021-11-28 | DRG: 807 | Disposition: A | Discharge status: Home / Self Care | Payer: Medicaid Other | Source: Ambulatory Visit | Attending: Obstetrics | Admitting: Obstetrics

## 2021-11-25 ENCOUNTER — Encounter: Payer: Self-pay | Admitting: Obstetrics and Gynecology

## 2021-11-25 DIAGNOSIS — O99214 Obesity complicating childbirth: Secondary | ICD-10-CM | POA: Diagnosis present

## 2021-11-25 DIAGNOSIS — O4100X0 Oligohydramnios, unspecified trimester, not applicable or unspecified: Secondary | ICD-10-CM | POA: Diagnosis present

## 2021-11-25 DIAGNOSIS — O99213 Obesity complicating pregnancy, third trimester: Secondary | ICD-10-CM

## 2021-11-25 DIAGNOSIS — Z3A38 38 weeks gestation of pregnancy: Secondary | ICD-10-CM

## 2021-11-25 DIAGNOSIS — O4103X0 Oligohydramnios, third trimester, not applicable or unspecified: Secondary | ICD-10-CM | POA: Diagnosis present

## 2021-11-25 DIAGNOSIS — O0990 Supervision of high risk pregnancy, unspecified, unspecified trimester: Secondary | ICD-10-CM

## 2021-11-25 DIAGNOSIS — E669 Obesity, unspecified: Secondary | ICD-10-CM | POA: Diagnosis not present

## 2021-11-25 LAB — CBC
HCT: 35.1 % — ABNORMAL LOW (ref 36.0–46.0)
Hemoglobin: 11.1 g/dL — ABNORMAL LOW (ref 12.0–15.0)
MCH: 27.1 pg (ref 26.0–34.0)
MCHC: 31.6 g/dL (ref 30.0–36.0)
MCV: 85.8 fL (ref 80.0–100.0)
Platelets: 224 10*3/uL (ref 150–400)
RBC: 4.09 MIL/uL (ref 3.87–5.11)
RDW: 14.5 % (ref 11.5–15.5)
WBC: 10.2 10*3/uL (ref 4.0–10.5)
nRBC: 0 % (ref 0.0–0.2)

## 2021-11-25 LAB — TYPE AND SCREEN
ABO/RH(D): O POS
Antibody Screen: NEGATIVE

## 2021-11-25 MED ORDER — MISOPROSTOL 25 MCG QUARTER TABLET
25.0000 ug | ORAL_TABLET | Freq: Once | ORAL | Status: AC
  Filled 2021-11-25: qty 1

## 2021-11-25 MED ORDER — TERBUTALINE SULFATE 1 MG/ML IJ SOLN: 0.2500 mg | Freq: Once | INTRAMUSCULAR | Status: DC | PRN

## 2021-11-25 MED ORDER — MISOPROSTOL 25 MCG QUARTER TABLET
ORAL_TABLET | ORAL | Status: AC
  Administered 2021-11-25: 25 ug via VAGINAL
  Filled 2021-11-25: qty 2

## 2021-11-25 MED ORDER — LIDOCAINE HCL (PF) 1 % IJ SOLN: 30.0000 mL | INTRAMUSCULAR | Status: DC | PRN

## 2021-11-25 MED ORDER — LIDOCAINE HCL (PF) 1 % IJ SOLN
INTRAMUSCULAR | Status: AC
  Filled 2021-11-25: qty 30

## 2021-11-25 MED ORDER — SOD CITRATE-CITRIC ACID 500-334 MG/5ML PO SOLN: 30.0000 mL | ORAL | Status: DC | PRN

## 2021-11-25 MED ORDER — MISOPROSTOL 25 MCG QUARTER TABLET
25.0000 ug | ORAL_TABLET | ORAL | Status: DC | PRN
  Administered 2021-11-25 (×2): 25 ug via VAGINAL
  Filled 2021-11-25: qty 1

## 2021-11-25 MED ORDER — OXYTOCIN-SODIUM CHLORIDE 30-0.9 UT/500ML-% IV SOLN
1.0000 m[IU]/min | INTRAVENOUS | Status: DC
  Administered 2021-11-26: 2 m[IU]/min via INTRAVENOUS
  Filled 2021-11-25: qty 500

## 2021-11-25 MED ORDER — MISOPROSTOL 25 MCG QUARTER TABLET
25.0000 ug | ORAL_TABLET | ORAL | Status: DC
  Administered 2021-11-25: 25 ug via ORAL
  Filled 2021-11-25 (×2): qty 1

## 2021-11-25 MED ORDER — MISOPROSTOL 200 MCG PO TABS
ORAL_TABLET | ORAL | Status: AC
  Administered 2021-11-25: 25 ug via ORAL
  Filled 2021-11-25: qty 4

## 2021-11-25 MED ORDER — ONDANSETRON HCL 4 MG/2ML IJ SOLN: 4.0000 mg | Freq: Four times a day (QID) | INTRAMUSCULAR | Status: DC | PRN

## 2021-11-25 MED ORDER — OXYTOCIN BOLUS FROM INFUSION
333.0000 mL | Freq: Once | INTRAVENOUS | Status: AC
  Administered 2021-11-27: 333 mL via INTRAVENOUS

## 2021-11-25 MED ORDER — MISOPROSTOL 25 MCG QUARTER TABLET: 25.0000 ug | ORAL_TABLET | Freq: Once | ORAL | Status: AC

## 2021-11-25 MED ORDER — OXYTOCIN-SODIUM CHLORIDE 30-0.9 UT/500ML-% IV SOLN
INTRAVENOUS | Status: AC
  Filled 2021-11-25: qty 500

## 2021-11-25 MED ORDER — AMMONIA AROMATIC IN INHA
RESPIRATORY_TRACT | Status: AC
  Filled 2021-11-25: qty 10

## 2021-11-25 MED ORDER — SODIUM CHLORIDE (PF) 0.9 % IJ SOLN
INTRAMUSCULAR | Status: AC
  Filled 2021-11-25: qty 50

## 2021-11-25 MED ORDER — OXYTOCIN 10 UNIT/ML IJ SOLN
INTRAMUSCULAR | Status: AC
  Filled 2021-11-25: qty 2

## 2021-11-25 MED ORDER — LACTATED RINGERS IV SOLN: INTRAVENOUS | Status: DC

## 2021-11-25 MED ORDER — MISOPROSTOL 25 MCG QUARTER TABLET
25.0000 ug | ORAL_TABLET | ORAL | Status: DC | PRN
  Administered 2021-11-25: 25 ug via ORAL

## 2021-11-25 MED ORDER — LACTATED RINGERS IV SOLN: 500.0000 mL | INTRAVENOUS | Status: DC | PRN

## 2021-11-25 MED ORDER — OXYTOCIN-SODIUM CHLORIDE 30-0.9 UT/500ML-% IV SOLN
2.5000 [IU]/h | INTRAVENOUS | Status: DC
  Administered 2021-11-27: 2.5 [IU]/h via INTRAVENOUS

## 2021-11-25 MED ORDER — DIPHENHYDRAMINE HCL 25 MG PO CAPS
50.0000 mg | ORAL_CAPSULE | Freq: Four times a day (QID) | ORAL | Status: DC | PRN
  Administered 2021-11-25: 50 mg via ORAL
  Filled 2021-11-25: qty 2

## 2021-11-25 MED ORDER — FENTANYL CITRATE (PF) 100 MCG/2ML IJ SOLN
50.0000 ug | INTRAMUSCULAR | Status: DC | PRN
  Administered 2021-11-26 (×2): 100 ug via INTRAVENOUS
  Filled 2021-11-25 (×2): qty 2

## 2021-11-25 NOTE — Progress Notes (Signed)
   Subjective:  Starting to feel cramping. Open to FB placement. Partner at her side  Objective:   Vitals: Blood pressure 122/76, pulse (!) 104, temperature 98.4 F (36.9 C), temperature source Oral, resp. rate 14, height '5\' 9"'$  (1.753 m), weight 133.4 kg, last menstrual period 03/06/2021, unknown if currently breastfeeding. General: NAD, tearful with FB placement  Abdomen:non tender  Cervical Exam: 2.5/50/-3, vertex   FHT: baseline 125, moderate variability, pos accel, neg decel  Toco:q 1-3  Results for orders placed or performed during the hospital encounter of 11/25/21 (from the past 24 hour(s))  CBC     Status: Abnormal   Collection Time: 11/25/21  1:40 PM  Result Value Ref Range   WBC 10.2 4.0 - 10.5 K/uL   RBC 4.09 3.87 - 5.11 MIL/uL   Hemoglobin 11.1 (L) 12.0 - 15.0 g/dL   HCT 35.1 (L) 36.0 - 46.0 %   MCV 85.8 80.0 - 100.0 fL   MCH 27.1 26.0 - 34.0 pg   MCHC 31.6 30.0 - 36.0 g/dL   RDW 14.5 11.5 - 15.5 %   Platelets 224 150 - 400 K/uL   nRBC 0.0 0.0 - 0.2 %  Type and screen [place]     Status: None   Collection Time: 11/25/21  1:40 PM  Result Value Ref Range   ABO/RH(D) O POS    Antibody Screen NEG    Sample Expiration      11/28/2021,2359 Performed at [facility], [address], [place],  [address]     Assessment:   26 y.o. G2P1001 70w5dadmitted for IOL secondary to oligohydramnios  Plan:   1) Labor -Attempted to place FB but FB moved out of os once filled with normal saline, VE revealed 2.5cm, FB not placed, will repeat cytotec at 1800,AROM and Pitocin when appropriate. Pt tearful during FB placement, reassured pt we can keep exams to a minimum. VE only when needed.   2) Fetus - category 1 tracing   3) GSB negative, Membranes intact   4) Pain management: aware of all options, will ask if desired.   LRoberto Scales [facility]  11/25/2021 5:26 PM

## 2021-11-25 NOTE — Plan of Care (Signed)
  Problem: Education: Goal: Knowledge of Childbirth will improve Outcome: Progressing   Problem: Pain Management: Goal: Relief or control of pain from uterine contractions will improve Outcome: Progressing

## 2021-11-25 NOTE — Progress Notes (Signed)
   Subjective:  Resting quietly, feeling some contractions. Partner at her side.   Objective:   Vitals: Blood pressure 129/86, pulse (!) 101, temperature 99.6 F (37.6 C), temperature source Oral, resp. rate 16, height '5\' 9"'$  (1.753 m), weight 133.4 kg, last menstrual period 03/06/2021, unknown if currently breastfeeding. General: NAD Abdomen:non tender Cervical Exam:  Dilation: 3 Effacement (%): 50 Station: -2 Presentation: Vertex Exam by:: L. Zaylynn Rickett, CNM  FHT: baseline 135, moderate variability, pos accel, neg decel  Toco:q 1-6  Results for orders placed or performed during the hospital encounter of 11/25/21 (from the past 24 hour(s))  CBC     Status: Abnormal   Collection Time: 11/25/21  1:40 PM  Result Value Ref Range   WBC 10.2 4.0 - 10.5 K/uL   RBC 4.09 3.87 - 5.11 MIL/uL   Hemoglobin 11.1 (L) 12.0 - 15.0 g/dL   HCT 35.1 (L) 36.0 - 46.0 %   MCV 85.8 80.0 - 100.0 fL   MCH 27.1 26.0 - 34.0 pg   MCHC 31.6 30.0 - 36.0 g/dL   RDW 14.5 11.5 - 15.5 %   Platelets 224 150 - 400 K/uL   nRBC 0.0 0.0 - 0.2 %  Type and screen [place]     Status: None   Collection Time: 11/25/21  1:40 PM  Result Value Ref Range   ABO/RH(D) O POS    Antibody Screen NEG    Sample Expiration      11/28/2021,2359 Performed at [facility], [address] [place], [place] [address]     Assessment:   26 y.o. G2P1001 25w5dadmitted for IOL secondary to oligohydramnios  Plan:  1) Labor -some cervical change made but cervix remains a little thick, will do cytotec when due then consider Pitocin .   2) Fetus - category 1 tracing    3) GSB negative, Membranes intact    4) Pain management: aware of all options, will ask if desired.   LRoberto Scales [facility]  11/25/2021 10:00 PM

## 2021-11-25 NOTE — H&P (Signed)
OB History & Physical   History of Present Illness:  Chief Complaint:   HPI:  Heather Durham is a 26 y.o. G70P1001 female at 57w5ddated by L and 12wkUS.  She presents to L&D for evaluation secondary to oligohydramnios.  On 11/2 her AFI was objectively normal but subjectively low, on on 11/9 was seen by MFM, her AFI was 3.42, she was then sent to L and D for monitoring and hydration with the plan to return in 48 hours for a repeat AFI.  Today her AFI is 3.0 Discussed findings with [name] and her partner, rec moving forward with induction. [name] in agreement with plan. [name] reports she did not sleep well last d/t being concerned about low fluid, she endorses +FM, has noticed some abdominal tightening denies LOF/VB. [name] has had early and regular prenatal care.   Pregnancy Issues: 1. BMI 43.42 2. Oligohydramnios   Maternal Medical History:   Past Medical History:  Diagnosis Date   Abnormal Pap smear of cervix    Chlamydia    Dysmenorrhea    Trichomonas infection    UTI (urinary tract infection)     Past Surgical History:  Procedure Laterality Date   FOOT SURGERY Right     No Known Allergies  Prior to Admission medications   Medication Sig Start Date End Date Taking? Authorizing Provider  metroNIDAZOLE (FLAGYL) 500 MG tablet Take 1 tablet (500 mg total) by mouth 2 (two) times daily. Patient not taking: Reported on 11/25/2021 11/24/21   SLurlean Horns CNM  Prenatal Vit-Fe Fumarate-FA (PRENATAL MULTIVITAMIN) TABS tablet Take 1 tablet by mouth daily at 12 noon.   Yes [provider]  norethindrone (MICRONOR,CAMILA,ERRIN) 0.35 MG tablet Take 1 tablet (0.35 mg total) by mouth daily. 06/24/17 09/28/21  PDonnamae Jude MD     Prenatal care site:[facility]   Social History: She  reports that she has never smoked. She has never used smokeless tobacco. She reports that she does not currently use alcohol. She reports that she does not use drugs.  Family  History: family history includes Diabetes in her cousin; Fibroids in her mother.   Review of Systems: A full review of systems was performed and negative except as noted in the HPI.     Physical Exam:  Vital Signs: BP 122/76 (BP Location: Left Arm)   Pulse (!) 104   Temp 98.4 F (36.9 C) (Oral)   Resp 14   Ht '5\' 9"'$  (1.753 m)   Wt 133.4 kg Comment: 294lbs  LMP 03/06/2021   BMI 43.42 kg/m  General: no acute distress.  HEENT: normocephalic, atraumatic Heart: regular rate & rhythm.  No murmurs/rubs/gallops Lungs: clear to auscultation bilaterally, normal respiratory effort Abdomen: soft, gravid, non-tender;  EFW: 7lbs Pelvic:   External: Normal external female genitalia  Cervix: Dilation: Fingertip / Effacement (%): 50 / Station: Ballotable (high)    Extremities: non-tender, symmetric, trace edema bilaterally.  Neurologic: Alert & oriented x 3.    No results found for this or any previous visit (from the past 24 hour(s)).  Pertinent Results:  Prenatal Labs: Blood type/Rh O+  Antibody screen neg  Rubella Immune  Varicella Non Immune  RPR NR  HBsAg Neg  HIV NR  GC neg  Chlamydia neg  Genetic screening negative  1 hour GTT 94  3 hour GTT   GBS neg   FHYI:[id number], moderate variability, pos accel, neg decel  TOCO:irregular  SVE:  Dilation: Fingertip / Effacement (%): 50 /  Station: Ballotable (high)    Cephalic by leopolds  US OB Limited  Result Date: 11/25/2021 CLINICAL DATA:  26 year old female with all ago hydramnios. EXAM: LIMITED OBSTETRIC ULTRASOUND COMPARISON:  In the thin Ob ultrasound 11/23/2021. FINDINGS: Number of Fetuses: 1 Heart Rate:  123 bpm Movement: Present Presentation: Cephalic Placental Location: Anterior Previa: Negative Amniotic Fluid (Subjective): Decreased, grossly stable since 11/23/2021. AFI: 3.0 cm (previously 3.42), less than 2.5th percentile for 38 weeks. BPD: 9.2 cm 37 w  1 d MATERNAL FINDINGS: Cervix:  Not visualized. IMPRESSION: 1.  Oligohydramnios, not significantly changed since 11/23/2021 ultrasound. 2. Otherwise viable Singleton fetus with estimated gestational age of [redacted] weeks and 1 day by BPD This exam is performed on an emergent basis and does not comprehensively evaluate fetal size, dating, or anatomy; follow-up complete OB US should be considered if further fetal assessment is warranted. Electronically Signed   By: Genevie Ann M.D.   On: 11/25/2021 12:18   [place] MFM OB LIMITED  Result Date: 11/23/2021 ----------------------------------------------------------------------  OBSTETRICS REPORT                    (Corrected Final 11/23/2021 03:41 pm) ---------------------------------------------------------------------- Patient Info  ID #:       [id number]                          D.O.B.:  [birth year] (26 yrs)  Name:       Heather Durham                Visit Date: 11/23/2021 02:20 pm ---------------------------------------------------------------------- Performed By  Attending:        Sander Nephew      Ref. Address:     [place]                                                             [place]  Performed By:     Rolm Bookbinder RDMS     [facility]                                                             [facility] at  [facility]  Referred By:      Caren Macadam MD ---------------------------------------------------------------------- Orders  #  Description                           Code        Ordered By  1  [place] MFM OB LIMITED                     82423.53    Tama High ----------------------------------------------------------------------  #  Order #                     Accession #                Episode #  1  [id number]                   [id number]                  [id number] ---------------------------------------------------------------------- Indications  Oligohydramnios / Decreased amniotic fluid     O41.00X0  volume  Obesity complicating pregnancy, third          O99.213  trimester (BMI 42)  [redacted] weeks gestation of pregnancy                Z3A.37  Large for gestational age fetus affecting      O63.60X0  management of mother  Unspecified abnormal finding on antenatal      O28.9  screening of mother (hemangiomas) ---------------------------------------------------------------------- Fetal Evaluation  Num Of Fetuses:         1  Fetal Heart Rate(bpm):  132  Cardiac Activity:       Observed  Presentation:           Cephalic  Placenta:               Anterior  Amniotic Fluid  AFI FV:      Oligohydramnios  AFI Sum(cm)     %Tile       Largest Pocket(cm)  3.42            < 3         2.23                RLQ(cm)                      LLQ(cm)                2.23                         1.19 ---------------------------------------------------------------------- OB History  Gravidity:    2  Living:       1 ---------------------------------------------------------------------- Gestational Age  LMP:           37w 3d        Date:  03/06/21                  EDD:   12/11/21  Best:          37w 3d     Det. By:  LMP  (03/06/21)          EDD:   12/11/21 ---------------------------------------------------------------------- Anatomy  Heart:                 Previously  seen        Kidneys:                Appear normal  Stomach:               Appears normal, left   Bladder:                Appears normal                         sided  Other:  All anatomy previously seen ---------------------------------------------------------------------- Impression  Ms. Crespo is being seen for antenatal testing due to  elevated BMI.  Limited exam to assess fetal amniotic fluid volume  Good fetal movement but oligohydramnios is noted.  I discussed with Ms. Egley today's finding of  oligohydramnios. I  reviewed that this finding can be  associated with dehydration vs placental insufficiency. I  discussed that oligohydramnios has been associated with an  increased risk for stillbirth.  I discussed with Silvana Newness CNM, Ms. Hynson desired  to go to triage for fluid hydration and monitoring with a plan  for repeat AFI in 48 hours. ---------------------------------------------------------------------- Recommendations  To triage for hydration and monitoring.  If discharged she is scheduled for antenatal testing at our unit  Tuesday at 1 pm. ----------------------------------------------------------------------                   Sander Nephew, MD Electronically Signed Corrected Final Report  11/23/2021 03:41 pm ----------------------------------------------------------------------  [place] MFM FETAL BPP WO NON STRESS  Result Date: 11/16/2021 ----------------------------------------------------------------------  OBSTETRICS REPORT                       (Signed Final 11/16/2021 03:56 pm) ---------------------------------------------------------------------- Patient Info  ID #:       [id number]                          D.O.B.:  09/27/1995 (26 yrs)  Name:       Heather Durham                Visit Date: 11/16/2021 03:02 pm ---------------------------------------------------------------------- Performed By  Attending:        Sander Nephew      Ref. Address:     [place]  [place]  Performed By:     Rolm Bookbinder RDMS     Location[facility]         [facility]                                                             [facility] at                                                             [facility]  Referred By:      Caren Macadam MD  ---------------------------------------------------------------------- Orders  #  Description                           Code        Ordered By  1  [place] MFM FETAL BPP WO NON               76819.01    CORENTHIAN     STRESS                                            BOOKER ----------------------------------------------------------------------  #  Order #                     Accession #                Episode #  1  [id number]                   [id number]                 [id number] ---------------------------------------------------------------------- Indications  [redacted] weeks gestation of pregnancy                O7M.78  Obesity complicating pregnancy, third          O99.213  trimester (BMI 36)  Large for gestational age fetus affecting      O81.60X0  management of mother  Unspecified abnormal finding on antenatal      O28.9  screening of mother (hemangiomas) ---------------------------------------------------------------------- Vital Signs                            Pulse:  105  BP:          130/79 ---------------------------------------------------------------------- Fetal Evaluation  Num Of Fetuses:         1  Fetal Heart Rate(bpm):  144  Cardiac Activity:       Observed  Presentation:           Cephalic  Placenta:               Anterior  P. Cord Insertion:      Previously Visualized  Amniotic Fluid  AFI FV:      Subjectively decreased  AFI Sum(cm)     %  Tile       Largest Pocket(cm)  5.93            < 3         3.7  RUQ(cm)       RLQ(cm)       LUQ(cm)        LLQ(cm)  2.23          3.7           0              0 ---------------------------------------------------------------------- Biophysical Evaluation  Amniotic F.V:   Pocket => 2 cm             F. Tone:        Observed  F. Movement:    Observed                   Score:          8/8  F. Breathing:   Observed ---------------------------------------------------------------------- OB History  Gravidity:    2  Living:       1  ---------------------------------------------------------------------- Gestational Age  LMP:           36w 3d        Date:  03/06/21                  EDD:   12/11/21  Best:          Harolyn Rutherford 3d     Det. By:  LMP  (03/06/21)          EDD:   12/11/21 ---------------------------------------------------------------------- Anatomy  Stomach:               Appears normal, left   Bladder:                Appears normal                         sided  Kidneys:               Appear normal  Other:  Anatomy previously visualized ---------------------------------------------------------------------- Impression  Antenatal testing performed given maternal elevated BMI  The biophysical profile was 8/8 with good fetal movement and  objectively normal amniotic fluid volume, however it appears  subjectively low.  Consider maternal hydration of 2-3L per day to improve  amniotic fluid. If the amniotic fluid persist to be low normal  consider an NST at the next appt. ---------------------------------------------------------------------- Recommendations  Continue weekly testing. ----------------------------------------------------------------------              Sander Nephew, MD Electronically Signed Final Report   11/16/2021 03:56 pm ----------------------------------------------------------------------  [place] MFM FETAL BPP W/NONSTRESS  Result Date: 11/09/2021 ----------------------------------------------------------------------  OBSTETRICS REPORT                       (Signed Final 11/09/2021 04:49 pm) ---------------------------------------------------------------------- Patient Info  ID #:       [id number]                          D.O.B.:  1995-09-01 (26 yrs)  Name:       Heather Durham                Visit Date: 11/09/2021 12:46 pm ---------------------------------------------------------------------- Performed By  Attending:        Tama High MD        Ref. Address:  [place]                                                             [place]  Performed By:     Rodrigo Ran BS      Location[facility]         [facility]                    RDMS RVT                                 Fetal Care at                                                             [facility]  Referred By:      Caren Macadam MD ---------------------------------------------------------------------- Orders  #  Description                           Code        Ordered By  1  [place] MFM FETAL BPP                      62952.8     Peterson Ao     W/NONSTRESS ----------------------------------------------------------------------  #  Order #                     Accession #                Episode #  1  [id number]                   [id number]                 [id number] ---------------------------------------------------------------------- Indications  Obesity complicating pregnancy, third          O99.213  trimester (BMI 2)  Large for gestational age fetus affecting      O55.60X0  management of mother  Unspecified abnormal finding on antenatal      O28.9  screening of mother (hemangiomas)  [redacted] weeks gestation of pregnancy  Z3A.35  Encounter for other antenatal screening        Z36.2  follow-up ---------------------------------------------------------------------- Fetal Evaluation  Num Of Fetuses:         1  Cardiac Activity:       Observed  Placenta:               Anterior  P. Cord Insertion:      Previously Visualized  Amniotic Fluid  AFI FV:      Within normal limits ---------------------------------------------------------------------- Biophysical Evaluation  Amniotic F.V:   Within normal limits       F. Tone:        Observed  F. Movement:    Observed                   N.S.T:          Reactive  F. Breathing:   Not Observed               Score:          8/10  ---------------------------------------------------------------------- OB History  Gravidity:    2  Living:       1 ---------------------------------------------------------------------- Gestational Age  LMP:           35w 3d        Date:  03/06/21                  EDD:   12/11/21  Best:          Barbie Haggis 3d     Det. By:  LMP  (03/06/21)          EDD:   12/11/21 ---------------------------------------------------------------------- Anatomy  Stomach:               Appears normal, left   Bladder:                Appears normal                         sided  Kidneys:               Appear normal ---------------------------------------------------------------------- Impression  Prepregnancy BMI 42.  Amniotic fluid is normal and good fetal activity seen.  Fetal  breathing movements did not meet the criteria of BPP.  NST  is reactive.  BPP 8/10.  Cephalic presentation.  We reassured the patient of the findings. ---------------------------------------------------------------------- Recommendations  -Continue weekly BPP till delivery. ----------------------------------------------------------------------                  Tama High, MD Electronically Signed Final Report   11/09/2021 04:49 pm ----------------------------------------------------------------------  [place] MFM OB FOLLOW UP  Result Date: 11/03/2021 ----------------------------------------------------------------------  OBSTETRICS REPORT                       (Signed Final 11/03/2021 04:50 pm) ---------------------------------------------------------------------- Patient Info  ID #:       [id number]                          D.O.B.:  1995-05-29 (26 yrs)  Name:       Heather Durham                Visit Date: 11/02/2021 03:02 pm ---------------------------------------------------------------------- Performed By  Attending:        Johnell Comings MD         Ref. Address:     [address]  [place]                                                             [place]  Performed By:     Germain Osgood            [facility]                    RDMS                                     Fetal Care at                                                             [facility]  Referred By:      Caren Macadam MD ---------------------------------------------------------------------- Orders  #  Description                           Code        Ordered By  1  [place] MFM OB FOLLOW UP                   76816.01    YU FANG  2  [place] MFM FETAL BPP WO NON               76819.01    YU FANG     STRESS ----------------------------------------------------------------------  #  Order #                     Accession #                Episode #  1  [id number]                   [id number]                 [id number]  2  [id number]                   [id number]                 [id number] ---------------------------------------------------------------------- Indications  Obesity complicating pregnancy, third          O99.213  trimester (BMI 63)  Large for gestational age fetus affecting      O53.60X0  management of mother  Unspecified abnormal finding on antenatal      O28.9  screening of mother (hemangiomas)  [redacted]  weeks gestation of pregnancy                Z3A.34 ---------------------------------------------------------------------- Fetal Evaluation  Num Of Fetuses:         1  Fetal Heart Rate(bpm):  126  Cardiac Activity:       Observed  Placenta:               Anterior  Amniotic Fluid  AFI FV:      Within normal limits  AFI Sum(cm)     %Tile       Largest Pocket(cm)  9.59            17          3.13  RUQ(cm)       RLQ(cm)       LUQ(cm)        LLQ(cm)  1.6           2.79          3.13           2.07 ---------------------------------------------------------------------- Biophysical Evaluation  Amniotic F.V:   Within normal limits       F. Tone:         Observed  F. Movement:    Observed                   Score:          8/8  F. Breathing:   Observed ---------------------------------------------------------------------- Biometry  BPD:     85.44  mm     G. Age:  34w 3d         50  %    CI:        76.16   %    70 - 86                                                          FL/HC:      22.9   %    19.4 - 21.8  HC:    310.27   mm     G. Age:  34w 5d         21  %    HC/AC:      0.97        0.96 - 1.11  AC:      320.4  mm     G. Age:  36w 0d         90  %    FL/BPD:     83.2   %    71 - 87  FL:      71.09  mm     G. Age:  36w 3d         88  %    FL/AC:      22.2   %    20 - 24  Est. FW:    2763  gm      6 lb 1 oz     82  % ---------------------------------------------------------------------- OB History  Gravidity:    2  Living:       1 ---------------------------------------------------------------------- Gestational Age  LMP:           34w 3d        Date:  03/06/21  EDD:   12/11/21  U/S Today:     35w 3d                                        EDD:   12/04/21  Best:          34w 3d     Det. By:  LMP  (03/06/21)          EDD:   12/11/21 ---------------------------------------------------------------------- Anatomy  Cranium:               Appears normal         Aortic Arch:            Appears normal  Cavum:                 Previously seen        Ductal Arch:            Appears normal  Ventricles:            Appears normal         Diaphragm:              Appears normal  Choroid Plexus:        Previously seen        Stomach:                Appears normal, left                                                                        sided  Cerebellum:            Previously seen        Abdomen:                Appears normal  Posterior Fossa:       Previously seen        Abdominal Wall:         Previously seen  Nuchal Fold:           Not applicable (>41    Cord Vessels:           Previously seen                         wks GA)  Face:                   Orbits and profile     Kidneys:                Appear normal                         previously seen  Lips:                  Previously seen        Bladder:                Appears normal  Thoracic:              Appears normal         Spine:  Limited views                                                                        previously seen  Heart:                 Appears normal         Upper Extremities:      Previously seen                         (4CH, axis, and                         situs)  RVOT:                  Previously seen        Lower Extremities:      Previously seen  LVOT:                  Previously seen  Other:  Hands and feet previously visualized. Fetus appears to be female.          Technically difficult due to maternal habitus and fetal position. ---------------------------------------------------------------------- Cervix Uterus Adnexa  Cervix  Not visualized (advanced GA >24wks) ---------------------------------------------------------------------- Comments  This patient was seen for a follow up growth scan and BPP  due to maternal obesity.  She denies any problems since her  last exam and has screened negative for gestational diabetes.  She was advised that the fetal growth and amniotic fluid level  appeared appropriate for her gestational age.  A BPP performed today was 8 out of 8.  She will return in 1 week for another BPP. ----------------------------------------------------------------------                   Johnell Comings, MD Electronically Signed Final Report   11/03/2021 04:50 pm ----------------------------------------------------------------------  [place] MFM FETAL BPP WO NON STRESS  Result Date: 11/03/2021 ----------------------------------------------------------------------  OBSTETRICS REPORT                       (Signed Final 11/03/2021 04:50 pm) ---------------------------------------------------------------------- Patient Info  ID #:       [id number]                           D.O.B.:  10-15-1995 (26 yrs)  Name:       Heather Durham                Visit Date: 11/02/2021 03:02 pm ---------------------------------------------------------------------- Performed By  Attending:        Johnell Comings MD         Ref. Address:     [address]  [place]                                                             [place]  Performed By:     Germain Osgood            [facility]         [facility]                    RDMS                                     Fetal Care at                                                             [facility]  Referred By:      Caren Macadam MD ---------------------------------------------------------------------- Orders  #  Description                           Code        Ordered By  1  [place] MFM OB FOLLOW UP                   76816.01    YU FANG  2  [place] MFM FETAL BPP WO NON               76819.01    YU FANG     STRESS ----------------------------------------------------------------------  #  Order #                     Accession #                Episode #  1  [id number]                   [id number]                 [id number]  2  [id number]                   [id number]                 [id number] ---------------------------------------------------------------------- Indications  Obesity complicating pregnancy, third          O99.213  trimester (BMI 67)  Large for gestational age fetus affecting      O90.60X0  management of mother  Unspecified abnormal finding on antenatal      O28.9  screening of mother (hemangiomas)  [redacted] weeks gestation of pregnancy                Z3A.34 ---------------------------------------------------------------------- Fetal Evaluation  Num Of Fetuses:         1  Fetal Heart Rate(bpm):  126  Cardiac Activity:       Observed  Placenta:  Anterior  Amniotic Fluid   AFI FV:      Within normal limits  AFI Sum(cm)     %Tile       Largest Pocket(cm)  9.59            17          3.13  RUQ(cm)       RLQ(cm)       LUQ(cm)        LLQ(cm)  1.6           2.79          3.13           2.07 ---------------------------------------------------------------------- Biophysical Evaluation  Amniotic F.V:   Within normal limits       F. Tone:        Observed  F. Movement:    Observed                   Score:          8/8  F. Breathing:   Observed ---------------------------------------------------------------------- Biometry  BPD:     85.44  mm     G. Age:  34w 3d         50  %    CI:        76.16   %    70 - 86                                                          FL/HC:      22.9   %    19.4 - 21.8  HC:    310.27   mm     G. Age:  34w 5d         21  %    HC/AC:      0.97        0.96 - 1.11  AC:      320.4  mm     G. Age:  36w 0d         90  %    FL/BPD:     83.2   %    71 - 87  FL:      71.09  mm     G. Age:  36w 3d         88  %    FL/AC:      22.2   %    20 - 24  Est. FW:    2763  gm      6 lb 1 oz     82  % ---------------------------------------------------------------------- OB History  Gravidity:    2  Living:       1 ---------------------------------------------------------------------- Gestational Age  LMP:           34w 3d        Date:  03/06/21                  EDD:   12/11/21  U/S Today:     35w 3d                                        EDD:   12/04/21  Best:  34w 3d     Det. By:  LMP  (03/06/21)          EDD:   12/11/21 ---------------------------------------------------------------------- Anatomy  Cranium:               Appears normal         Aortic Arch:            Appears normal  Cavum:                 Previously seen        Ductal Arch:            Appears normal  Ventricles:            Appears normal         Diaphragm:              Appears normal  Choroid Plexus:        Previously seen        Stomach:                Appears normal, left                                                                         sided  Cerebellum:            Previously seen        Abdomen:                Appears normal  Posterior Fossa:       Previously seen        Abdominal Wall:         Previously seen  Nuchal Fold:           Not applicable (>41    Cord Vessels:           Previously seen                         wks GA)  Face:                  Orbits and profile     Kidneys:                Appear normal                         previously seen  Lips:                  Previously seen        Bladder:                Appears normal  Thoracic:              Appears normal         Spine:                  Limited views  previously seen  Heart:                 Appears normal         Upper Extremities:      Previously seen                         (4CH, axis, and                         situs)  RVOT:                  Previously seen        Lower Extremities:      Previously seen  LVOT:                  Previously seen  Other:  Hands and feet previously visualized. Fetus appears to be female.          Technically difficult due to maternal habitus and fetal position. ---------------------------------------------------------------------- Cervix Uterus Adnexa  Cervix  Not visualized (advanced GA >24wks) ---------------------------------------------------------------------- Comments  This patient was seen for a follow up growth scan and BPP  due to maternal obesity.  She denies any problems since her  last exam and has screened negative for gestational diabetes.  She was advised that the fetal growth and amniotic fluid level  appeared appropriate for her gestational age.  A BPP performed today was 8 out of 8.  She will return in 1 week for another BPP. ----------------------------------------------------------------------                   Johnell Comings, MD Electronically Signed Final Report   11/03/2021 04:50 pm  ----------------------------------------------------------------------   Assessment:  Heather Durham is a 26 y.o. G33P1001 female at 62w5dadmitted for IOL secondary to oligohydramnios.   Plan:  Admit to Labor & Delivery, Cytotec ordered, will place FB when able, Pitocin and AROM when appropriate.  CBC, T&S, Regular diet, IVF GBS  negative, membranes intact Consents obtained. Continuous efm/toco Pain management: pt aware of all options, will ask if desired  Dr BLeafy Rocalled, reviewed pt's history and AFI, agrees with plan to admit for induction.   ----- LRoberto Scales [place]

## 2021-11-26 ENCOUNTER — Inpatient Hospital Stay: Payer: Medicaid Other | Admitting: Anesthesiology

## 2021-11-26 LAB — RPR: RPR Ser Ql: NONREACTIVE

## 2021-11-26 MED ORDER — SODIUM CHLORIDE 0.9 % IV SOLN
INTRAVENOUS | Status: DC | PRN
  Administered 2021-11-26 (×2): 5 mL via EPIDURAL

## 2021-11-26 MED ORDER — PHENYLEPHRINE 80 MCG/ML (10ML) SYRINGE FOR IV PUSH (FOR BLOOD PRESSURE SUPPORT): 80.0000 ug | PREFILLED_SYRINGE | INTRAVENOUS | Status: DC | PRN

## 2021-11-26 MED ORDER — DIPHENHYDRAMINE HCL 50 MG/ML IJ SOLN: 12.5000 mg | INTRAMUSCULAR | Status: DC | PRN

## 2021-11-26 MED ORDER — FENTANYL-BUPIVACAINE-NACL 0.5-0.125-0.9 MG/250ML-% EP SOLN
EPIDURAL | Status: AC
  Filled 2021-11-26: qty 250

## 2021-11-26 MED ORDER — LIDOCAINE HCL (PF) 1 % IJ SOLN
INTRAMUSCULAR | Status: DC | PRN
  Administered 2021-11-26 (×5): 1 mL via SUBCUTANEOUS

## 2021-11-26 MED ORDER — ACETAMINOPHEN 500 MG PO TABS
1000.0000 mg | ORAL_TABLET | Freq: Four times a day (QID) | ORAL | Status: DC | PRN
  Administered 2021-11-26: 1000 mg via ORAL
  Filled 2021-11-26: qty 2

## 2021-11-26 MED ORDER — FENTANYL-BUPIVACAINE-NACL 0.5-0.125-0.9 MG/250ML-% EP SOLN
12.0000 mL/h | EPIDURAL | Status: DC | PRN
  Administered 2021-11-26: 12 mL/h via EPIDURAL

## 2021-11-26 MED ORDER — EPHEDRINE 5 MG/ML INJ: 10.0000 mg | INTRAVENOUS | Status: DC | PRN

## 2021-11-26 MED ORDER — LACTATED RINGERS IV SOLN
500.0000 mL | Freq: Once | INTRAVENOUS | Status: AC
  Administered 2021-11-26: 500 mL via INTRAVENOUS

## 2021-11-26 MED ORDER — LIDOCAINE-EPINEPHRINE (PF) 1.5 %-1:200000 IJ SOLN
INTRAMUSCULAR | Status: DC | PRN
  Administered 2021-11-26: 3 mL via EPIDURAL

## 2021-11-26 NOTE — Progress Notes (Signed)
LABOR NOTE   SUBJECTIVE:   Heather Durham is a 26 y.o.  G2P1001  at 94w6dwhose labor is being induced for oligohydramnios. She received 3 doses of misoprostol and is now receiving Pitocin at 10 mU/min. Heather Durham is now having painful contractions and seeing bloody show. She would like some IV pain medications. She has made some cervical change since her last exam.  Analgesia: Labor support without medications and desires IV pain meds  OBJECTIVE:  BP 127/82   Pulse 98   Temp 98.1 F (36.7 C)   Resp 16   Ht '5\' 9"'$  (1.753 m)   Wt 133.4 kg Comment: 294lbs  LMP 03/06/2021   BMI 43.42 kg/m  Total I/O In: 10 [I.V.:10] Out: -   SVE:   Dilation: 4.5 Effacement (%): 50 Station: -2 Exam by:: Heather Durham CNM CONTRACTIONS: irregular, every 2-4 minutes FHR: Fetal heart tracing reviewed. Baseline: 130 Variability: moderate Accelerations: present Decelerations:none Category 1  Labs: Lab Results  Component Value Date   WBC 10.2 11/25/2021   HGB 11.1 (L) 11/25/2021   HCT 35.1 (L) 11/25/2021   MCV 85.8 11/25/2021   PLT 224 11/25/2021    ASSESSMENT: 1) Induction of labor due to oligohydramnios,  progressing well on pitocin     Coping: tearful     Membranes: intact  Principal Problem:   Oligohydramnios   PLAN: IV pain medication Continue to titrate Pitocin Anticipate NSVD  Heather Durham CNM 11/26/2021 11:24 AM

## 2021-11-26 NOTE — Progress Notes (Signed)
Called for strip review. Last accels at 8:00 and definitively at 7:00, with intermittent decels. Called for extended decel with improvement with conservative measures. Currently moderate variability with intermittent late decels, in recovery from rapid cervical change to 7cm. Will continue to monitor. Pitocin held. Operative or surgical delivery possible.  Cat II

## 2021-11-26 NOTE — Progress Notes (Signed)
LABOR NOTE   SUBJECTIVE:   Heather Durham is a 26 y.o.  G2P1001  at 28w6dwhose labor is being induced for oligohydramnios. She received 3 doses of misoprostol and is now receiving Pitocin at 22 mU/min. She received 2 doses of fentanyl; she states this helped manage her pain, but she did not like the way it made her feel. She is considering an epidural soon but would like to eat first. Her cervix is more effaced than at her last exam.  Analgesia: Labor support without medications. Desires epidural.  OBJECTIVE:  BP (!) 140/95   Pulse (!) 104   Temp 98.5 F (36.9 C) (Oral)   Resp 16   Ht '5\' 9"'$  (1.753 m)   Wt 133.4 kg Comment: 294lbs  LMP 03/06/2021   BMI 43.42 kg/m  Total I/O In: 90 [I.V.:90] Out: -   SVE:   Dilation: 4.5 Effacement (%): 70 Station: -2 Exam by:: Zoey Gilkeson CNM CONTRACTIONS: regular, every 1-3 minutes FHR: Fetal heart tracing reviewed. Baseline: 130 Variability: moderate Accelerations: present Decelerations:none Category 1  Labs: Lab Results  Component Value Date   WBC 10.2 11/25/2021   HGB 11.1 (L) 11/25/2021   HCT 35.1 (L) 11/25/2021   MCV 85.8 11/25/2021   PLT 224 11/25/2021    ASSESSMENT: 1)  Induction of labor for oligohydramnios, making slow cervical change     Coping: well     Membranes: intact   Principal Problem:   Oligohydramnios   PLAN: Continue to titrate Pitocin Consider AROM Epidural when desired Anticipate NSVD Dr. BLeafy Roupdated  MLloyd Huger CNM 11/26/2021 3:28 PM

## 2021-11-26 NOTE — Anesthesia Procedure Notes (Addendum)
Epidural Patient location during procedure: OB Start time: 11/26/2021 5:11 PM End time: 11/26/2021 5:49 PM  Staffing Anesthesiologist: Mailey Landstrom, Precious Haws, MD Performed: anesthesiologist   Preanesthetic Checklist Completed: patient identified, IV checked, site marked, risks and benefits discussed, surgical consent, monitors and equipment checked, pre-op evaluation and timeout performed  Epidural Patient position: sitting Prep: ChloraPrep Patient monitoring: heart rate, continuous pulse ox and blood pressure Approach: midline Location: L3-L4 Injection technique: LOR saline  Needle:  Needle type: Tuohy  Needle gauge: 17 G Needle length: 15 cm and 9 Needle insertion depth: 10 cm Catheter type: closed end flexible Catheter size: 19 Gauge Catheter at skin depth: 15 cm Test dose: negative and 1.5% lidocaine with Epi 1:200 K  Assessment Sensory level: T10 Events: blood not aspirated, injection not painful, no injection resistance, no paresthesia and negative IV test  Additional Notes Multiple attempts due to patient positioning and high degree of adiposity over the lumbar spine.  First attempts with 9cm needle and final attempt with 15 cm needle. Pt. Evaluated and documentation done after procedure finished. Patient identified. Risks/Benefits/Options discussed with patient including but not limited to bleeding, infection, nerve damage, paralysis, failed block, incomplete pain control, headache, blood pressure changes, nausea, vomiting, reactions to medication both or allergic, itching and postpartum back pain. Confirmed with bedside nurse the patient's most recent platelet count. Confirmed with patient that they are not currently taking any anticoagulation, have any bleeding history or any family history of bleeding disorders. Patient expressed understanding and wished to proceed. All questions were answered. Sterile technique was used throughout the entire procedure. Please see  nursing notes for vital signs. Test dose was given through epidural catheter and negative prior to continuing to dose epidural or start infusion. Warning signs of high block given to the patient including shortness of breath, tingling/numbness in hands, complete motor block, or any concerning symptoms with instructions to call for help. Patient was given instructions on fall risk and not to get out of bed. All questions and concerns addressed with instructions to call with any issues or inadequate analgesia.    Patient tolerated the insertion well without immediate complications.Reason for block:procedure for pain

## 2021-11-26 NOTE — Plan of Care (Signed)

## 2021-11-26 NOTE — Progress Notes (Signed)
LABOR NOTE   SUBJECTIVE:   Heather Durham is a 26 y.o.  G2P1001  at 50w6dwhose labor is being induced for oligohydramnios. She is receiving Pitocin at 24 mU/min. She is now comfortable with her epidural. We discussed AROM at this time, and she is in agreement with the plan. She is making slow cervical change.   Analgesia: Epidural  OBJECTIVE:  BP 131/78   Pulse (!) 104   Temp 98.8 F (37.1 C) (Oral)   Resp 14   Ht '5\' 9"'$  (1.753 m)   Wt 133.4 kg Comment: 294lbs  LMP 03/06/2021   SpO2 100%   BMI 43.42 kg/m  No intake/output data recorded.  SVE:   Dilation: 5 Effacement (%): 70 Station: -2 Exam by:: Linlee Cromie CNM CONTRACTIONS: regular, every 1- minutes FHR: Fetal heart tracing reviewed. Baseline: 140 Variability: moderate Accelerations: present Decelerations:late Category 2  Labs: Lab Results  Component Value Date   WBC 10.2 11/25/2021   HGB 11.1 (L) 11/25/2021   HCT 35.1 (L) 11/25/2021   MCV 85.8 11/25/2021   PLT 224 11/25/2021    ASSESSMENT: 1)  Induction of labor d/t oligohydramnios, progressing slowly on Pitocin     Coping: well.     Membranes: ruptured, clear fluid      Principal Problem:   Oligohydramnios   PLAN: AROM done Frequent repositioning Continue to titrate Pitocin Consider internal monitors to ensure adequate contractions Anticipate NSVD   MLloyd Huger CNM 11/26/2021 7:17 PM

## 2021-11-26 NOTE — Progress Notes (Signed)
   Subjective:  Ambulating in room. Rates contractions "7/10", they have been like this for the last hour.   Objective:   Vitals: Blood pressure 135/88, pulse 99, temperature 98.3 F (36.8 C), temperature source Oral, resp. rate 16, height '5\' 9"'$  (1.753 m), weight 133.4 kg, last menstrual period 03/06/2021, unknown if currently breastfeeding. General: NAD Abdomen:non tender Cervical Exam:  Dilation: 4 Effacement (%): 50 Cervical Position: Posterior Station: -2 Presentation: Vertex Exam by:: L Talani Brazee CNM  FHT: baseline 125, moderate variability, pos accel neg decel  Toco:q1-3,5 Pitocin at 8 milli units   Results for orders placed or performed during the hospital encounter of 11/25/21 (from the past 24 hour(s))  CBC     Status: Abnormal   Collection Time: 11/25/21  1:40 PM  Result Value Ref Range   WBC 10.2 4.0 - 10.5 K/uL   RBC 4.09 3.87 - 5.11 MIL/uL   Hemoglobin 11.1 (L) 12.0 - 15.0 g/dL   HCT 35.1 (L) 36.0 - 46.0 %   MCV 85.8 80.0 - 100.0 fL   MCH 27.1 26.0 - 34.0 pg   MCHC 31.6 30.0 - 36.0 g/dL   RDW 14.5 11.5 - 15.5 %   Platelets 224 150 - 400 K/uL   nRBC 0.0 0.0 - 0.2 %  Type and screen [place]     Status: None   Collection Time: 11/25/21  1:40 PM  Result Value Ref Range   ABO/RH(D) O POS    Antibody Screen NEG    Sample Expiration      11/28/2021,2359 Performed at [facility], [address] [place], [place] [address]     Assessment:   26 y.o. G2P1001 90w6dadmitted for IOL secondary to oligohydramnios  Plan:   1) Labor -received Cytotec x 3, Pitocin infusing   2) Fetus - category 1 tracing    3) GSB negative, Membranes intact    4) Pain management: aware of all options, will ask if desired.    LRoberto Scales [facility]  11/26/2021 7:28 AM

## 2021-11-26 NOTE — Progress Notes (Signed)
LABOR NOTE   SUBJECTIVE:   Heather Durham is a 26 y.o.  G2P1001  at 71w6dwhose labor is being induced for oligohydramnios. She was receiving 26 mU/min of Pitocin. She was noted to have late decelerations around 1907 which resolved with position change. At 2018, she had a prolonged decel that recovered to the 150s, followed by another prolonged decel at 2024. Heather Durham was repositioned, Pitocin was discontinued, and an IV fluid bolus was initiated. A cervical exam was done, and she was shown to have significant cervical change since her prior exam. FSE and IUPC were placed. Dr. BLeafy Rowas notified.   Analgesia: Epidural  OBJECTIVE:  BP 135/89   Pulse (!) 121   Temp 97.6 F (36.4 C) (Oral)   Resp 20   Ht '5\' 9"'$  (1.753 m)   Wt 133.4 kg Comment: 294lbs  LMP 03/06/2021   SpO2 100%   BMI 43.42 kg/m  No intake/output data recorded.  SVE:   Dilation: 7 Effacement (%): 80 Station: -1 Exam by:: MManufacturing systems engineerCNM CONTRACTIONS: regular, every 2-4 minutes FHR: Fetal heart tracing reviewed. Baseline: 135 Variability: minimal Accelerations: Decelerations:late and prolonged; no decels in the past 20 minutes Category 2  Labs: Lab Results  Component Value Date   WBC 10.2 11/25/2021   HGB 11.1 (L) 11/25/2021   HCT 35.1 (L) 11/25/2021   MCV 85.8 11/25/2021   PLT 224 11/25/2021    ASSESSMENT: 1)  Induction of labor, progressing well     Coping: well     Membranes: ruptured, clear fluid     Prolonged deceleration  Principal Problem:   Oligohydramnios   PLAN: Continue to monitor Consider re-starting Pitocin if needed if FHR is stable Discussed plan with Dr. BLeafy Ro who reviewed the strip Anticipate NSVD   MLloyd Huger CNM 11/26/2021 9:05 PM

## 2021-11-26 NOTE — Anesthesia Preprocedure Evaluation (Signed)
Anesthesia Evaluation  Patient identified by MRN, date of birth, ID band Patient awake    Reviewed: Allergy & Precautions, NPO status , Patient's Chart, lab work & pertinent test results  History of Anesthesia Complications (+) history of anesthetic complications (previous epidural failed)  Airway Mallampati: III  TM Distance: >3 FB Neck ROM: full    Dental  (+) Chipped   Pulmonary neg pulmonary ROS   Pulmonary exam normal        Cardiovascular Exercise Tolerance: Good (-) hypertensionnegative cardio ROS Normal cardiovascular exam     Neuro/Psych    GI/Hepatic negative GI ROS,,,  Endo/Other    Morbid obesity  Renal/GU   negative genitourinary   Musculoskeletal   Abdominal   Peds  Hematology negative hematology ROS (+)   Anesthesia Other Findings Past Medical History: No date: Abnormal Pap smear of cervix No date: Chlamydia No date: Dysmenorrhea No date: Trichomonas infection No date: UTI (urinary tract infection)  Past Surgical History: No date: FOOT SURGERY; Right  BMI    Body Mass Index: 43.42 kg/m      Reproductive/Obstetrics (+) Pregnancy                             Anesthesia Physical Anesthesia Plan  ASA: 3  Anesthesia Plan: Epidural   Post-op Pain Management:    Induction:   PONV Risk Score and Plan:   Airway Management Planned: Natural Airway  Additional Equipment:   Intra-op Plan:   Post-operative Plan:   Informed Consent: I have reviewed the patients History and Physical, chart, labs and discussed the procedure including the risks, benefits and alternatives for the proposed anesthesia with the patient or authorized representative who has indicated his/her understanding and acceptance.     Dental Advisory Given  Plan Discussed with: Anesthesiologist  Anesthesia Plan Comments: (Patient reports no bleeding problems and no anticoagulant  use.   Patient consented for risks of anesthesia including but not limited to:  - adverse reactions to medications - risk of bleeding, infection and or nerve damage from epidural that could lead to paralysis - risk of headache or failed epidural - nerve damage due to positioning - that if epidural is used for C-section that there is a chance of epidural failure requiring spinal placement or conversion to GA - Damage to heart, brain, lungs, other parts of body or loss of life  Patient voiced understanding.)       Anesthesia Quick Evaluation

## 2021-11-26 NOTE — Progress Notes (Signed)
   Subjective:  Pt resting quietly per RN   Objective:   Vitals: Blood pressure 135/88, pulse 99, temperature 98.3 F (36.8 C), temperature source Oral, resp. rate 16, height '5\' 9"'$  (1.753 m), weight 133.4 kg, last menstrual period 03/06/2021, unknown if currently breastfeeding. General:  Abdomen: Cervical Exam:  Dilation: 3 Effacement (%): 50 Station: -2 Presentation: Vertex Exam by:: L. Rojelio Uhrich, CNM  FHT: baseline 125, moderate variability, pos accel neg decel  Toco:irregular  Pitocin at 2 milli units  Results for orders placed or performed during the hospital encounter of 11/25/21 (from the past 24 hour(s))  CBC     Status: Abnormal   Collection Time: 11/25/21  1:40 PM  Result Value Ref Range   WBC 10.2 4.0 - 10.5 K/uL   RBC 4.09 3.87 - 5.11 MIL/uL   Hemoglobin 11.1 (L) 12.0 - 15.0 g/dL   HCT 35.1 (L) 36.0 - 46.0 %   MCV 85.8 80.0 - 100.0 fL   MCH 27.1 26.0 - 34.0 pg   MCHC 31.6 30.0 - 36.0 g/dL   RDW 14.5 11.5 - 15.5 %   Platelets 224 150 - 400 K/uL   nRBC 0.0 0.0 - 0.2 %  Type and screen [place]     Status: None   Collection Time: 11/25/21  1:40 PM  Result Value Ref Range   ABO/RH(D) O POS    Antibody Screen NEG    Sample Expiration      11/28/2021,2359 Performed at [facility], [address], [place], [place] [address]     Assessment:   26 y.o. G2P1001 70w6dadmitted for IOL secondary to oligohydramnios  Plan:   1) Labor -received Cytotec x 3, Pitocin now infusing  2) Fetus - category 1 tracing    3) GSB negative, Membranes intact    4) Pain management: aware of all options, will ask if desired.    LRoberto Scales C[facility]  11/26/2021 3:02 AM

## 2021-11-27 ENCOUNTER — Encounter: Payer: Self-pay | Admitting: Obstetrics and Gynecology

## 2021-11-27 ENCOUNTER — Encounter: Payer: Medicaid Other | Admitting: Certified Nurse Midwife

## 2021-11-27 DIAGNOSIS — O4103X0 Oligohydramnios, third trimester, not applicable or unspecified: Secondary | ICD-10-CM | POA: Diagnosis not present

## 2021-11-27 DIAGNOSIS — Z3A38 38 weeks gestation of pregnancy: Secondary | ICD-10-CM

## 2021-11-27 DIAGNOSIS — E669 Obesity, unspecified: Secondary | ICD-10-CM | POA: Diagnosis not present

## 2021-11-27 DIAGNOSIS — O99214 Obesity complicating childbirth: Secondary | ICD-10-CM

## 2021-11-27 MED ORDER — PRENATAL MULTIVITAMIN CH
1.0000 | ORAL_TABLET | Freq: Every day | ORAL | Status: DC
  Administered 2021-11-27 – 2021-11-28 (×2): 1 via ORAL
  Filled 2021-11-27 (×2): qty 1

## 2021-11-27 MED ORDER — OXYTOCIN-SODIUM CHLORIDE 30-0.9 UT/500ML-% IV SOLN: 2.5000 [IU]/h | INTRAVENOUS | Status: DC | PRN

## 2021-11-27 MED ORDER — CALCIUM CARBONATE ANTACID 500 MG PO CHEW: 1.0000 | CHEWABLE_TABLET | ORAL | Status: DC | PRN

## 2021-11-27 MED ORDER — COCONUT OIL OIL: 1.0000 | TOPICAL_OIL | Status: DC | PRN

## 2021-11-27 MED ORDER — DOCUSATE SODIUM 100 MG PO CAPS
100.0000 mg | ORAL_CAPSULE | Freq: Two times a day (BID) | ORAL | Status: DC
  Administered 2021-11-28 (×2): 100 mg via ORAL
  Filled 2021-11-27 (×2): qty 1

## 2021-11-27 MED ORDER — DIPHENHYDRAMINE HCL 25 MG PO CAPS: 25.0000 mg | ORAL_CAPSULE | Freq: Four times a day (QID) | ORAL | Status: DC | PRN

## 2021-11-27 MED ORDER — VARICELLA VIRUS VACCINE LIVE 1350 PFU/0.5ML IJ SUSR: 0.5000 mL | INTRAMUSCULAR | Status: DC | PRN

## 2021-11-27 MED ORDER — SIMETHICONE 80 MG PO CHEW: 80.0000 mg | CHEWABLE_TABLET | ORAL | Status: DC | PRN

## 2021-11-27 MED ORDER — OXYCODONE-ACETAMINOPHEN 5-325 MG PO TABS: 1.0000 | ORAL_TABLET | ORAL | Status: DC | PRN

## 2021-11-27 MED ORDER — SOD CITRATE-CITRIC ACID 500-334 MG/5ML PO SOLN
30.0000 mL | Freq: Once | ORAL | Status: AC
  Administered 2021-11-27: 30 mL via ORAL

## 2021-11-27 MED ORDER — BENZOCAINE-MENTHOL 20-0.5 % EX AERO: 1.0000 | INHALATION_SPRAY | CUTANEOUS | Status: DC | PRN

## 2021-11-27 MED ORDER — SOD CITRATE-CITRIC ACID 500-334 MG/5ML PO SOLN
ORAL | Status: AC
  Filled 2021-11-27: qty 15

## 2021-11-27 MED ORDER — IBUPROFEN 600 MG PO TABS
600.0000 mg | ORAL_TABLET | Freq: Four times a day (QID) | ORAL | Status: DC
  Administered 2021-11-27 – 2021-11-28 (×6): 600 mg via ORAL
  Filled 2021-11-27 (×6): qty 1

## 2021-11-27 MED ORDER — ACETAMINOPHEN 325 MG PO TABS
650.0000 mg | ORAL_TABLET | ORAL | Status: DC | PRN
  Administered 2021-11-27: 650 mg via ORAL
  Filled 2021-11-27: qty 2

## 2021-11-27 NOTE — Progress Notes (Signed)
LABOR NOTE   SUBJECTIVE:   Heather Durham is a 26 y.o.  G2P1001  at 52w0dwhose labor is being induced for oligohydramnios. She has made excellent progress over the past few hours and is now fully dilated. She has had Pitocin turned off and restarted a few times d/t FHR decels. She is coping well and is comfortable with her epidural.  Analgesia: Epidural  OBJECTIVE:  BP 125/62   Pulse (!) 124   Temp 98.6 F (37 C) (Oral)   Resp 20   Ht '5\' 9"'$  (1.753 m)   Wt 133.4 kg Comment: 294lbs  LMP 03/06/2021   SpO2 100%   BMI 43.42 kg/m  No intake/output data recorded.  SVE:   Dilation: 10 Effacement (%): 100 Station: Plus 2 Exam by:: MCherylann Parr CNM CONTRACTIONS: regular, every 3-5 minutes FHR: Fetal heart tracing reviewed. Baseline: 125 Variability: moderate Accelerations: present Decelerations:variable and early Category 2  Labs: Lab Results  Component Value Date   WBC 10.2 11/25/2021   HGB 11.1 (L) 11/25/2021   HCT 35.1 (L) 11/25/2021   MCV 85.8 11/25/2021   PLT 224 11/25/2021    ASSESSMENT: 1) Induction of labor due to oligohydramnios,  progressing well on pitocin     Coping: well     Membranes: ruptured, clear fluid     Dilation complete      Principal Problem:   Oligohydramnios   PLAN: Begin pushing Anticipate NSVD Dr. BLeafy Roupdated  MLloyd Huger CNM 11/27/2021 12:46 AM

## 2021-11-27 NOTE — Progress Notes (Signed)
Post partum rounding Note  Subjective:  Heather Durham overall reports doing well. Reports pain on back at epidural site, states it took multiple pokes to get epidural placed. Is taking ibuprofen and tylenol for pain which is enough. Only has cramping pain when nursing, reports breastfeeding is going well, baby latches easily, denies nipple pain. Reports small amount of lochia rubra, no issues with using the restroom. Has been up and walking around. Is currently being visited by family and is in good spirits.   Hospital Course: Had IOL at 38wk for oligo. Received misprostol and pitocin. Perinium was intact with EBL of 180m. Has had routine postpartum care.   Objective:  Vital signs in last 24 hours: Temp:  [97.5 F (36.4 C)-98.8 F (37.1 C)] 98 F (36.7 C) (11/13 1136) Pulse Rate:  [87-150] 87 (11/13 1136) Resp:  [14-20] 20 (11/13 1136) BP: (100-149)/(50-125) 126/76 (11/13 1136) SpO2:  [97 %-100 %] 99 % (11/13 1136)    General: NAD Pulmonary: no increased work of breathing Abdomen: non-distended, non-tender, fundus firm at level of umbilicus Extremities: no edema, no erythema, no tenderness  Results for orders placed or performed during the hospital encounter of 11/25/21 (from the past 72 hour(s))  CBC     Status: Abnormal   Collection Time: 11/25/21  1:40 PM  Result Value Ref Range   WBC 10.2 4.0 - 10.5 K/uL   RBC 4.09 3.87 - 5.11 MIL/uL   Hemoglobin 11.1 (L) 12.0 - 15.0 g/dL   HCT 35.1 (L) 36.0 - 46.0 %   MCV 85.8 80.0 - 100.0 fL   MCH 27.1 26.0 - 34.0 pg   MCHC 31.6 30.0 - 36.0 g/dL   RDW 14.5 11.5 - 15.5 %   Platelets 224 150 - 400 K/uL   nRBC 0.0 0.0 - 0.2 %    Comment: [facility] [address] [place] [address] Type and screen [place]    Status: None   Collection Time: 11/25/21  1:40 PM  Result Value Ref Range   ABO/RH(D) O POS    Antibody Screen NEG    Sample Expiration      11/28/2021,2359 Performed at  [facility] [address] [address]  RPR     Status: None   Collection Time: 11/25/21  1:40 PM  Result Value Ref Range   RPR Ser Ql NON REACTIVE NON REACTIVE    Comment: Performed at [facility] [address] [place] [address]   Assessment:   26y.o. [phone] day # 0 Breastfeeding VNI  Plan:   CBC tomorrow morning- screening for anemia Continued BF support IBU/tylenol as needed for pain Received Tdap prenatally, will offer varicella before discharge Anticipate d/c home tomorrow  [place] FNP 11/27/2021, 3:09 PM

## 2021-11-27 NOTE — Lactation Note (Signed)
This note was copied from a baby's chart. Lactation Consultation Note  Patient Name: Heather Durham [id number] Date: 11/27/2021 Reason for consult: Initial assessment;Early term 37-38.6wks Age:25 hours  Maternal Data Has patient been taught Hand Expression?: Yes Does the patient have breastfeeding experience prior to this delivery?: Yes How long did the patient breastfeed?: 16 months  P2, SVD 12 hours ago. Experienced breastfeeding mom, BF her first (now 89yr old) for 16+ months.   Feeding Mother's Current Feeding Choice: Breast Milk  Baby has been exclusively Bf'ing since delivery. Mom notes some discomfort with a couple of the latches but expresses knowledge in how to break the latch and try again. Baby has voided and stooled since delivery.  LATCH Score   Baby was sleeping during this visit.  Lactation Tools Discussed/Used    Interventions Interventions: Breast feeding basics reviewed;Hand express;Education (newborn feeding patterns and behaviors, early cues, feeding on demand/not the clock, hand expression, position/alignment, output expectations)  Reviewed head/neck support and use of support pillows with the early feeding period. Encouraged to bring baby to breast level, rub nipple nose to chin for wide open mouth. Encouraged 8-12 feeding attempts in first 24 hours and then 8-12 feedings every 24 hours afterwards.  Discharge    Consult Status Consult Status: Follow-up  Whiteboard updated with LC name/number.  SLavonia Drafts11/13/2023, 1:39 PM

## 2021-11-27 NOTE — Discharge Summary (Signed)
Postpartum Discharge Summary  Date of Service updated 11/28/2021     Patient Name: Heather Durham DOB: 30-Nov-1995 MRN: [id number]  Date of admission: 11/25/2021 Delivery date:11/27/2021  Delivering provider: Lurlean Horns  Date of discharge:   Admitting diagnosis: Oligohydramnios [O41.00X0] Intrauterine pregnancy: [redacted]w[redacted]d    Secondary diagnosis:  Principal Problem:   Oligohydramnios Active Problems:   Postpartum care following vaginal delivery  Additional problems: None    Discharge diagnosis: Term Pregnancy Delivered                                              Post partum procedures: NA Augmentation: AROM, Pitocin, and Cytotec Complications: None  Hospital course: Induction of Labor With Vaginal Delivery   26y.o. yo G2P1001 at 318w0das admitted to the hospital 11/25/2021 for induction of labor.  Indication for induction:  oligohydramnios .  Patient had an uncomplicated labor course. Membrane Rupture Time/Date: 6:56 PM ,11/26/2021   Delivery Method:Vaginal, Spontaneous  Episiotomy: None  Lacerations:  None  Deuncomplicated postpartum course.Patient is discharged home 11/28/2021.  Newborn Data: Birth date:11/27/2021  Birth time:1:00 AM  Gender:Female  Living status:Living  Apgars: 8, 9 Weight:3280 g   Magnesium Sulfate received: No BMZ received: No Rhophylac:N/A MMR:N/A T-DaP:Given prenatally Flu: Given prenatally Transfusion:No  Physical exam  Vitals:   11/27/21 1136 11/27/21 1541 11/28/21 0000 11/28/21 0743  BP: 126/76 (!) 135/94 111/68 129/89  Pulse: 87 80 84 89  Resp: _0 Temp: 98 F (36.7 C) 98.5 F (36.9 C) 97.9 F (36.6 C) 98.2 F (36.8 C)  TempSrc: Oral Oral Oral Oral  SpO2: 99% 100% 99% 99%  Weight:      Height:       General: alert, cooperative, and no distress Lochia: appropriate Uterine Fundus: firm Incision: N/A DVT Evaluation: No evidence of DVT seen on physical exam. Negative Homan's sign. Labs: Lab Results   Component Value Date   WBC 10.2 11/25/2021   HGB 11.1 (L) 11/25/2021   HCT 35.1 (L) 11/25/2021   MCV 85.8 11/25/2021   PLT 224 11/25/2021      Latest Ref Rng & Units 05/21/2020    6:31 PM  CMP  Glucose 70 - 99 mg/dL 102   BUN 6 - 20 mg/dL 8   Creatinine 0.44 - 1.00 mg/dL 0.84   Sodium 135 - 145 mmol/L 134   Potassium 3.5 - 5.1 mmol/L 3.5   Chloride 98 - 111 mmol/L 105   CO2 22 - 32 mmol/L 23   Calcium 8.9 - 10.3 mg/dL 9.2   Total Protein 6.5 - 8.1 g/dL 7.4   Total Bilirubin 0.3 - 1.2 mg/dL 0.4   Alkaline Phos 38 - 126 U/L 56   AST 15 - 41 U/L 15   ALT 0 - 44 U/L 12    Edinburgh Score:    11/27/2021    6:00 AM  Edinburgh Postnatal Depression Scale Screening Tool  I have been able to laugh and see the funny side of things. 0  I have looked forward with enjoyment to things. 0  I have blamed myself unnecessarily when things went wrong. 0  I have been anxious or worried for no good reason. 0  I have felt scared or panicky for no good reason. 0  Things have been getting on top of me. 1  I  have been so unhappy that I have had difficulty sleeping. 0  I have felt sad or miserable. 0  I have been so unhappy that I have been crying. 0  The thought of harming myself has occurred to me. 0  Edinburgh Postnatal Depression Scale Total 1      After visit meds:  Allergies as of 11/28/2021   No Known Allergies      Medication List     STOP taking these medications    metroNIDAZOLE 500 MG tablet Commonly known as: FLAGYL       TAKE these medications    ibuprofen 600 MG tablet Commonly known as: ADVIL Take 1 tablet (600 mg total) by mouth every 6 (six) hours.   prenatal multivitamin Tabs tablet Take 1 tablet by mouth daily at 12 noon.         Discharge home in stable condition Infant Feeding: Breast Infant Disposition:home with mother Discharge instruction: per After Visit Summary and Postpartum booklet. Activity: Advance as tolerated. Pelvic rest for 6  weeks.  Diet: routine diet Anticipated Birth Control: Unsure Discussed her mood challenges when on hormonal methods. WE did discuss an IUD option. She has hx of heavy periods. Postpartum Appointment:6 weeks and 2 week phone or video appointment Additional Postpartum F/U: Postpartum Depression checkup Future Appointments: No future appointments.  Follow up Visit:  Follow-up Information     [facility] Follow up in 6 week(s).   Why: Please call the office and make a phone or video appointment for 2 weeks post delivery and another appointment for a 6 weeks post delivery physical. If you think you may get an IUD or Nexplanon at the 6 week appointment, tell the scheduler. Contact information: [address] [place] [address] [phone]                 Margaret M Fryer, CNM  11/28/2021 9:47 AM        

## 2021-11-28 ENCOUNTER — Other Ambulatory Visit: Payer: Medicaid Other

## 2021-11-28 ENCOUNTER — Ambulatory Visit: Payer: Self-pay

## 2021-11-28 MED ORDER — IBUPROFEN 600 MG PO TABS: 600.0000 mg | ORAL_TABLET | Freq: Four times a day (QID) | ORAL | 0 refills | Status: DC

## 2021-11-28 NOTE — Lactation Note (Signed)
This note was copied from a baby's chart. Lactation Consultation Note  Patient Name: Heather Durham [id number] Date: 11/28/2021 Reason for consult: Follow-up assessment;Term;Nipple pain/trauma Age:25 hours  Maternal Data This is mom 2 2nd baby,SVD.Mom is an experienced breastfeeding mother.  Today on follow-up mom reports nipple soreness and baby tends to suckle for periods of time pacifying. Has patient been taught Hand Expression?: Yes Does the patient have breastfeeding experience prior to this delivery?: Yes How long did the patient breastfeed?: 16 months  Feeding Mother's Current Feeding Choice: Breast Milk  Interventions Interventions: Breast feeding basics reviewed;Hand express;Education (lanolin) Provided tips and strategies to maximize latch technique to decrease nipple soreness. Recommended mom alternate breasts each feeding session . Recommended mom use breast massage and tactile stimulation to keep baby interested when feeding.  Discharge Discharge Education: Engorgement and breast care;Warning signs for feeding baby;Outpatient recommendation Pump: Personal  Consult Status Consult Status: Follow-up Date: 11/28/21 Follow-up type: In-patient  Update provided to care nurse.  [name] 11/28/2021, 12:23 PM

## 2021-11-28 NOTE — Discharge Instructions (Signed)
Discharge instructions:   Call office if you have any of the following:  headache, visual changes, fever >101.0 F, chills, breast concerns (engorgement, mastitis) excessive vaginal bleeding, incision drainage or problems, leg pain or redness, depression or any other concerns.   Activity: Do not lift > 10 lbs for 6 weeks.  No intercourse or tampons for 6 weeks.  No driving for 1-2 weeks or while taking pain medication. No strenuous activity or heavy lifting for 6 weeks.  No swimming pools, hot tubs or tub baths- showers only.    It is normal to bleed for up to 6 weeks. You should not soak through more than 1 pad in 1 hour.   Continue prenatal vitamin. Increase calories and fluids while breastfeeding.  Your milk will come in, in the next couple of days (right now it is colostrum).  You may have a slight fever when your milk comes in, but it should go away on its own.   If it does not, and rises above 101 F please call the doctor.  You will also feel achy and your breasts will be firm. They will also start to leak.  If you are breastfeeding, continue as you have been and you can pump/express milk for comfort.   For concerns about your baby, please call your pediatrician For breastfeeding concerns, the lactation consultant can be reached at [phone]  Postpartum blues (feelings of happy one minute and sad another minute) are normal for the first few weeks but if it gets worse let your doctor know.

## 2021-11-28 NOTE — Anesthesia Postprocedure Evaluation (Signed)
Anesthesia Post Note  Patient: Heather Durham  Procedure(s) Performed: AN AD HOC LABOR EPIDURAL  Patient location during evaluation: Mother Baby Anesthesia Type: Epidural Level of consciousness: awake and alert Pain management: pain level controlled Vital Signs Assessment: post-procedure vital signs reviewed and stable Respiratory status: spontaneous breathing, nonlabored ventilation and respiratory function stable Cardiovascular status: stable Postop Assessment: no headache, no backache and epidural receding Anesthetic complications: no   No notable events documented.   Last Vitals:  Vitals:   11/28/21 0000 11/28/21 0743  BP: 111/68 129/89  Pulse: 84 89  Resp: 18 18  Temp: 36.6 C 36.8 C  SpO2: 99% 99%    Last Pain:  Vitals:   11/28/21 0808  TempSrc:   PainSc: 4                  Melainie Krinsky Lorenza Chick

## 2021-11-29 LAB — SURGICAL PATHOLOGY

## 2021-11-30 ENCOUNTER — Ambulatory Visit: Payer: Medicaid Other

## 2021-12-04 ENCOUNTER — Encounter: Payer: Medicaid Other | Admitting: Advanced Practice Midwife

## 2021-12-11 ENCOUNTER — Encounter: Payer: Medicaid Other | Admitting: Certified Nurse Midwife

## 2021-12-25 ENCOUNTER — Telehealth: Payer: Self-pay | Admitting: Obstetrics

## 2021-12-25 ENCOUNTER — Ambulatory Visit (INDEPENDENT_AMBULATORY_CARE_PROVIDER_SITE_OTHER): Payer: Medicaid Other | Admitting: Advanced Practice Midwife

## 2021-12-25 ENCOUNTER — Encounter: Payer: Self-pay | Admitting: Advanced Practice Midwife

## 2021-12-25 NOTE — Telephone Encounter (Signed)
I contacted patient via phone to scheduled 6 week PP with Heather S. Patient delivered 12/11/21 . I left message for patient to call back to be scheduled.

## 2021-12-27 NOTE — Progress Notes (Signed)
Date of Service: 12/25/2021  2 week postpartum telephone visit  Dabney reports overall doing well although a bit sleep deprived. She is taking a nap when her older child goes to daycare. She has some support. Her moods are ok. Breastfeeding is going well now following treatment for thrush. Bleeding is now just spotting. She denies current breast, bowel or bladder concerns.   Review of Systems  Constitutional:  Negative for chills and fever.  HENT:  Negative for congestion, ear discharge, ear pain, hearing loss, sinus pain and sore throat.   Eyes:  Negative for blurred vision and double vision.  Respiratory:  Negative for cough, shortness of breath and wheezing.   Cardiovascular:  Negative for chest pain, palpitations and leg swelling.  Gastrointestinal:  Negative for abdominal pain, blood in stool, constipation, diarrhea, heartburn, melena, nausea and vomiting.  Genitourinary:  Negative for dysuria, flank pain, frequency, hematuria and urgency.  Musculoskeletal:  Negative for back pain, joint pain and myalgias.  Skin:  Negative for itching and rash.  Neurological:  Negative for dizziness, tingling, tremors, sensory change, speech change, focal weakness, seizures, loss of consciousness, weakness and headaches.  Endo/Heme/Allergies:  Negative for environmental allergies. Does not bruise/bleed easily.  Psychiatric/Behavioral:  Negative for depression, hallucinations, memory loss, substance abuse and suicidal ideas. The patient is not nervous/anxious and does not have insomnia.      No physical exam done today due to telephone visit.   Edinburgh Postnatal Depression Scale - 12/25/21 1605       Edinburgh Postnatal Depression Scale:  In the Past 7 Days   I have been able to laugh and see the funny side of things. 0    I have looked forward with enjoyment to things. 0    I have blamed myself unnecessarily when things went wrong. 0    I have been anxious or worried for no good reason. 0    I  have felt scared or panicky for no good reason. 0    Things have been getting on top of me. 0    I have been so unhappy that I have had difficulty sleeping. 0    I have felt sad or miserable. 0    I have been so unhappy that I have been crying. 0    The thought of harming myself has occurred to me. 0    Edinburgh Postnatal Depression Scale Total 0            26 year old G62 P86 female routine 2 week postpartum telephone visit  Schedule 6 week postpartum follow up visit/initiation of birth control   Rod Can, [place]

## 2021-12-27 NOTE — Telephone Encounter (Signed)
I contacted patient via phone. Phone on file is not correct per the person answering. Patient hasn't read my chart.

## 2021-12-27 NOTE — Telephone Encounter (Signed)
I attempt to call patient's mother whom is on DPR dating back to 10/09/16. No answer no message lefted.

## 2022-01-17 ENCOUNTER — Encounter: Payer: Self-pay | Admitting: Obstetrics

## 2022-01-17 ENCOUNTER — Ambulatory Visit (INDEPENDENT_AMBULATORY_CARE_PROVIDER_SITE_OTHER): Payer: Medicaid Other | Admitting: Obstetrics

## 2022-01-17 NOTE — Progress Notes (Signed)
[facility] Partum Visit Note  Heather Durham is a 27 y.o. G65P2002 female who presents for a postpartum visit. She is 7 weeks postpartum following a normal spontaneous vaginal delivery.  I have fully reviewed the prenatal and intrapartum course and was present for her birth. The birth was at 61 gestational weeks.  Anesthesia: epidural. Postpartum course has been uncomplicated. Baby is doing well but is colicky and does not sleep well. Baby is feeding by breast. Bleeding: spotting only. Bowel function is  normal, with occasional constipation . Bladder function is normal. Patient is not sexually active. Contraception method is abstinence/LAM/barrier method and she plans an IUD. Postpartum depression screening: negative, some concerns for anxiety.   The pregnancy intention screening data noted above was reviewed. Potential methods of contraception were discussed. The patient elected to proceed with No data recorded.   Edinburgh Postnatal Depression Scale - 01/17/22 1535       Edinburgh Postnatal Depression Scale:  In the Past 7 Days   I have been able to laugh and see the funny side of things. 0    I have looked forward with enjoyment to things. 0    I have blamed myself unnecessarily when things went wrong. 0    I have been anxious or worried for no good reason. 2    I have felt scared or panicky for no good reason. 0    Things have been getting on top of me. 1    I have been so unhappy that I have had difficulty sleeping. 0    I have felt sad or miserable. 0    I have been so unhappy that I have been crying. 0    The thought of harming myself has occurred to me. 0    Edinburgh Postnatal Depression Scale Total 3             Health Maintenance Due  Topic Date Due   COVID-19 Vaccine (1) Never done   HPV VACCINES (1 - 2-dose series) Never done    The following portions of the patient's history were reviewed and updated as appropriate: allergies, current medications, past family  history, past medical history, past social history, past surgical history, and problem list.  Review of Systems Pertinent items noted in HPI and remainder of comprehensive ROS otherwise negative.  Objective:  BP 111/79   Pulse 90   Ht '5\' 7"'$  (1.702 m)   Wt 270 lb (122.5 kg)   Breastfeeding Yes   BMI 42.29 kg/m    General:  alert, cooperative, and appears stated age   Breasts:  normal  Lungs: clear to auscultation bilaterally  Heart:  regular rate and rhythm, S1, S2 normal, no murmur, click, rub or gallop  Abdomen: soft, non-tender; bowel sounds normal; no masses,  no organomegaly, fundus not palpable  Wound : N/A  GU exam:  not indicated       Assessment:    There are no diagnoses linked to this encounter.  Normal postpartum exam.   Plan:   Essential components of care per ACOG recommendations:  1.  Mood and well being: Patient with negative depression screening today. She is feeling overwhelmed and somewhat anxious. Reviewed local resources for support.  - Patient tobacco use? No.     2. Infant care and feeding:  -Patient currently breastmilk feeding?  Yes. Discussed supporting milk supply  -Social determinants of health (SDOH) reviewed in EPIC. No concerns  3. Sexuality, contraception and birth spacing - Patient  does not want a pregnancy in the next year.  Desired family size is 2 children.  - Reviewed reproductive life planning. Reviewed contraceptive methods based on pt preferences and effectiveness.  Patient desired FAM or LAM today.   - Discussed birth spacing of 18 months  4. Sleep and fatigue -Encouraged family/partner/community support of 4 hrs of uninterrupted sleep to help with mood and fatigue  5. Physical Recovery  - Discussed patients delivery and complications. She describes her labor as good. - Patient had a  vaginal birth with no complications . Patient had no laceration. Perineal healing reviewed. Patient expressed understanding - Patient has  urinary incontinence? No. - Patient is safe to resume physical and sexual activity  6.  Health Maintenance - HM due items addressed Yes - Last pap smear  Diagnosis  Date Value Ref Range Status  05/02/2021   Final   - Negative for intraepithelial lesion or malignancy (NILM)   Pap smear not done at today's visit.  -Breast Cancer screening indicated? No.   7. Chronic Disease/Pregnancy Condition follow up: None  - PCP follow up  Will schedule appt for IUD placement when desired List of therapists sent via [place]. Reviewed methods of support and protected sleep.  Lurlean Horns, CNM [facility] [place], [place]

## 2022-03-02 IMAGING — US US PELVIS COMPLETE WITH TRANSVAGINAL
1 series · 14 of 25 positions shown · non-contrast
Comparison: None

CLINICAL DATA: Irregular menses.

EXAM:
TRANSABDOMINAL AND TRANSVAGINAL ULTRASOUND OF PELVIS
TECHNIQUE: Both transabdominal and transvaginal ultrasound examinations of the
pelvis were performed. Transabdominal technique was performed for
global imaging of the pelvis including uterus, ovaries, adnexal
regions, and pelvic cul-de-sac. It was necessary to proceed with
endovaginal exam following the transabdominal exam to visualize the
endometrium and ovaries.

[Series 1: us pelvic complete with transvaginal · 14 of 106 slices shown]
[im 1/106]
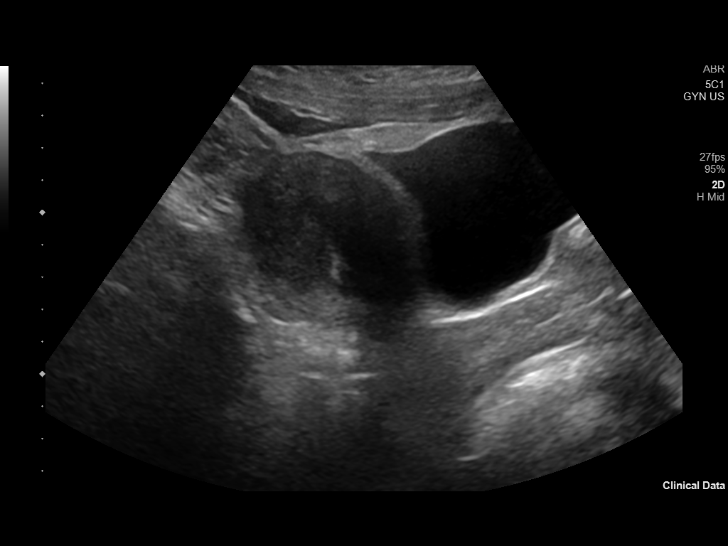
[im 9/106]
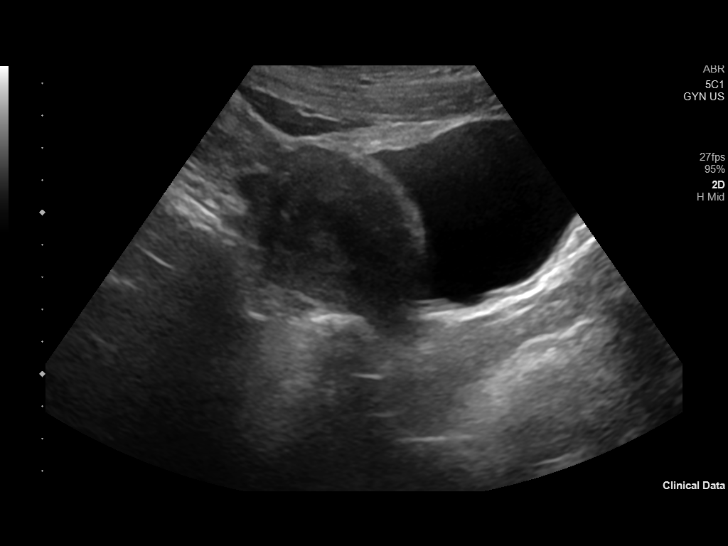
[im 18/106]
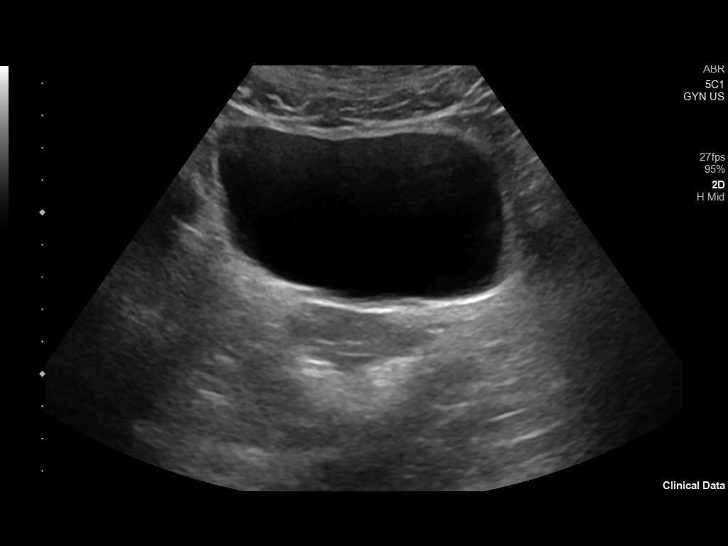
[im 27/106]
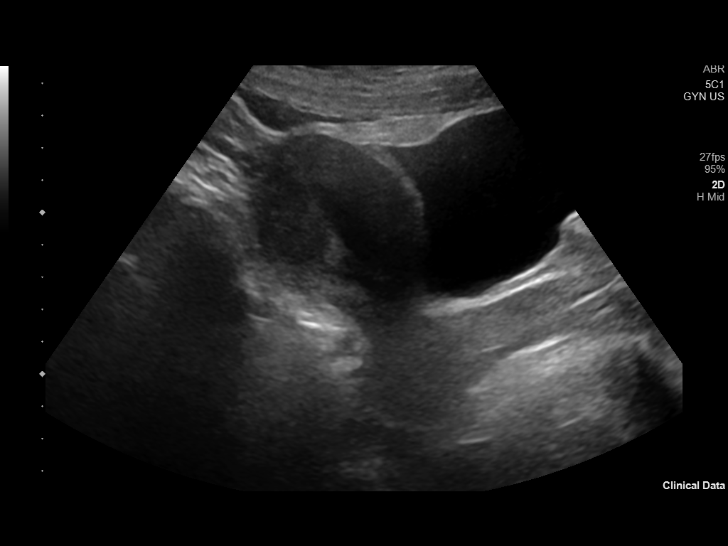
[im 36/106]
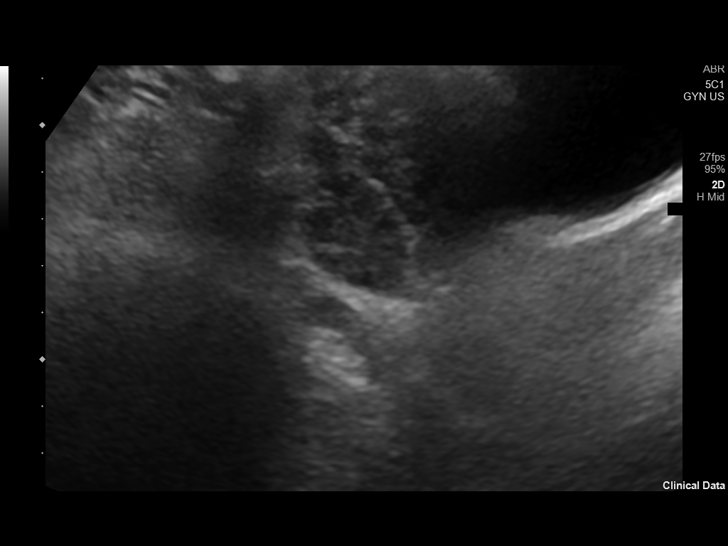
[im 40/106]
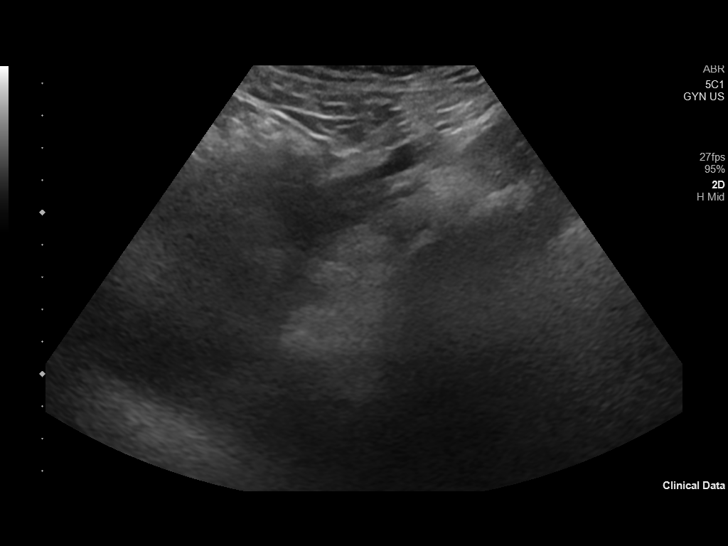
[im 49/106]
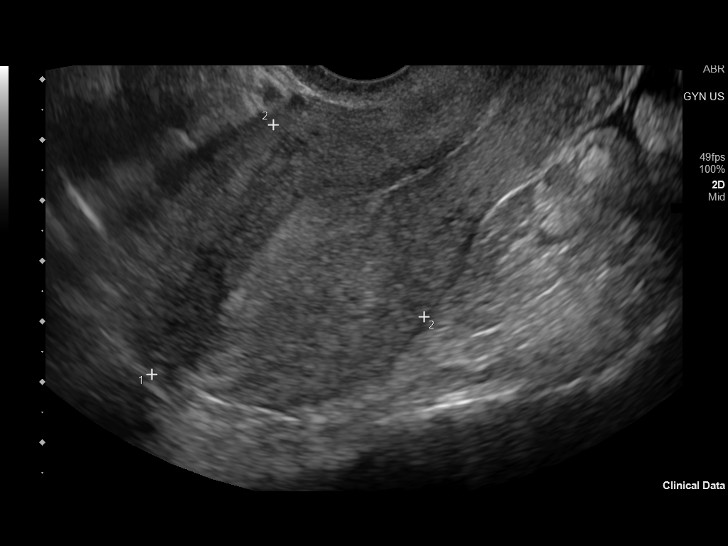
[im 57/106]
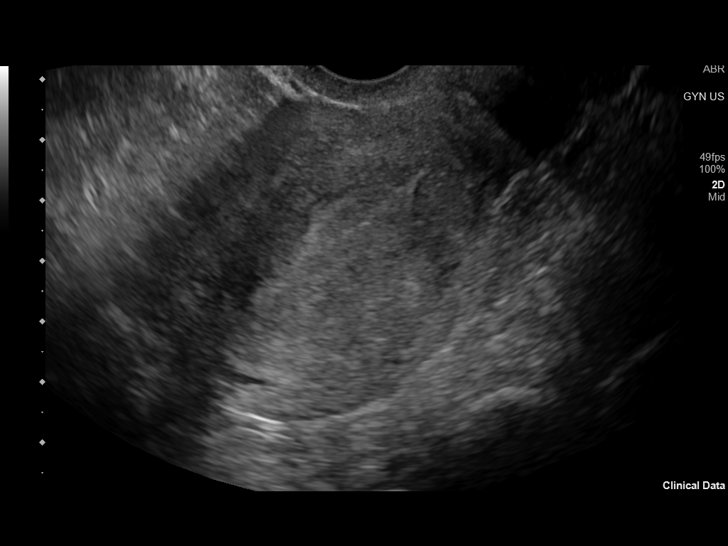
[im 66/106]
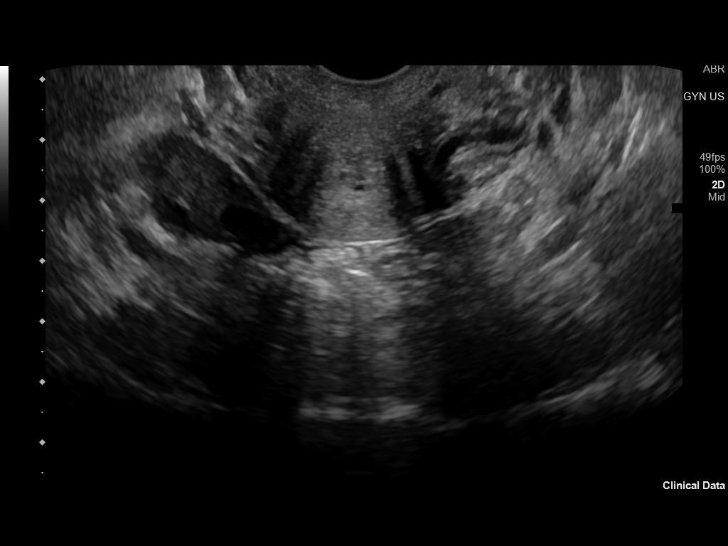
[im 71/106]
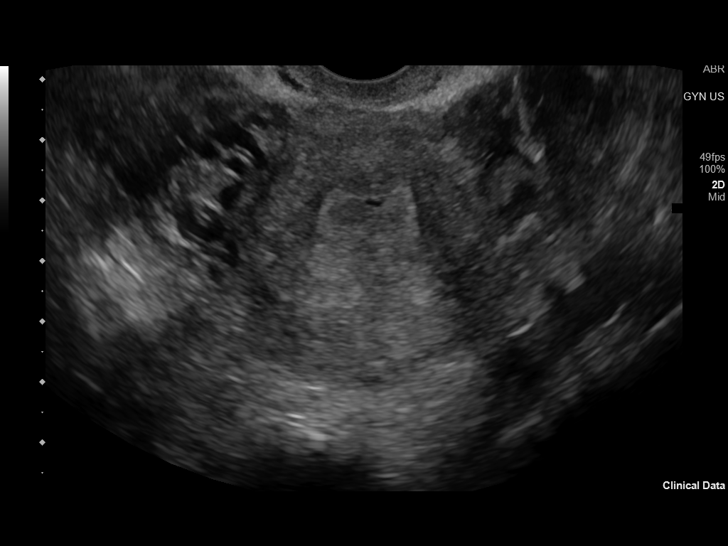
[im 79/106]
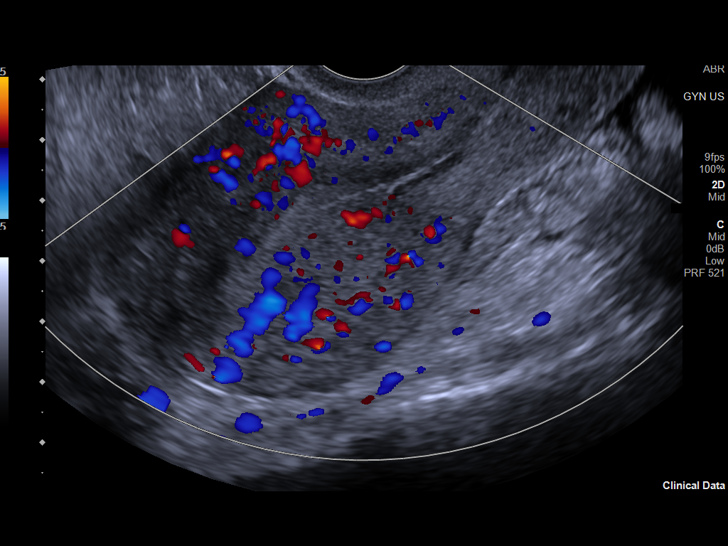
[im 88/106]
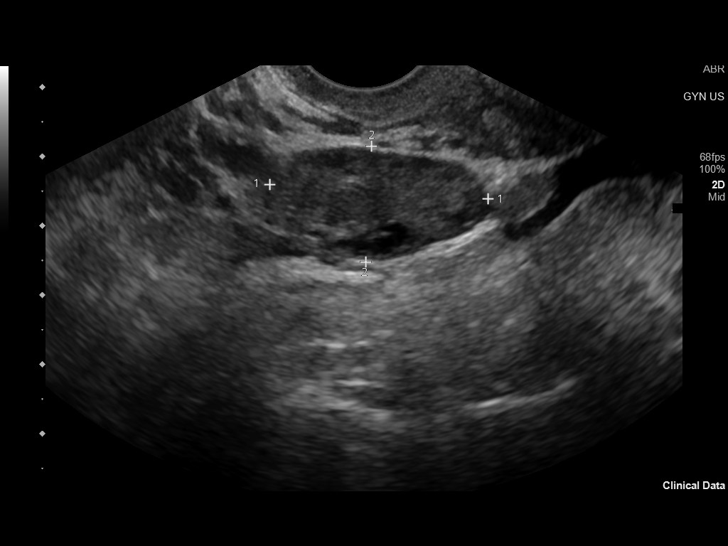
[im 97/106]
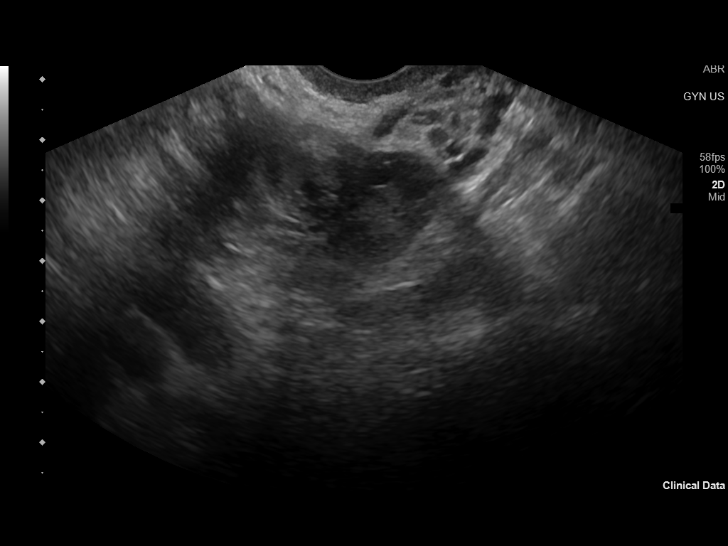
[im 106/106]
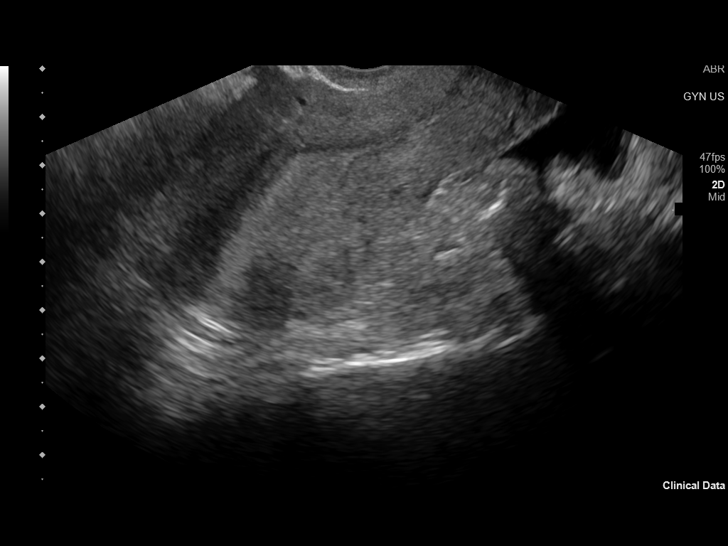

[14 of 25 positions shown; findings below may reference images not displayed]

FINDINGS: Uterus

Measurements: 9.9 x 4.0 x 5.5 cm = volume: 114 mL. No fibroids or
other mass visualized.

Endometrium

Thickness: 11 mm.  No focal abnormality visualized.

Right ovary

Measurements: 3.2 x 1.7 x 2.7 cm = volume: 7.4 mL. Normal
appearance/no adnexal mass.

Left ovary

Measurements: 2.6 x 2.1 x 1.5 cm = volume: 4.4 mL. Normal
appearance/no adnexal mass.

Other findings

Small free fluid within the pelvis.
IMPRESSION: Unremarkable pelvic ultrasound.

## 2022-09-05 LAB — OB RESULTS CONSOLE RPR: RPR: NONREACTIVE

## 2022-09-05 LAB — OB RESULTS CONSOLE HIV ANTIBODY (ROUTINE TESTING): HIV: NONREACTIVE

## 2022-09-05 LAB — OB RESULTS CONSOLE HEPATITIS B SURFACE ANTIGEN: Hepatitis B Surface Ag: NEGATIVE

## 2022-09-05 LAB — OB RESULTS CONSOLE ANTIBODY SCREEN: Antibody Screen: NEGATIVE

## 2022-09-05 LAB — OB RESULTS CONSOLE GC/CHLAMYDIA
Chlamydia: NEGATIVE
Neisseria Gonorrhea: NEGATIVE

## 2022-09-05 LAB — OB RESULTS CONSOLE RUBELLA ANTIBODY, IGM: Rubella: IMMUNE

## 2022-10-08 ENCOUNTER — Other Ambulatory Visit: Payer: Self-pay | Admitting: Obstetrics and Gynecology

## 2022-10-08 DIAGNOSIS — O99210 Obesity complicating pregnancy, unspecified trimester: Secondary | ICD-10-CM

## 2022-11-19 ENCOUNTER — Encounter: Payer: Self-pay | Admitting: *Deleted

## 2022-11-19 DIAGNOSIS — O99210 Obesity complicating pregnancy, unspecified trimester: Secondary | ICD-10-CM | POA: Insufficient documentation

## 2022-11-26 ENCOUNTER — Other Ambulatory Visit: Payer: Self-pay

## 2022-11-30 ENCOUNTER — Ambulatory Visit: Payer: Medicaid Other | Attending: Obstetrics and Gynecology

## 2022-11-30 ENCOUNTER — Ambulatory Visit: Payer: Medicaid Other | Attending: Obstetrics and Gynecology | Admitting: Obstetrics and Gynecology

## 2022-11-30 ENCOUNTER — Ambulatory Visit: Payer: Medicaid Other | Admitting: *Deleted

## 2022-11-30 ENCOUNTER — Other Ambulatory Visit: Payer: Self-pay | Admitting: *Deleted

## 2022-11-30 VITALS — BP 122/75 | HR 106

## 2022-11-30 DIAGNOSIS — O99212 Obesity complicating pregnancy, second trimester: Secondary | ICD-10-CM

## 2022-11-30 DIAGNOSIS — Q332 Sequestration of lung: Secondary | ICD-10-CM

## 2022-11-30 DIAGNOSIS — O43192 Other malformation of placenta, second trimester: Secondary | ICD-10-CM

## 2022-11-30 DIAGNOSIS — Z3A19 19 weeks gestation of pregnancy: Secondary | ICD-10-CM

## 2022-11-30 DIAGNOSIS — E669 Obesity, unspecified: Secondary | ICD-10-CM

## 2022-11-30 DIAGNOSIS — Z362 Encounter for other antenatal screening follow-up: Secondary | ICD-10-CM

## 2022-11-30 DIAGNOSIS — O35CXX0 Maternal care for other (suspected) fetal abnormality and damage, fetal pulmonary anomalies, not applicable or unspecified: Secondary | ICD-10-CM

## 2022-11-30 DIAGNOSIS — O99210 Obesity complicating pregnancy, unspecified trimester: Secondary | ICD-10-CM | POA: Insufficient documentation

## 2022-11-30 DIAGNOSIS — O35EXX0 Maternal care for other (suspected) fetal abnormality and damage, fetal genitourinary anomalies, not applicable or unspecified: Secondary | ICD-10-CM | POA: Diagnosis not present

## 2022-11-30 DIAGNOSIS — Z148 Genetic carrier of other disease: Secondary | ICD-10-CM

## 2022-11-30 DIAGNOSIS — O285 Abnormal chromosomal and genetic finding on antenatal screening of mother: Secondary | ICD-10-CM

## 2022-11-30 DIAGNOSIS — E6689 Other obesity not elsewhere classified: Secondary | ICD-10-CM

## 2022-11-30 NOTE — Progress Notes (Signed)
G3 P2002.  Patient is here for fetal anatomy scan.  On cell-free fetal DNA screening, the risks of fetal aneuploidies are not increased. Obstetrical history significant for 2 term vaginal deliveries and her most recent delivery was in November 2023. Patient reports no chronic medical conditions including hypertension or diabetes. Pregravid BMI is 45.  Ultrasound We performed a fetal anatomical survey.  Amniotic fluid is normal and good fetal activity seen.  Fetal biometry is consistent with the previously established dates.  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  It seems to derive its blood supply from the aorta. No evidence of pleural effusion or hydrops. -Cardiac axis appears normal.  Cardiac anatomy appears normal but evaluation could not be completed because of fetal position. -The congenital pulmonary airway malformation ratio (CVR) is 0.11 (good prognosis and low-likelihood of hydrops). 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 -Bilateral urinary tract dilations are seen; right pelvis measures 6 mm in the left 7 mm.  Both kidneys otherwise appear normal.  Fetal bladder appears normal.  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 Location of the mass and anomalous arterial supply from the aorta makes the diagnosis of BPS more likely. It is the second most-common prenatally-diagnosed lung lesion after CPAM. -I reassured the couple that fetal hydrops is less likely (CVR normal) and the heart is not shifted. -I counseled the couple that we recommend ultrasound every 2 weeks and check for development of hydrops and increase in size of the mass. -In the absence of fetal hydrops, it carries a good prognosis  with 100% survival.  -Usually, the newborns do not have respiratory complications unless complications of pleural effusion and significant increase in mass size is seen on subsequent scans. -I recommend delivery at [facility] unless hydrops develops or significant increase in size of the mass is seen. -Decrease in size or complete resolution of BPS is possible in some cases. -Postnatal evaluation will determine if surgery is required for excision.  -We will set up neonatology consultation in the third trimester if patient prefers to meet with our neonatologists. If hydrops develops or significant increase in size is seen, we will make referral to tertiary [facility]) center where full pediatric surgical facilities are available. -I recommended fetal echocardiography. -BPS is not associated with increased risk of chromosomal anomalies or genetic syndromes in the absence of other anomalies.   Bilateral urinary tract dilations Most UTD is resolved with advancing gestation and are only rarely associated with obstructive uropathy.  Given that she has low risk for fetal Down syndrome on cell-free fetal DNA screening, this finding does not increase the risk for Down syndrome.  Marginal cord insertion can be associated with fetal growth restriction in some cases.  Patient will be having serial fetal growth assessments.  Recommendations -Ultrasound every week 2 weeks to rule out hydrops. -Fetal growth assessments every 4 weeks. -Weekly antenatal testing from [redacted] weeks gestation till delivery. -Vaginal delivery is not contraindicated. -We have requested an appointment for fetal echocardiography

## 2022-12-11 ENCOUNTER — Other Ambulatory Visit: Payer: Self-pay

## 2022-12-11 ENCOUNTER — Ambulatory Visit: Payer: Medicaid Other | Attending: Obstetrics and Gynecology

## 2022-12-11 ENCOUNTER — Ambulatory Visit: Payer: Medicaid Other | Admitting: *Deleted

## 2022-12-11 VITALS — BP 125/76

## 2022-12-11 DIAGNOSIS — E669 Obesity, unspecified: Secondary | ICD-10-CM | POA: Diagnosis not present

## 2022-12-11 DIAGNOSIS — Z362 Encounter for other antenatal screening follow-up: Secondary | ICD-10-CM | POA: Insufficient documentation

## 2022-12-11 DIAGNOSIS — Z3A20 20 weeks gestation of pregnancy: Secondary | ICD-10-CM

## 2022-12-11 DIAGNOSIS — O285 Abnormal chromosomal and genetic finding on antenatal screening of mother: Secondary | ICD-10-CM

## 2022-12-11 DIAGNOSIS — O283 Abnormal ultrasonic finding on antenatal screening of mother: Secondary | ICD-10-CM

## 2022-12-11 DIAGNOSIS — O99212 Obesity complicating pregnancy, second trimester: Secondary | ICD-10-CM

## 2022-12-11 DIAGNOSIS — Z148 Genetic carrier of other disease: Secondary | ICD-10-CM

## 2022-12-11 DIAGNOSIS — O35CXX0 Maternal care for other (suspected) fetal abnormality and damage, fetal pulmonary anomalies, not applicable or unspecified: Secondary | ICD-10-CM

## 2022-12-21 ENCOUNTER — Encounter: Payer: Self-pay | Admitting: *Deleted

## 2022-12-21 DIAGNOSIS — Z148 Genetic carrier of other disease: Secondary | ICD-10-CM | POA: Insufficient documentation

## 2022-12-21 DIAGNOSIS — O09899 Supervision of other high risk pregnancies, unspecified trimester: Secondary | ICD-10-CM | POA: Insufficient documentation

## 2022-12-21 DIAGNOSIS — O35EXX0 Maternal care for other (suspected) fetal abnormality and damage, fetal genitourinary anomalies, not applicable or unspecified: Secondary | ICD-10-CM | POA: Insufficient documentation

## 2022-12-28 ENCOUNTER — Ambulatory Visit: Payer: Medicaid Other | Attending: Obstetrics and Gynecology | Admitting: *Deleted

## 2022-12-28 ENCOUNTER — Ambulatory Visit: Payer: Medicaid Other

## 2022-12-28 VITALS — BP 129/74 | HR 113

## 2022-12-28 DIAGNOSIS — O289 Unspecified abnormal findings on antenatal screening of mother: Secondary | ICD-10-CM | POA: Diagnosis present

## 2022-12-28 DIAGNOSIS — Z3A23 23 weeks gestation of pregnancy: Secondary | ICD-10-CM

## 2022-12-28 DIAGNOSIS — O35EXX0 Maternal care for other (suspected) fetal abnormality and damage, fetal genitourinary anomalies, not applicable or unspecified: Secondary | ICD-10-CM | POA: Diagnosis not present

## 2022-12-28 DIAGNOSIS — Z148 Genetic carrier of other disease: Secondary | ICD-10-CM | POA: Diagnosis not present

## 2022-12-28 DIAGNOSIS — O283 Abnormal ultrasonic finding on antenatal screening of mother: Secondary | ICD-10-CM

## 2022-12-28 DIAGNOSIS — Q332 Sequestration of lung: Secondary | ICD-10-CM | POA: Diagnosis not present

## 2022-12-28 DIAGNOSIS — O99212 Obesity complicating pregnancy, second trimester: Secondary | ICD-10-CM

## 2022-12-28 DIAGNOSIS — E669 Obesity, unspecified: Secondary | ICD-10-CM

## 2022-12-28 DIAGNOSIS — Z363 Encounter for antenatal screening for malformations: Secondary | ICD-10-CM | POA: Insufficient documentation

## 2022-12-28 DIAGNOSIS — Z362 Encounter for other antenatal screening follow-up: Secondary | ICD-10-CM

## 2023-01-04 LAB — HEPATITIS C ANTIBODY: HCV Ab: NEGATIVE

## 2023-01-11 ENCOUNTER — Ambulatory Visit (HOSPITAL_BASED_OUTPATIENT_CLINIC_OR_DEPARTMENT_OTHER): Payer: Medicaid Other | Admitting: Maternal & Fetal Medicine

## 2023-01-11 ENCOUNTER — Ambulatory Visit: Payer: Medicaid Other | Attending: Obstetrics and Gynecology | Admitting: *Deleted

## 2023-01-11 ENCOUNTER — Other Ambulatory Visit: Payer: Self-pay | Admitting: *Deleted

## 2023-01-11 ENCOUNTER — Ambulatory Visit: Payer: Medicaid Other

## 2023-01-11 VITALS — BP 126/82 | HR 102

## 2023-01-11 DIAGNOSIS — O321XX0 Maternal care for breech presentation, not applicable or unspecified: Secondary | ICD-10-CM | POA: Diagnosis not present

## 2023-01-11 DIAGNOSIS — Z362 Encounter for other antenatal screening follow-up: Secondary | ICD-10-CM | POA: Diagnosis present

## 2023-01-11 DIAGNOSIS — O99212 Obesity complicating pregnancy, second trimester: Secondary | ICD-10-CM | POA: Insufficient documentation

## 2023-01-11 DIAGNOSIS — Z3A25 25 weeks gestation of pregnancy: Secondary | ICD-10-CM

## 2023-01-11 DIAGNOSIS — O285 Abnormal chromosomal and genetic finding on antenatal screening of mother: Secondary | ICD-10-CM

## 2023-01-11 DIAGNOSIS — O283 Abnormal ultrasonic finding on antenatal screening of mother: Secondary | ICD-10-CM

## 2023-01-11 DIAGNOSIS — O35EXX0 Maternal care for other (suspected) fetal abnormality and damage, fetal genitourinary anomalies, not applicable or unspecified: Secondary | ICD-10-CM

## 2023-01-11 DIAGNOSIS — Z148 Genetic carrier of other disease: Secondary | ICD-10-CM

## 2023-01-11 DIAGNOSIS — E669 Obesity, unspecified: Secondary | ICD-10-CM

## 2023-01-11 DIAGNOSIS — O35CXX0 Maternal care for other (suspected) fetal abnormality and damage, fetal pulmonary anomalies, not applicable or unspecified: Secondary | ICD-10-CM | POA: Diagnosis not present

## 2023-01-11 DIAGNOSIS — Q332 Sequestration of lung: Secondary | ICD-10-CM | POA: Diagnosis not present

## 2023-01-11 NOTE — Progress Notes (Signed)
   Patient information  Patient Name: Heather Durham  Patient MRN:   [id number]  Referring practice: MFM Referring Provider: [facility] ([facility])  MFM CONSULT  Heather Durham is a 27 y.o. G3P2002 at [redacted]w[redacted]d here for ultrasound and consultation. 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 EDD: 04/26/2023 dated by LMP  (07/20/22).  Her pregnancy is complicated by a known BPS. She has no further concerns today.  I discussed the size of the bronchopulmonary sequestration is stable.  The CVR is well below the threshold to have concern for fetal hydrops (CVR > 1.6). The lesion will likely reach maximum size in the next few weeks and then tend to regress. We discussed the the possibility of postnatal imaging and resection.  I discussed the presence of bilateral UTD.  We discussed that this typically self resolves but if urinary tract dilation is still present a third trimester or is measuring over 1 cm then postnatal imaging, antibiotics, catheterization and surgery are typically needed.  Sonographic findings Single intrauterine pregnancy at 25w 0d.  Fetal cardiac activity:  Observed and appears normal. Presentation: Breech. Interval fetal anatomy appears normal except for a known right sided BPS and mild fetal UTD (0.75 and 0.86 mm).  -Mass width: 2.03 cm. Height: 1.77cm . Length: 2.87 cm. Head circumference: 24.4 cm. -Congenital Pulmonary Airway Malformation Volume =5.36 cm3 -Congenital Pulmonary Airway Malformation Volume Ratio (CVR) =0.22 cm2. Amniotic fluid volume: Subjectively increased. MVP: 6.61 cm. Placenta:  Fundal.  Recommendations - Continue limited US every 2 weeks to assess CVR and hydrops - Growth every 4 weeks  - Postnatal imaging for possible thoracic lesion as well as urinary tract dilation.  Refer to pediatric surgery as well as pediatric urology after birth.  Review of Systems: A review of systems was performed and was negative except per HPI   Vitals and Physical Exam    01/11/2023    9:22 AM 12/28/2022    3:24 PM 12/11/2022    9:17 AM  Vitals with BMI  Systolic 126 129 725  Diastolic 82 74 76  Pulse 102 113   Sitting comfortably on the sonogram table Nonlabored breathing Normal rate and rhythm Abdomen is nontender  Past pregnancies OB History  Gravida Para Term Preterm AB Living  3 2 2  0 0 2  SAB IAB Ectopic Multiple Live Births  0 0 0 0 2    # Outcome Date GA Lbr Len/2nd Weight Sex Type Anes PTL Lv  3 Current           2 Term 11/27/21 [redacted]w[redacted]d 30:15 / 00:15 3.28 kg F Vag-Spont EPI  LIV  1 Term 05/15/17 [redacted]w[redacted]d 02:49 / 00:27 4.11 kg F Vag-Spont EPI  LIV     I spent 45 minutes reviewing the patients chart, including labs and images as well as counseling the patient about her medical conditions. Greater than 50% of the time was spent in direct face-to-face patient counseling.  Braxton Feathers  MFM, [facility]   01/11/2023  10:34 AM

## 2023-01-16 NOTE — L&D Delivery Note (Signed)
 Delivery Note At 4:50 PM a viable female was delivered via Vaginal, Spontaneous (Presentation: Right Occiput Anterior).  APGAR: 8, 9; weight  pending.  Placenta status: Spontaneous, Intact.  Cord: 3 vessels with the following complications: None.  Cord pH: not collected. No nuchal cord noted.  Baby's nose and mouth suctioned at perineum.  Shoulders were delivered without difficulty, Left shoulder was anterior.  Upon delivery, baby was placed on mother's abdomen where it was dried and stimulated.  Pitocin was started at this point.  Baby had a spontaneous cry and good tone noted.  Cord was clamped after 1 minute. Cord cut by father of baby in the room.  Placenta delivered with gentle traction on cord and abdominal counter traction  Anesthesia: Epidural Episiotomy: None Lacerations: None Suture Repair:  N/A Est. Blood Loss (mL): 30  Mom to postpartum.  Baby to Couplet care / Skin to Skin.  Prescilla Sours, MD.  04/06/2023, 5:35 PM

## 2023-01-25 ENCOUNTER — Ambulatory Visit: Payer: Medicaid Other | Admitting: *Deleted

## 2023-01-25 ENCOUNTER — Other Ambulatory Visit: Payer: Self-pay

## 2023-01-25 ENCOUNTER — Ambulatory Visit: Payer: Medicaid Other | Attending: Obstetrics and Gynecology

## 2023-01-25 ENCOUNTER — Other Ambulatory Visit: Payer: Self-pay | Admitting: *Deleted

## 2023-01-25 VITALS — BP 125/63 | HR 98

## 2023-01-25 DIAGNOSIS — O35CXX0 Maternal care for other (suspected) fetal abnormality and damage, fetal pulmonary anomalies, not applicable or unspecified: Secondary | ICD-10-CM

## 2023-01-25 DIAGNOSIS — O35EXX0 Maternal care for other (suspected) fetal abnormality and damage, fetal genitourinary anomalies, not applicable or unspecified: Secondary | ICD-10-CM

## 2023-01-25 DIAGNOSIS — O99212 Obesity complicating pregnancy, second trimester: Secondary | ICD-10-CM

## 2023-01-25 DIAGNOSIS — E669 Obesity, unspecified: Secondary | ICD-10-CM

## 2023-01-25 DIAGNOSIS — Z362 Encounter for other antenatal screening follow-up: Secondary | ICD-10-CM | POA: Diagnosis present

## 2023-01-25 DIAGNOSIS — Z148 Genetic carrier of other disease: Secondary | ICD-10-CM

## 2023-01-25 DIAGNOSIS — Z3A27 27 weeks gestation of pregnancy: Secondary | ICD-10-CM

## 2023-02-08 ENCOUNTER — Other Ambulatory Visit: Payer: Self-pay

## 2023-02-08 ENCOUNTER — Ambulatory Visit: Payer: Medicaid Other | Attending: Maternal & Fetal Medicine

## 2023-02-08 ENCOUNTER — Ambulatory Visit: Payer: Medicaid Other | Admitting: *Deleted

## 2023-02-08 VITALS — BP 125/75 | HR 105

## 2023-02-08 DIAGNOSIS — O99212 Obesity complicating pregnancy, second trimester: Secondary | ICD-10-CM | POA: Diagnosis not present

## 2023-02-08 DIAGNOSIS — Z148 Genetic carrier of other disease: Secondary | ICD-10-CM

## 2023-02-08 DIAGNOSIS — O35EXX0 Maternal care for other (suspected) fetal abnormality and damage, fetal genitourinary anomalies, not applicable or unspecified: Secondary | ICD-10-CM | POA: Insufficient documentation

## 2023-02-08 DIAGNOSIS — O99213 Obesity complicating pregnancy, third trimester: Secondary | ICD-10-CM | POA: Diagnosis present

## 2023-02-08 DIAGNOSIS — O35CXX0 Maternal care for other (suspected) fetal abnormality and damage, fetal pulmonary anomalies, not applicable or unspecified: Secondary | ICD-10-CM

## 2023-02-08 DIAGNOSIS — E669 Obesity, unspecified: Secondary | ICD-10-CM

## 2023-02-08 DIAGNOSIS — O285 Abnormal chromosomal and genetic finding on antenatal screening of mother: Secondary | ICD-10-CM

## 2023-02-08 DIAGNOSIS — O283 Abnormal ultrasonic finding on antenatal screening of mother: Secondary | ICD-10-CM | POA: Insufficient documentation

## 2023-02-08 DIAGNOSIS — Z3A29 29 weeks gestation of pregnancy: Secondary | ICD-10-CM

## 2023-02-22 ENCOUNTER — Ambulatory Visit: Payer: Medicaid Other | Admitting: *Deleted

## 2023-02-22 ENCOUNTER — Ambulatory Visit: Payer: Medicaid Other | Attending: Maternal & Fetal Medicine

## 2023-02-22 ENCOUNTER — Other Ambulatory Visit: Payer: Self-pay | Admitting: *Deleted

## 2023-02-22 VITALS — BP 122/79 | HR 111

## 2023-02-22 DIAGNOSIS — O3663X0 Maternal care for excessive fetal growth, third trimester, not applicable or unspecified: Secondary | ICD-10-CM

## 2023-02-22 DIAGNOSIS — O35EXX0 Maternal care for other (suspected) fetal abnormality and damage, fetal genitourinary anomalies, not applicable or unspecified: Secondary | ICD-10-CM | POA: Insufficient documentation

## 2023-02-22 DIAGNOSIS — E669 Obesity, unspecified: Secondary | ICD-10-CM

## 2023-02-22 DIAGNOSIS — O99213 Obesity complicating pregnancy, third trimester: Secondary | ICD-10-CM

## 2023-02-22 DIAGNOSIS — O283 Abnormal ultrasonic finding on antenatal screening of mother: Secondary | ICD-10-CM | POA: Insufficient documentation

## 2023-02-22 DIAGNOSIS — Q330 Congenital cystic lung: Secondary | ICD-10-CM

## 2023-02-22 DIAGNOSIS — Z3A31 31 weeks gestation of pregnancy: Secondary | ICD-10-CM

## 2023-02-22 DIAGNOSIS — Z148 Genetic carrier of other disease: Secondary | ICD-10-CM

## 2023-02-22 DIAGNOSIS — O35CXX0 Maternal care for other (suspected) fetal abnormality and damage, fetal pulmonary anomalies, not applicable or unspecified: Secondary | ICD-10-CM | POA: Diagnosis not present

## 2023-03-01 ENCOUNTER — Other Ambulatory Visit: Payer: Self-pay

## 2023-03-01 ENCOUNTER — Ambulatory Visit: Payer: Medicaid Other | Attending: Obstetrics and Gynecology

## 2023-03-01 DIAGNOSIS — O35CXX0 Maternal care for other (suspected) fetal abnormality and damage, fetal pulmonary anomalies, not applicable or unspecified: Secondary | ICD-10-CM

## 2023-03-01 DIAGNOSIS — O3663X0 Maternal care for excessive fetal growth, third trimester, not applicable or unspecified: Secondary | ICD-10-CM | POA: Diagnosis not present

## 2023-03-01 DIAGNOSIS — E669 Obesity, unspecified: Secondary | ICD-10-CM | POA: Diagnosis not present

## 2023-03-01 DIAGNOSIS — O99213 Obesity complicating pregnancy, third trimester: Secondary | ICD-10-CM | POA: Diagnosis not present

## 2023-03-01 DIAGNOSIS — O35EXX0 Maternal care for other (suspected) fetal abnormality and damage, fetal genitourinary anomalies, not applicable or unspecified: Secondary | ICD-10-CM | POA: Diagnosis present

## 2023-03-01 DIAGNOSIS — Z148 Genetic carrier of other disease: Secondary | ICD-10-CM

## 2023-03-01 DIAGNOSIS — Z3A32 32 weeks gestation of pregnancy: Secondary | ICD-10-CM

## 2023-03-08 ENCOUNTER — Ambulatory Visit: Payer: Medicaid Other | Attending: Obstetrics and Gynecology

## 2023-03-08 ENCOUNTER — Ambulatory Visit: Payer: Medicaid Other

## 2023-03-08 DIAGNOSIS — O99213 Obesity complicating pregnancy, third trimester: Secondary | ICD-10-CM | POA: Diagnosis not present

## 2023-03-08 DIAGNOSIS — O3663X0 Maternal care for excessive fetal growth, third trimester, not applicable or unspecified: Secondary | ICD-10-CM

## 2023-03-08 DIAGNOSIS — O35CXX0 Maternal care for other (suspected) fetal abnormality and damage, fetal pulmonary anomalies, not applicable or unspecified: Secondary | ICD-10-CM | POA: Diagnosis not present

## 2023-03-08 DIAGNOSIS — O35EXX0 Maternal care for other (suspected) fetal abnormality and damage, fetal genitourinary anomalies, not applicable or unspecified: Secondary | ICD-10-CM | POA: Insufficient documentation

## 2023-03-08 DIAGNOSIS — Z148 Genetic carrier of other disease: Secondary | ICD-10-CM

## 2023-03-08 DIAGNOSIS — O285 Abnormal chromosomal and genetic finding on antenatal screening of mother: Secondary | ICD-10-CM

## 2023-03-08 DIAGNOSIS — Z3A33 33 weeks gestation of pregnancy: Secondary | ICD-10-CM

## 2023-03-08 DIAGNOSIS — E669 Obesity, unspecified: Secondary | ICD-10-CM | POA: Diagnosis not present

## 2023-03-15 ENCOUNTER — Ambulatory Visit: Payer: Medicaid Other

## 2023-03-15 ENCOUNTER — Ambulatory Visit: Payer: Medicaid Other | Attending: Obstetrics and Gynecology | Admitting: *Deleted

## 2023-03-15 VITALS — BP 130/69 | HR 105

## 2023-03-15 DIAGNOSIS — E669 Obesity, unspecified: Secondary | ICD-10-CM

## 2023-03-15 DIAGNOSIS — O35EXX0 Maternal care for other (suspected) fetal abnormality and damage, fetal genitourinary anomalies, not applicable or unspecified: Secondary | ICD-10-CM | POA: Insufficient documentation

## 2023-03-15 DIAGNOSIS — O3663X0 Maternal care for excessive fetal growth, third trimester, not applicable or unspecified: Secondary | ICD-10-CM | POA: Insufficient documentation

## 2023-03-15 DIAGNOSIS — O99213 Obesity complicating pregnancy, third trimester: Secondary | ICD-10-CM

## 2023-03-15 DIAGNOSIS — O283 Abnormal ultrasonic finding on antenatal screening of mother: Secondary | ICD-10-CM | POA: Diagnosis not present

## 2023-03-15 DIAGNOSIS — Q332 Sequestration of lung: Secondary | ICD-10-CM | POA: Diagnosis not present

## 2023-03-15 DIAGNOSIS — Q330 Congenital cystic lung: Secondary | ICD-10-CM

## 2023-03-15 DIAGNOSIS — Z3A34 34 weeks gestation of pregnancy: Secondary | ICD-10-CM | POA: Diagnosis not present

## 2023-03-15 DIAGNOSIS — Z148 Genetic carrier of other disease: Secondary | ICD-10-CM

## 2023-03-22 ENCOUNTER — Ambulatory Visit: Payer: Medicaid Other

## 2023-03-22 ENCOUNTER — Ambulatory Visit: Payer: Medicaid Other | Attending: Obstetrics and Gynecology | Admitting: *Deleted

## 2023-03-22 VITALS — BP 115/73 | HR 102

## 2023-03-22 DIAGNOSIS — Q330 Congenital cystic lung: Secondary | ICD-10-CM | POA: Diagnosis present

## 2023-03-22 DIAGNOSIS — O289 Unspecified abnormal findings on antenatal screening of mother: Secondary | ICD-10-CM | POA: Diagnosis not present

## 2023-03-22 DIAGNOSIS — O35CXX0 Maternal care for other (suspected) fetal abnormality and damage, fetal pulmonary anomalies, not applicable or unspecified: Secondary | ICD-10-CM | POA: Insufficient documentation

## 2023-03-22 DIAGNOSIS — O09893 Supervision of other high risk pregnancies, third trimester: Secondary | ICD-10-CM | POA: Insufficient documentation

## 2023-03-22 DIAGNOSIS — O99213 Obesity complicating pregnancy, third trimester: Secondary | ICD-10-CM | POA: Insufficient documentation

## 2023-03-22 DIAGNOSIS — O3663X0 Maternal care for excessive fetal growth, third trimester, not applicable or unspecified: Secondary | ICD-10-CM | POA: Insufficient documentation

## 2023-03-22 DIAGNOSIS — Z3A35 35 weeks gestation of pregnancy: Secondary | ICD-10-CM | POA: Diagnosis not present

## 2023-03-22 DIAGNOSIS — E669 Obesity, unspecified: Secondary | ICD-10-CM | POA: Diagnosis not present

## 2023-03-22 DIAGNOSIS — O283 Abnormal ultrasonic finding on antenatal screening of mother: Secondary | ICD-10-CM | POA: Diagnosis not present

## 2023-03-22 DIAGNOSIS — Z148 Genetic carrier of other disease: Secondary | ICD-10-CM | POA: Diagnosis not present

## 2023-03-22 DIAGNOSIS — O285 Abnormal chromosomal and genetic finding on antenatal screening of mother: Secondary | ICD-10-CM

## 2023-03-22 DIAGNOSIS — O35EXX0 Maternal care for other (suspected) fetal abnormality and damage, fetal genitourinary anomalies, not applicable or unspecified: Secondary | ICD-10-CM

## 2023-03-28 ENCOUNTER — Telehealth: Payer: Self-pay

## 2023-03-29 ENCOUNTER — Ambulatory Visit: Attending: Obstetrics and Gynecology | Admitting: *Deleted

## 2023-03-29 ENCOUNTER — Ambulatory Visit: Payer: Medicaid Other

## 2023-03-29 ENCOUNTER — Ambulatory Visit: Admitting: *Deleted

## 2023-03-29 VITALS — BP 119/75 | HR 105

## 2023-03-29 DIAGNOSIS — O99213 Obesity complicating pregnancy, third trimester: Secondary | ICD-10-CM | POA: Diagnosis not present

## 2023-03-29 DIAGNOSIS — R222 Localized swelling, mass and lump, trunk: Secondary | ICD-10-CM | POA: Diagnosis present

## 2023-03-29 DIAGNOSIS — O09893 Supervision of other high risk pregnancies, third trimester: Secondary | ICD-10-CM | POA: Insufficient documentation

## 2023-03-29 DIAGNOSIS — Z3A36 36 weeks gestation of pregnancy: Secondary | ICD-10-CM | POA: Insufficient documentation

## 2023-03-29 DIAGNOSIS — O283 Abnormal ultrasonic finding on antenatal screening of mother: Secondary | ICD-10-CM

## 2023-03-29 DIAGNOSIS — Q332 Sequestration of lung: Secondary | ICD-10-CM | POA: Insufficient documentation

## 2023-03-29 DIAGNOSIS — O3660X0 Maternal care for excessive fetal growth, unspecified trimester, not applicable or unspecified: Secondary | ICD-10-CM | POA: Diagnosis not present

## 2023-03-29 DIAGNOSIS — Z148 Genetic carrier of other disease: Secondary | ICD-10-CM | POA: Insufficient documentation

## 2023-03-29 DIAGNOSIS — O35EXX0 Maternal care for other (suspected) fetal abnormality and damage, fetal genitourinary anomalies, not applicable or unspecified: Secondary | ICD-10-CM | POA: Insufficient documentation

## 2023-03-29 NOTE — Procedures (Signed)
 Heather Durham 1995/10/19 [redacted]w[redacted]d  Fetus A Non-Stress Test Interpretation for 03/29/23- NST only  Indication:  chest mass  Fetal Heart Rate A Mode: External Baseline Rate (A): 135 bpm Variability: Moderate Accelerations: 15 x 15 Decelerations: None Multiple birth?: No  Uterine Activity Mode: Toco Contraction Frequency (min): none Resting Tone Palpated: Relaxed  Interpretation (Fetal Testing) Nonstress Test Interpretation: Reactive Comments: Tracing reviewed by Dr. Judeth Cornfield

## 2023-04-02 ENCOUNTER — Other Ambulatory Visit: Payer: Self-pay | Admitting: Obstetrics and Gynecology

## 2023-04-04 LAB — OB RESULTS CONSOLE GBS: GBS: NEGATIVE

## 2023-04-05 ENCOUNTER — Inpatient Hospital Stay (HOSPITAL_COMMUNITY)

## 2023-04-05 ENCOUNTER — Ambulatory Visit: Payer: Medicaid Other

## 2023-04-05 ENCOUNTER — Encounter (HOSPITAL_COMMUNITY): Payer: Self-pay | Admitting: Obstetrics & Gynecology

## 2023-04-05 ENCOUNTER — Inpatient Hospital Stay (HOSPITAL_COMMUNITY)
Admission: AD | Admit: 2023-04-05 | Discharge: 2023-04-08 | DRG: 807 | Disposition: A | Discharge status: Home / Self Care | Attending: Obstetrics & Gynecology | Admitting: Obstetrics & Gynecology

## 2023-04-05 ENCOUNTER — Telehealth (HOSPITAL_COMMUNITY): Payer: Self-pay | Admitting: *Deleted

## 2023-04-05 ENCOUNTER — Encounter (HOSPITAL_COMMUNITY): Payer: Self-pay | Admitting: *Deleted

## 2023-04-05 ENCOUNTER — Other Ambulatory Visit: Payer: Self-pay

## 2023-04-05 ENCOUNTER — Encounter (HOSPITAL_COMMUNITY): Payer: Self-pay

## 2023-04-05 ENCOUNTER — Ambulatory Visit (HOSPITAL_BASED_OUTPATIENT_CLINIC_OR_DEPARTMENT_OTHER): Admitting: Obstetrics and Gynecology

## 2023-04-05 ENCOUNTER — Ambulatory Visit: Payer: Medicaid Other | Admitting: *Deleted

## 2023-04-05 VITALS — BP 133/88 | HR 106

## 2023-04-05 DIAGNOSIS — O99213 Obesity complicating pregnancy, third trimester: Secondary | ICD-10-CM | POA: Insufficient documentation

## 2023-04-05 DIAGNOSIS — O35CXX0 Maternal care for other (suspected) fetal abnormality and damage, fetal pulmonary anomalies, not applicable or unspecified: Secondary | ICD-10-CM | POA: Diagnosis present

## 2023-04-05 DIAGNOSIS — O9902 Anemia complicating childbirth: Secondary | ICD-10-CM | POA: Diagnosis present

## 2023-04-05 DIAGNOSIS — Z3A37 37 weeks gestation of pregnancy: Principal | ICD-10-CM

## 2023-04-05 DIAGNOSIS — Z833 Family history of diabetes mellitus: Secondary | ICD-10-CM | POA: Diagnosis not present

## 2023-04-05 DIAGNOSIS — Z7982 Long term (current) use of aspirin: Secondary | ICD-10-CM | POA: Diagnosis not present

## 2023-04-05 DIAGNOSIS — O134 Gestational [pregnancy-induced] hypertension without significant proteinuria, complicating childbirth: Principal | ICD-10-CM | POA: Diagnosis present

## 2023-04-05 DIAGNOSIS — Z9289 Personal history of other medical treatment: Secondary | ICD-10-CM

## 2023-04-05 DIAGNOSIS — K219 Gastro-esophageal reflux disease without esophagitis: Secondary | ICD-10-CM | POA: Diagnosis present

## 2023-04-05 DIAGNOSIS — O43193 Other malformation of placenta, third trimester: Secondary | ICD-10-CM | POA: Diagnosis present

## 2023-04-05 DIAGNOSIS — O99214 Obesity complicating childbirth: Secondary | ICD-10-CM | POA: Diagnosis present

## 2023-04-05 DIAGNOSIS — E669 Obesity, unspecified: Secondary | ICD-10-CM | POA: Diagnosis not present

## 2023-04-05 DIAGNOSIS — Z148 Genetic carrier of other disease: Secondary | ICD-10-CM | POA: Diagnosis not present

## 2023-04-05 DIAGNOSIS — O283 Abnormal ultrasonic finding on antenatal screening of mother: Secondary | ICD-10-CM

## 2023-04-05 DIAGNOSIS — O3663X0 Maternal care for excessive fetal growth, third trimester, not applicable or unspecified: Secondary | ICD-10-CM | POA: Diagnosis present

## 2023-04-05 DIAGNOSIS — O26893 Other specified pregnancy related conditions, third trimester: Secondary | ICD-10-CM

## 2023-04-05 DIAGNOSIS — O9962 Diseases of the digestive system complicating childbirth: Secondary | ICD-10-CM | POA: Diagnosis present

## 2023-04-05 DIAGNOSIS — O133 Gestational [pregnancy-induced] hypertension without significant proteinuria, third trimester: Secondary | ICD-10-CM

## 2023-04-05 DIAGNOSIS — Q332 Sequestration of lung: Secondary | ICD-10-CM | POA: Insufficient documentation

## 2023-04-05 DIAGNOSIS — E6689 Other obesity not elsewhere classified: Secondary | ICD-10-CM | POA: Diagnosis not present

## 2023-04-05 DIAGNOSIS — R222 Localized swelling, mass and lump, trunk: Secondary | ICD-10-CM | POA: Diagnosis not present

## 2023-04-05 DIAGNOSIS — O35EXX0 Maternal care for other (suspected) fetal abnormality and damage, fetal genitourinary anomalies, not applicable or unspecified: Secondary | ICD-10-CM | POA: Insufficient documentation

## 2023-04-05 DIAGNOSIS — O139 Gestational [pregnancy-induced] hypertension without significant proteinuria, unspecified trimester: Secondary | ICD-10-CM

## 2023-04-05 DIAGNOSIS — Q330 Congenital cystic lung: Secondary | ICD-10-CM

## 2023-04-05 LAB — COMPREHENSIVE METABOLIC PANEL
ALT: 13 U/L (ref 0–44)
AST: 15 U/L (ref 15–41)
Albumin: 2.5 g/dL — ABNORMAL LOW (ref 3.5–5.0)
Alkaline Phosphatase: 115 U/L (ref 38–126)
Anion gap: 12 (ref 5–15)
BUN: 5 mg/dL — ABNORMAL LOW (ref 6–20)
CO2: 21 mmol/L — ABNORMAL LOW (ref 22–32)
Calcium: 9.5 mg/dL (ref 8.9–10.3)
Chloride: 106 mmol/L (ref 98–111)
Creatinine, Ser: 0.69 mg/dL (ref 0.44–1.00)
GFR, Estimated: >60 mL/min (ref >60)
Glucose, Bld: 125 mg/dL — ABNORMAL HIGH (ref 70–99)
Potassium: 4 mmol/L (ref 3.5–5.1)
Sodium: 139 mmol/L (ref 135–145)
Total Bilirubin: 0.3 mg/dL (ref 0.0–1.2)
Total Protein: 6.9 g/dL (ref 6.5–8.1)

## 2023-04-05 LAB — PROTEIN / CREATININE RATIO, URINE
Creatinine, Urine: 196 mg/dL
Protein Creatinine Ratio: 0.08 mg/mg{creat} (ref 0.00–0.15)
Total Protein, Urine: 15 mg/dL

## 2023-04-05 LAB — CBC
HCT: 34.8 % — ABNORMAL LOW (ref 36.0–46.0)
Hemoglobin: 10.8 g/dL — ABNORMAL LOW (ref 12.0–15.0)
MCH: 27.1 pg (ref 26.0–34.0)
MCHC: 31 g/dL (ref 30.0–36.0)
MCV: 87.4 fL (ref 80.0–100.0)
Platelets: 275 10*3/uL (ref 150–400)
RBC: 3.98 MIL/uL (ref 3.87–5.11)
RDW: 16.1 % — ABNORMAL HIGH (ref 11.5–15.5)
WBC: 11.6 10*3/uL — ABNORMAL HIGH (ref 4.0–10.5)
nRBC: 0 % (ref 0.0–0.2)

## 2023-04-05 LAB — TYPE AND SCREEN
ABO/RH(D): O POS
Antibody Screen: NEGATIVE

## 2023-04-05 MED ORDER — LACTATED RINGERS IV SOLN
500.0000 mL | Freq: Once | INTRAVENOUS | Status: AC
  Administered 2023-04-06: 500 mL via INTRAVENOUS

## 2023-04-05 MED ORDER — HYDRALAZINE HCL 20 MG/ML IJ SOLN: 10.0000 mg | INTRAMUSCULAR | Status: DC | PRN

## 2023-04-05 MED ORDER — LIDOCAINE HCL (PF) 1 % IJ SOLN: 30.0000 mL | INTRAMUSCULAR | Status: DC | PRN

## 2023-04-05 MED ORDER — FLEET ENEMA RE ENEM: 1.0000 | ENEMA | RECTAL | Status: DC | PRN

## 2023-04-05 MED ORDER — FENTANYL-BUPIVACAINE-NACL 0.5-0.125-0.9 MG/250ML-% EP SOLN
12.0000 mL/h | EPIDURAL | Status: DC | PRN
  Administered 2023-04-06: 12 mL/h via EPIDURAL
  Filled 2023-04-05: qty 250

## 2023-04-05 MED ORDER — SOD CITRATE-CITRIC ACID 500-334 MG/5ML PO SOLN
30.0000 mL | ORAL | Status: DC | PRN
  Administered 2023-04-06: 30 mL via ORAL
  Filled 2023-04-05: qty 30

## 2023-04-05 MED ORDER — PHENYLEPHRINE 80 MCG/ML (10ML) SYRINGE FOR IV PUSH (FOR BLOOD PRESSURE SUPPORT)
80.0000 ug | PREFILLED_SYRINGE | INTRAVENOUS | Status: DC | PRN
  Administered 2023-04-06: 160 ug via INTRAVENOUS
  Administered 2023-04-06: 80 ug via INTRAVENOUS

## 2023-04-05 MED ORDER — FENTANYL CITRATE (PF) 100 MCG/2ML IJ SOLN
50.0000 ug | INTRAMUSCULAR | Status: DC | PRN
Start: 2023-04-05 — End: 2023-04-06
  Administered 2023-04-06: 100 ug via INTRAVENOUS
  Filled 2023-04-05: qty 2

## 2023-04-05 MED ORDER — PHENYLEPHRINE 80 MCG/ML (10ML) SYRINGE FOR IV PUSH (FOR BLOOD PRESSURE SUPPORT)
80.0000 ug | PREFILLED_SYRINGE | INTRAVENOUS | Status: DC | PRN
  Filled 2023-04-05: qty 10

## 2023-04-05 MED ORDER — LACTATED RINGERS IV SOLN: INTRAVENOUS | Status: DC

## 2023-04-05 MED ORDER — EPHEDRINE 5 MG/ML INJ
10.0000 mg | INTRAVENOUS | Status: DC | PRN
  Administered 2023-04-06: 10 mg via INTRAVENOUS

## 2023-04-05 MED ORDER — ACETAMINOPHEN 325 MG PO TABS: 650.0000 mg | ORAL_TABLET | ORAL | Status: DC | PRN

## 2023-04-05 MED ORDER — OXYTOCIN BOLUS FROM INFUSION
333.0000 mL | Freq: Once | INTRAVENOUS | Status: AC
  Administered 2023-04-06: 333 mL via INTRAVENOUS

## 2023-04-05 MED ORDER — TERBUTALINE SULFATE 1 MG/ML IJ SOLN: 0.2500 mg | Freq: Once | INTRAMUSCULAR | Status: DC | PRN

## 2023-04-05 MED ORDER — LABETALOL HCL 5 MG/ML IV SOLN: 80.0000 mg | INTRAVENOUS | Status: DC | PRN

## 2023-04-05 MED ORDER — OXYTOCIN-SODIUM CHLORIDE 30-0.9 UT/500ML-% IV SOLN
2.5000 [IU]/h | INTRAVENOUS | Status: DC
  Administered 2023-04-06: 2.5 [IU]/h via INTRAVENOUS

## 2023-04-05 MED ORDER — DIPHENHYDRAMINE HCL 50 MG/ML IJ SOLN: 12.5000 mg | INTRAMUSCULAR | Status: DC | PRN

## 2023-04-05 MED ORDER — ONDANSETRON HCL 4 MG/2ML IJ SOLN: 4.0000 mg | Freq: Four times a day (QID) | INTRAMUSCULAR | Status: DC | PRN

## 2023-04-05 MED ORDER — LABETALOL HCL 5 MG/ML IV SOLN: 40.0000 mg | INTRAVENOUS | Status: DC | PRN

## 2023-04-05 MED ORDER — EPHEDRINE 5 MG/ML INJ
10.0000 mg | INTRAVENOUS | Status: DC | PRN
  Filled 2023-04-05: qty 5

## 2023-04-05 MED ORDER — LABETALOL HCL 5 MG/ML IV SOLN: 20.0000 mg | INTRAVENOUS | Status: DC | PRN

## 2023-04-05 MED ORDER — LACTATED RINGERS IV SOLN: 500.0000 mL | INTRAVENOUS | Status: DC | PRN

## 2023-04-05 NOTE — MAU Note (Signed)
 Pt sent from MFM for 4/8 BPP. Pt reports good fetal movement denies any pain or cramping bleeding or leaking at this time

## 2023-04-05 NOTE — H&P (Signed)
 OB ADMISSION/ HISTORY & PHYSICAL:  Admission Date: 04/05/2023 12:53 PM  Admit Diagnosis: Gestational hypertension  Heather Durham is a 28 y.o. female G3P2002 [redacted]w[redacted]d presenting to MAU from MFM for a 4/8 BPP. Dr. Judeth Cornfield recommended extended monitoring, repeat BPP, and IOL for BPP less than 10/10. Repeat BPP 8/10 today. Pt met criteria for gestational hypertension while in MAU. Protein creatinine ratio of 0.08. Endorses active FM, denies LOF and vaginal bleeding.   Pregnancy risks: Intralobular Bronchopulmonary Sequestration     -normal fetal echo Marginal cord insertion LGA 98% (hx of 9# baby) SMA carrier  History of current pregnancy: [id number]   Patient entered care with [facility] at 6 wks.   EDC 04/26/23 by LMP and congruent w/ early 1st trimester U/S.   Anatomy scan:  complete w/ anterior placenta.   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 01/11/2023   Pyelectasis of fetus on prenatal ultrasound 12/21/2022   Short interval between pregnancies affecting pregnancy, antepartum 12/21/2022   Carrier of spinal muscular atrophy 12/21/2022   Obesity affecting pregnancy 11/19/2022    Prenatal Labs: ABO, Rh: --/--/O POS (03/21 1408) Antibody: NEG (03/21 1408) Rubella: Immune (08/21 0000)  RPR: Nonreactive (08/21 0000)  HBsAg: Negative (08/21 0000)  HIV: Non-reactive (08/21 0000)  GTT: normal 1 hr GBS:   neg GC/CHL: neg/neg Genetics: low-risk Vaccines: Tdap: received  Influenza: declined   OB History  Gravida Para Term Preterm AB Living  3 2 2  0 0 2  SAB IAB Ectopic Multiple Live Births  0 0 0 0 2    #  Outcome Date GA Lbr Len/2nd Weight Sex Type Anes PTL Lv  3 Current           2 Term 11/27/21 [redacted]w[redacted]d 30:15 / 00:15 3280 g F Vag-Spont EPI  LIV  1 Term 05/15/17 [redacted]w[redacted]d 02:49 / 00:27 4110 g F Vag-Spont EPI  LIV    Medical / Surgical History: Past medical history:  Past Medical History:  Diagnosis Date   Abnormal Pap smear of cervix    Chlamydia    Dysmenorrhea    Trichomonas infection    UTI (urinary tract infection)     Past surgical history:  Past Surgical History:  Procedure Laterality Date   FOOT SURGERY Right    Family History:  Family History  Problem Relation Age of Onset   Fibroids Mother    Diabetes Cousin    Asthma Neg Hx    Cancer Neg Hx    Heart disease Neg Hx    Hypertension Neg Hx     Social History:  reports that she has never smoked. She has never used smokeless tobacco. She reports that she does not currently use alcohol. She reports that she does not use drugs.  Allergies: Patient has no known allergies.   Current Medications at time of admission:  Prior to Admission medications   Medication Sig Start Date End Date Taking? Authorizing Provider  aspirin EC 81 MG tablet Take 81 mg by mouth daily. Swallow whole.    [provider]  magnesium oxide (MAG-OX) 400 (240 Mg) MG tablet Take 400 mg by mouth daily.    [provider]  Prenatal  Vit-Fe Fumarate-FA (PRENATAL MULTIVITAMIN) TABS tablet Take 1 tablet by mouth daily at 12 noon.    [provider]  norethindrone (MICRONOR,CAMILA,ERRIN) 0.35 MG tablet Take 1 tablet (0.35 mg total) by mouth daily. 06/24/17 09/28/21  Reva Bores, MD    Review of Systems: Constitutional: Negative   HENT: Negative   Eyes: Negative   Respiratory: Negative   Cardiovascular: Negative   Gastrointestinal: Negative  Genitourinary: neg for bloody show, neg for LOF   Musculoskeletal: Negative   Skin: Negative   Neurological: Negative   Endo/Heme/Allergies: Negative   Psychiatric/Behavioral: Negative     Physical Exam: VS: Blood pressure (!) 115/55, pulse (!) 110, last menstrual period 07/20/2022, SpO2 99%, currently breastfeeding. AAO x3, no signs of distress Cardiovascular: RRR Respiratory: Unlabored GU/GI: Abdomen gravid, non-tender, non-distended, active FM, vertex, Extremities: 1+ non-pitting edema, negative for pain, tenderness, and cords  Cervical exam:Dilation: Closed Effacement (%): Thick Station:   Exam by:: Lizbeth Bark, CNM FHR: baseline rate 145 / variability moderate / accelerations present / absent decelerations TOCO: none   Prenatal Transfer Tool  Maternal Diabetes: No Genetic Screening: Abnormal:  Results: Other:SMA carrier Maternal Ultrasounds/Referrals: Other:Intralobular Bronchopulmonary Sequestration w/ a normal fetal echo, and a marginal cord insertion Fetal Ultrasounds or other Referrals:  Fetal echo, Referred to Materal Fetal Medicine  Maternal Substance Abuse:  No Significant Maternal Medications:  None Significant Maternal Lab Results: Group B Strep negative Number of Prenatal Visits:greater than 3 verified prenatal visits Maternal Vaccinations:TDap Other Comments:  None    Assessment: 28 y.o. G3P2002 [redacted]w[redacted]d IOL for BPP 4/8 with hx of fetal Intralobular Bronchopulmonary Sequestration     -normal fetal echo Marginal cord insertion FHR category 1 GBS neg Pain management plan: per pt request   Plan:  Admit to L&D Routine admission orders Epidural PRN Cytotec for ripening  Dr Sallye Ober notified of admission and plan of care  Roma Schanz DNP, CNM 04/05/2023 6:35 PM

## 2023-04-05 NOTE — Telephone Encounter (Signed)
 Preadmission screen

## 2023-04-05 NOTE — MAU Provider Note (Signed)
 MAU Provider Note  Chief Complaint: No chief complaint on file.  SUBJECTIVE HPI: Heather Durham is a 28 y.o. G3P2002 at [redacted]w[redacted]d by LMP who presents to maternity admissions reporting from MFM for 4/8 BPP today, with request for 4-6 hour monitoring then repeat BPP. Per Dr. Judeth Cornfield if NST nonreactive or BPP <8/8 recommend IOL today; patient agreeable to this plan.   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  Receives [facility] with [facility].   HPI  Past Medical History:  Diagnosis Date   Abnormal Pap smear of cervix    Chlamydia    Dysmenorrhea    Trichomonas infection    UTI (urinary tract infection)    Past Surgical History:  Procedure Laterality Date   FOOT SURGERY Right    Social History   Socioeconomic History   Marital status: Single    Spouse name: Not on file   Number of children: Not on file   Years of education: Not on file   Highest education level: Not on file  Occupational History   Not on file  Tobacco Use   Smoking status: Never   Smokeless tobacco: Never  Vaping Use   Vaping status: Never Used  Substance and Sexual Activity   Alcohol use: Not Currently    Comment: occasional   Drug use: No   Sexual activity: Not Currently    Birth control/protection: None  Other Topics Concern   Not on file  Social History Narrative   Not on file   Social Drivers of Health   Financial Resource Strain: Not on file  Food Insecurity: No Food Insecurity (11/25/2021)   Hunger Vital Sign    Worried About Running Out of Food in the Last Year: Never true    Ran Out of Food in the Last Year: Never true  Transportation Needs: No Transportation Needs (11/25/2021)   PRAPARE - Administrator, Civil Service (Medical): No    Lack of Transportation (Non-Medical): No  Physical Activity: Not on file  Stress: Not on file  Social Connections: Not on file  Intimate Partner Violence: Not At Risk (11/26/2021)   Humiliation, Afraid, Rape,  and Kick questionnaire    Fear of Current or Ex-Partner: No    Emotionally Abused: No    Physically Abused: No    Sexually Abused: No   No current facility-administered medications on file prior to encounter.   Current Outpatient Medications on File Prior to Encounter  Medication Sig Dispense Refill   aspirin EC 81 MG tablet Take 81 mg by mouth daily. Swallow whole.     magnesium oxide (MAG-OX) 400 (240 Mg) MG tablet Take 400 mg by mouth daily.     Prenatal Vit-Fe Fumarate-FA (PRENATAL MULTIVITAMIN) TABS tablet Take 1 tablet by mouth daily at 12 noon.     [DISCONTINUED] norethindrone (MICRONOR,CAMILA,ERRIN) 0.35 MG tablet Take 1 tablet (0.35 mg total) by mouth daily. 3 Package 3   No Known Allergies  ROS:  Pertinent positives/negatives listed above.  I have reviewed patient's Past Medical Hx, Surgical Hx, Family Hx, Social Hx, medications and allergies.   Physical Exam  Patient Vitals for the past 24 hrs:  BP Pulse SpO2  04/05/23 1616 (!) 115/55 (!) 110 --  04/05/23 1601 111/65 (!) 116 --  04/05/23 1546 113/67 (!) 117 --  04/05/23 1536 133/75 (!) 125 --  04/05/23 1416 (!) 147/84 (!) 131 --  04/05/23 1401 (!) 144/84 (!) 121 --  04/05/23 1346 (!) 147/79 (!) 145 --  04/05/23 1331 128/81 (!) 131 --  04/05/23 1320 (!) 147/86 (!) 131 99 %  04/05/23 1303 (!) 144/82 (!) 133 --   Constitutional: Well-developed, well-nourished female in no acute distress  Cardiovascular: normal rate Respiratory: normal effort GI: Abd soft, non-tender MS: Extremities nontender, no edema, normal ROM Neurologic: Alert and oriented x 4  GU: Neg CVAT.  FHT:  Baseline 135, moderate variability, accelerations present, no decelerations Contractions: irritability  LAB RESULTS Results for orders placed or performed during the hospital encounter of 04/05/23 (from the past 24 hours)  Type and screen [facility]     Status: None   Collection Time: 04/05/23  2:08 PM  Result Value Ref Range    ABO/RH(D) O POS    Antibody Screen NEG    Sample Expiration      04/08/2023,2359 Performed at [facility], [address], [place], [place] [address]   Comprehensive metabolic panel     Status: Abnormal   Collection Time: 04/05/23  2:12 PM  Result Value Ref Range   Sodium 139 135 - 145 mmol/L   Potassium 4.0 3.5 - 5.1 mmol/L   Chloride 106 98 - 111 mmol/L   CO2 21 (L) 22 - 32 mmol/L   Glucose, Bld 125 (H) 70 - 99 mg/dL   BUN 5 (L) 6 - 20 mg/dL   Creatinine, Ser 2.95 0.44 - 1.00 mg/dL   Calcium 9.5 8.9 - 28.4 mg/dL   Total Protein 6.9 6.5 - 8.1 g/dL   Albumin 2.5 (L) 3.5 - 5.0 g/dL   AST 15 15 - 41 U/L   ALT 13 0 - 44 U/L   Alkaline Phosphatase 115 38 - 126 U/L   Total Bilirubin 0.3 0.0 - 1.2 mg/dL   GFR, Estimated >13 >24 mL/min   Anion gap 12 5 - 15  CBC     Status: Abnormal   Collection Time: 04/05/23  2:12 PM  Result Value Ref Range   WBC 11.6 (H) 4.0 - 10.5 K/uL   RBC 3.98 3.87 - 5.11 MIL/uL   Hemoglobin 10.8 (L) 12.0 - 15.0 g/dL   HCT 40.1 (L) 02.7 - 25.3 %   MCV 87.4 80.0 - 100.0 fL   MCH 27.1 26.0 - 34.0 pg   MCHC 31.0 30.0 - 36.0 g/dL   RDW 66.4 (H) 40.3 - 47.4 %   Platelets 275 150 - 400 K/uL   nRBC 0.0 0.0 - 0.2 %  Protein / creatinine ratio, urine     Status: None   Collection Time: 04/05/23  2:36 PM  Result Value Ref Range   Creatinine, Urine 196 mg/dL   Total Protein, Urine 15 mg/dL   Protein Creatinine Ratio 0.08 0.00 - 0.15 mg/mg[Cre]    --/--/O POS (03/21 1408)  IMAGING [place] MFM FETAL BPP WO NON STRESS Result Date: 04/05/2023 ----------------------------------------------------------------------  OBSTETRICS REPORT                       (Signed Final 04/05/2023 12:38 pm) ---------------------------------------------------------------------- Patient Info  ID #:       [id number]                          D.O.B.:  01-Apr-1995 (27 yrs)(F)  Name:       Heather Durham                Visit Date: 04/05/2023 09:39 am  ---------------------------------------------------------------------- Performed By  Attending:  Noralee Space MD        Ref. Address:     [address]                                                             [address]                                                             [place]                                                             [address]  Performed By:     Isac Sarna        [facility]  Referred By:      [facility] ---------------------------------------------------------------------- Orders  #  Description                           Code        Ordered By  1  [place] MFM FETAL BPP WO NON               44034.74    RAVI [facility]     STRESS ----------------------------------------------------------------------  #  Order #                     Accession #                Episode #  1  [id number]                   [id number]  [id number] ---------------------------------------------------------------------- Indications  Short interval between pregnancies, 3rd        O09.893  trimester  Large for gestational age fetus affecting      O36.60X0  management of mother  Abnormal fetal ultrasound                      O28.9  (Bronchopulmonary Sequestration)  Obesity complicating pregnancy, third          O99.213  trimester (BMI 45)  Genetic carrier (Increased carrier risk for    Z14.8  SMA)  Low Risk NIPS, 1 hr GTT wnl  Pyelectasis of fetus on prenatal ultrasound    O28.3  (resolved)  [redacted] weeks gestation of pregnancy                Z3A.37 ---------------------------------------------------------------------- Fetal Evaluation  Num Of Fetuses:         1  Fetal Heart Rate(bpm):  140  Cardiac Activity:       Observed  Presentation:            Cephalic  Placenta:               Anterior  P. Cord Insertion:      Previously seen  Amniotic Fluid  AFI FV:      Within normal limits  AFI Sum(cm)     %Tile       Largest Pocket(cm)  7.92            8           2.27  RUQ(cm)       RLQ(cm)       LUQ(cm)        LLQ(cm)  1.87          2.03          1.75           2.27 ---------------------------------------------------------------------- Biophysical Evaluation  Amniotic F.V:   Pocket => 2 cm             F. Tone:        Not Observed  F. Movement:    Not Observed               Score:          4/8  F. Breathing:   Observed ---------------------------------------------------------------------- OB History  Blood Type:   O+  Gravidity:    3         Term:   2  Living:       2 ---------------------------------------------------------------------- Gestational Age  LMP:           37w 0d        Date:  07/20/22                   EDD:   04/26/23  Best:          37w 0d     Det. By:  LMP  (07/20/22)          EDD:   04/26/23 ---------------------------------------------------------------------- Anatomy  Ventricles:            Appears normal         Kidneys:                Appear normal  Diaphragm:             Appears normal         Bladder:  Appears normal  Stomach:               Appears normal, left                         sided ---------------------------------------------------------------------- Impression  I had the pleasure of seeing Ms. Tatym Schermer [facility] at [facility]. She is G3 P2002 at 65-  weeks' gestation and is here for antenatal testing.  Fetal bronchopulmonary sequestration was diagnosed at  anatomy scan.  She does not have gestational diabetes.  Pressure today at  our office is 133/88 mmHg.  Ultrasound  Amniotic fluid is normal good fetal activity seen.  Cephalic  presentation.  Fetal movements and tone did not meet the  criteria of BPP.  No evidence of lung lesion or fetal hydrops  on today's ultrasound.  BPP 4/8.  I  counseled patient on the significance of poor biophysical  profile that BPP of 4/8 is associated with increased risk of  stillbirth within a week (91/1,000).  I discussed the options:  -Evaluation at the MAU for prolonged monitoring and repeat  BPP in 4 to 6 hours.  If BPP is 10/10 (and not less), patient  can be discharged home and delivered at [redacted] weeks  gestation.  Follow-up NST or BPP at your office can be  performed next week.  -Alternatively, she can be delivered now.  Patient will discuss with her provider. ---------------------------------------------------------------------- Recommendations  -If the patient is undelivered and discharged after a BPP of  10/10, she should have NST or BPP at your office next week. ----------------------------------------------------------------------                 Noralee Space, MD Electronically Signed Final Report   04/05/2023 12:38 pm ----------------------------------------------------------------------   [place] MFM FETAL BPP WO NON STRESS Result Date: 03/22/2023 ----------------------------------------------------------------------  OBSTETRICS REPORT                       (Signed Final 03/22/2023 04:10 pm) ---------------------------------------------------------------------- Patient Info  ID #:       [id number]                          D.O.B.:  [birth year] (27 yrs)(F)  Name:       Heather Durham                Visit Date: 03/22/2023 02:43 pm ---------------------------------------------------------------------- Performed By  Attending:        Noralee Space MD        Ref. Address:     [address]                                                             [address]                                                             [place]  [address]  Performed By:     Dennis Bast RDMS      Location[facility]  Referred By:      [facility] ---------------------------------------------------------------------- Orders  #  Description                           Code        Ordered By  1  [place] MFM FETAL BPP WO NON               76819.01    RAVI SHANKAR     STRESS  2  [place] MFM OB FOLLOW UP                   76816.01    RAVI [facility] ----------------------------------------------------------------------  #  Order #                     Accession #                Episode #  1  [id number]                   [id number]                 [id number]  2  [id number]                   [id number]                 [id number] ---------------------------------------------------------------------- Indications  Short interval between pregnancies, 3rd        O09.893  trimester  Large for gestational age fetus affecting      O36.60X0  management of mother  Abnormal fetal ultrasound                      O28.9  (Bronchopulmonary Sequestration)  Obesity complicating pregnancy, third          O99.213  trimester (BMI 45)  Genetic carrier (Increased carrier risk for    Z14.8  SMA)  Low Risk NIPS  Pyelectasis of fetus on prenatal ultrasound    O28.3  (resolved)  [redacted] weeks gestation of pregnancy  Z3A.35 ---------------------------------------------------------------------- Vital Signs  BP:          115/73 ---------------------------------------------------------------------- Fetal Evaluation  Num Of Fetuses:         1  Fetal Heart Rate(bpm):  140  Cardiac Activity:       Observed  Presentation:           Cephalic  Placenta:               Anterior  P. Cord Insertion:      Previously seen  Amniotic Fluid  AFI FV:      Within normal limits  AFI Sum(cm)     %Tile       Largest Pocket(cm)  13.39           46          5.38  RUQ(cm)       RLQ(cm)       LUQ(cm)        LLQ(cm)  5.38          3.67          1.24            3.1 ---------------------------------------------------------------------- Biometry  BPD:      90.1  mm     G. Age:  36w 3d         88  %    CI:        72.94   %    70 - 86                                                          FL/HC:      21.2   %    20.1 - 22.3  HC:      335.4  mm     G. Age:  38w 3d         91  %    HC/AC:      1.01        0.93 - 1.11  AC:      332.1  mm     G. Age:  37w 1d         96  %    FL/BPD:     78.9   %    71 - 87  FL:       71.1  mm     G. Age:  36w 3d         79  %    FL/AC:      21.4   %    20 - 24  Est. FW:    3094  gm    6 lb 13 oz      93  % ---------------------------------------------------------------------- OB History  Blood Type:   O+  Gravidity:    3         Term:   2  Living:       2 ---------------------------------------------------------------------- Gestational Age  LMP:           35w 0d        Date:  07/20/22                   EDD:   04/26/23  U/S Today:     37w 1d  EDD:   04/11/23  Best:          Consuello Closs 0d     Det. By:  LMP  (07/20/22)          EDD:   04/26/23 ---------------------------------------------------------------------- Anatomy  Cranium:               Previously seen        Aortic Arch:            Previously seen  Cavum:                 Previously seen        Ductal Arch:            Previously seen  Ventricles:            Appears normal         Diaphragm:              Appears normal  Choroid Plexus:        Previously seen        Stomach:                Appears normal, left                                                                        sided  Cerebellum:            Previously seen        Abdomen:                Previously seen  Posterior Fossa:       Previously seen        Abdominal Wall:         Previously seen  Face:                  Orbits and profile     Cord Vessels:           Previously seen                         previously seen  Lips:                  Previously seen        Kidneys:                Appear normal   Thoracic:              Previously seen        Bladder:                Appears normal  Heart:                 Appears normal         Spine:                  Previously seen                         (4CH, axis, and                         situs)  RVOT:  Previously seen        Upper Extremities:      Previously seen  LVOT:                  Previously seen        Lower Extremities:      Previously seen ---------------------------------------------------------------------- Cervix Uterus Adnexa  Cervix  Not visualized (advanced GA >24wks)  Uterus  No abnormality visualized.  Right Ovary  Not visualized.  Left Ovary  Within normal limits.  Cul De Sac  No free fluid seen.  Adnexa  No abnormality visualized ---------------------------------------------------------------------- Impression  Bronchopulmonary sequestration.  Patient returned for antenatal testing.  Amniotic fluid is normal good fetal activity seen.  Antenatal  testing is reassuring.  BPP 8/8.  We could not identify the right lung lesion on today's  ultrasound.  No evidence of fetal hydrops.  I reassured the patient of the findings. ---------------------------------------------------------------------- Recommendations  -Continue weekly BPP till delivery. ----------------------------------------------------------------------                 Noralee Space, MD Electronically Signed Final Report   03/22/2023 04:10 pm ----------------------------------------------------------------------   [place] MFM OB FOLLOW UP Result Date: 03/22/2023 ----------------------------------------------------------------------  OBSTETRICS REPORT                       (Signed Final 03/22/2023 04:10 pm) ---------------------------------------------------------------------- Patient Info  ID #:       [id number]                          D.O.B.:  May 09, 1995 (27 yrs)(F)  Name:       Heather Durham                Visit Date: 03/22/2023 02:43 pm  ---------------------------------------------------------------------- Performed By  Attending:        Noralee Space MD        Ref. Address:     [address]                                                             [address]                                                             [place]                                                             [address]  Performed By:     Dennis Bast RDMS      [facility]  Referred By:      [facility] ---------------------------------------------------------------------- Orders  #  Description                           Code        Ordered By  1  [place] MFM FETAL BPP WO NON               76819.01    RAVI SHANKAR     STRESS  2  [place] MFM OB FOLLOW UP                   76816.01    RAVI [facility] ----------------------------------------------------------------------  #  Order #                     Accession #                Episode #  1  [id number]                   [id number]                 [id number]  2  [id number]                   [id number]                 [id number] ---------------------------------------------------------------------- Indications  Short interval between pregnancies, 3rd        O09.893  trimester  Large for gestational age fetus affecting      O36.60X0  management of mother  Abnormal fetal ultrasound                      O28.9  (Bronchopulmonary Sequestration)  Obesity complicating pregnancy, third          O99.213  trimester (BMI 45)  Genetic carrier (Increased carrier risk for    Z14.8  SMA)  Low Risk NIPS  Pyelectasis of fetus on prenatal ultrasound    O28.3  (resolved)  [redacted] weeks gestation of pregnancy                Z3A.35 ----------------------------------------------------------------------  Vital Signs  BP:          115/73 ---------------------------------------------------------------------- Fetal Evaluation  Num Of Fetuses:         1  Fetal Heart Rate(bpm):  140  Cardiac Activity:       Observed  Presentation:           Cephalic  Placenta:  Anterior  P. Cord Insertion:      Previously seen  Amniotic Fluid  AFI FV:      Within normal limits  AFI Sum(cm)     %Tile       Largest Pocket(cm)  13.39           46          5.38  RUQ(cm)       RLQ(cm)       LUQ(cm)        LLQ(cm)  5.38          3.67          1.24           3.1 ---------------------------------------------------------------------- Biometry  BPD:      90.1  mm     G. Age:  36w 3d         88  %    CI:        72.94   %    70 - 86                                                          FL/HC:      21.2   %    20.1 - 22.3  HC:      335.4  mm     G. Age:  38w 3d         91  %    HC/AC:      1.01        0.93 - 1.11  AC:      332.1  mm     G. Age:  37w 1d         96  %    FL/BPD:     78.9   %    71 - 87  FL:       71.1  mm     G. Age:  36w 3d         79  %    FL/AC:      21.4   %    20 - 24  Est. FW:    3094  gm    6 lb 13 oz      93  % ---------------------------------------------------------------------- OB History  Blood Type:   O+  Gravidity:    3         Term:   2  Living:       2 ---------------------------------------------------------------------- Gestational Age  LMP:           35w 0d        Date:  07/20/22                   EDD:   04/26/23  U/S Today:     37w 1d                                        EDD:   04/11/23  Best:          35w 0d     Det. By:  LMP  (07/20/22)          EDD:   04/26/23 ---------------------------------------------------------------------- Anatomy  Cranium:  Previously seen        Aortic Arch:            Previously seen  Cavum:                 Previously seen        Ductal Arch:            Previously seen  Ventricles:            Appears normal         Diaphragm:              Appears normal   Choroid Plexus:        Previously seen        Stomach:                Appears normal, left                                                                        sided  Cerebellum:            Previously seen        Abdomen:                Previously seen  Posterior Fossa:       Previously seen        Abdominal Wall:         Previously seen  Face:                  Orbits and profile     Cord Vessels:           Previously seen                         previously seen  Lips:                  Previously seen        Kidneys:                Appear normal  Thoracic:              Previously seen        Bladder:                Appears normal  Heart:                 Appears normal         Spine:                  Previously seen                         (4CH, axis, and                         situs)  RVOT:                  Previously seen        Upper Extremities:      Previously seen  LVOT:                  Previously seen  Lower Extremities:      Previously seen ---------------------------------------------------------------------- Cervix Uterus Adnexa  Cervix  Not visualized (advanced GA >24wks)  Uterus  No abnormality visualized.  Right Ovary  Not visualized.  Left Ovary  Within normal limits.  Cul De Sac  No free fluid seen.  Adnexa  No abnormality visualized ---------------------------------------------------------------------- Impression  Bronchopulmonary sequestration.  Patient returned for antenatal testing.  Amniotic fluid is normal good fetal activity seen.  Antenatal  testing is reassuring.  BPP 8/8.  We could not identify the right lung lesion on today's  ultrasound.  No evidence of fetal hydrops.  I reassured the patient of the findings. ---------------------------------------------------------------------- Recommendations  -Continue weekly BPP till delivery. ----------------------------------------------------------------------                 Noralee Space, MD Electronically Signed Final Report   03/22/2023  04:10 pm ----------------------------------------------------------------------   [place] MFM FETAL BPP WO NON STRESS Result Date: 03/15/2023 ----------------------------------------------------------------------  OBSTETRICS REPORT                       (Signed Final 03/15/2023 11:38 am) ---------------------------------------------------------------------- Patient Info  ID #:       [id number]                          D.O.B.:  August 17, 1995 (27 yrs)(F)  Name:       Heather Durham                Visit Date: 03/15/2023 08:08 am ---------------------------------------------------------------------- Performed By  Attending:        Noralee Space MD        Ref. Address:     [address]                                                             [address]                                                             [place] [place]                                                             [address]  Performed By:     Isac Sarna        [facility]                    BS [facility]                                  [facility]  Referred By:      Butler [place]                    OB GYN ---------------------------------------------------------------------- Orders  #  Description                           Code        Ordered By  1  [place] MFM FETAL BPP WO NON               76819.01    RAVI [facility]     STRESS ----------------------------------------------------------------------  #  Order #                     Accession #                Episode #  1  [id number]                   [id number]                 [id number] ---------------------------------------------------------------------- Indications  Pyelectasis of fetus on prenatal ultrasound    O28.3  (resolved)  Large for gestational age fetus affecting      O36.60X0  management of mother  Abnormal fetal ultrasound                       O28.9  (Bronchopulmonary Sequestration)  Obesity complicating pregnancy, third          O99.213  trimester (BMI 45)  Short interval between pregnancies, 3rd        O09.893  trimester  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 Cord Insertion:      Previously seen  AFI Sum(cm)     %Tile       Largest Pocket(cm)  15.47           55          5.04  RUQ(cm)       RLQ(cm)       LUQ(cm)        LLQ(cm)  3.55          5.04          3.48           3.4 ---------------------------------------------------------------------- Biophysical Evaluation  Amniotic F.V:   Pocket => 2 cm             F. Tone:        Observed  F. Movement:  Observed                   Score:          8/8  F. Breathing:   Observed ---------------------------------------------------------------------- OB History  Blood Type:   O+  Gravidity:    3         Term:   2  Living:       2 ---------------------------------------------------------------------- Gestational Age  LMP:           34w 0d        Date:  07/20/22                   EDD:   04/26/23  Best:          34w 0d     Det. By:  LMP  (07/20/22)          EDD:   04/26/23 ---------------------------------------------------------------------- Anatomy  Thoracic:              Abnormal, see          Kidneys:                Appear normal                         comments  Diaphragm:             Appears normal         Bladder:                Appears normal  Stomach:               Appears normal, left                         sided ---------------------------------------------------------------------- Impression  Bronchopulmonary sequestration.  Patient returned for antenatal testing.   She does not have  gestational diabetes.  Amniotic fluid is normal good fetal activity seen.  Antenatal  testing is reassuring. BPP 8/8.  Right lung lesion measuring 3.2 x 2.7 x 2.9, is seen.  The  CVR is 0.41 (low likelihood for hydrops).  I reassured the patient of the findings. ---------------------------------------------------------------------- Recommendations  -Continue weekly BPP till delivery. ----------------------------------------------------------------------                 Noralee Space, MD Electronically Signed Final Report   03/15/2023 11:38 am ----------------------------------------------------------------------   [place] MFM FETAL BPP WO NON STRESS Result Date: 03/08/2023 ----------------------------------------------------------------------  OBSTETRICS REPORT                       (Signed Final 03/08/2023 01:26 pm) ---------------------------------------------------------------------- Patient Info  ID #:       [id number]                          D.O.B.:  1995/07/12 (27 yrs)(F)  Name:       Heather Durham                Visit Date: 03/08/2023 09:35 am ---------------------------------------------------------------------- Performed By  Attending:        Braxton Feathers DO       Ref. Address:     [address]  [address]                                                             [place]                                                             [address]  Performed By:     Marcellina Millin       Location[facility]         [facility]                    RDMS                                     [facility] at                                                             [facility]  Referred By:      [facility] GYN ---------------------------------------------------------------------- Orders  #  Description                           Code        Ordered By   1  [place] MFM FETAL BPP WO NON               76819.01    RAVI [facility]     STRESS ----------------------------------------------------------------------  #  Order #                     Accession #                Episode #  1  [id number]                   [id number]                 [id number] ---------------------------------------------------------------------- Indications  Pyelectasis of fetus on prenatal ultrasound    O28.3  (resolved)  Large for gestational age fetus affecting      O36.60X0  management of mother  Abnormal fetal ultrasound                      O28.9  (Bronchopulmonary Sequestration)  Obesity complicating pregnancy, third  N56.213  trimester (BMI 45)  Short interval between pregnancies, 3rd        O09.893  trimester  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 Cord Insertion:      Previously seen  Amniotic Fluid  AFI FV:      Within normal limits  AFI Sum(cm)     %Tile       Largest Pocket(cm)  11.7            31          5.52  RUQ(cm)       RLQ(cm)       LUQ(cm)        LLQ(cm)  2.98          1.84          5.52           1.36 ---------------------------------------------------------------------- Biophysical Evaluation  Amniotic F.V:   Pocket => 2 cm             F. Tone:        Observed  F. Movement:    Observed                   Score:          8/8  F. Breathing:   Observed ---------------------------------------------------------------------- Biometry  HC:      319.1  mm     G. Age:  36w 0d         86  % ---------------------------------------------------------------------- OB History  Blood Type:   O+  Gravidity:    3         Term:   2  Living:       2  ---------------------------------------------------------------------- Gestational Age  LMP:           33w 0d        Date:  07/20/22                   EDD:   04/26/23  U/S Today:     36w 0d                                        EDD:   04/05/23  Best:          33w 0d     Det. By:  LMP  (07/20/22)          EDD:   04/26/23 ---------------------------------------------------------------------- Anatomy  Thoracic:              Abnormal, see          Kidneys:                Appear normal                         comments  Diaphragm:  Appears normal         Bladder:                Appears normal  Stomach:               Appears normal, left                         sided ---------------------------------------------------------------------- Comments  The patient is here for a BPP for known BPS and elevated  BMI. She is at 33w 0d. EDD of 04/26/2023 dated by: LMP  (07/20/22). She has no concerns today.  Sonographic findings  Single intrauterine pregnancy.  Fetal cardiac activity: Observed.  Presentation: Cephalic.  Interval fetal anatomy appears normal exept for a known BPS  (poorly visualized today).  Amniotic fluid: Within normal limits.  MVP: 5.52 cm.  Placenta: Anterior.  BPP: 8/8.  Recommendations  - Continue weekly antenatal testing until delivery  - Delivery around 39 weeks ----------------------------------------------------------------------                  Braxton Feathers, DO Electronically Signed Final Report   03/08/2023 01:26 pm ----------------------------------------------------------------------     MAU Management/MDM: Orders Placed This Encounter  Procedures   Group B strep by PCR   [place] MFM FETAL BPP WO NON STRESS   Comprehensive metabolic panel   CBC   Protein / creatinine ratio, urine   RPR   Notify physician (specify) Confirmatory reading of BP> 160/110 15 minutes later   Apply Hypertensive Disorders of Pregnancy Care Plan   Measure blood pressure   Type and screen [facility]    Meds ordered this encounter  Medications   AND Linked Order Group    labetalol (NORMODYNE) injection 20 mg    labetalol (NORMODYNE) injection 40 mg    labetalol (NORMODYNE) injection 80 mg    hydrALAZINE (APRESOLINE) injection 10 mg     Available prenatal records reviewed.  ASSESSMENT 1. [redacted] weeks gestation of pregnancy   2. Gestational hypertension, third trimester   3. History of fetal biophysical profile with non-stress test   Reassuring prolonged fetal monitoring but 6/8 repeat BPP   Meeting criteria for gestational hypertension  - preeclampsia labs negative  PLAN Admit for IOL d/t non-reassuring fetal status and new gHTN  Care relinquished to CNM Lowella Curb, MD FMOB Fellow, Faculty practice [facility]  04/05/2023  6:35 PM

## 2023-04-05 NOTE — H&P (Incomplete)
 OB ADMISSION/ HISTORY & PHYSICAL:  Admission Date: 04/05/2023 12:53 PM  Admit Diagnosis: Gestational hypertension  Heather Durham is a 28 y.o. female G3P2002 [redacted]w[redacted]d presenting to MAU from MFM for a 4/8 BPP. Dr. Judeth Cornfield recommended extended monitoring, repeat BPP, and IOL for BPP less than 10/10. Repeat BPP 8/10 today. Pt met criteria for gestational hypertension while in MAU. Protein creatinine ratio of 0.08. Endorses active FM, denies LOF and vaginal bleeding.   Pregnancy risks: Intralobular Bronchopulmonary Sequestration     -normal fetal echo Marginal cord insertion LGA 98% (hx of 9# baby) SMA carrier  History of current pregnancy: [id number]   Patient entered care with [facility] at 6 wks.   EDC 04/26/23 by LMP and congruent w/ early 1st trimester U/S.   Anatomy scan:  complete w/ anterior placenta.   Antenatal testing: for Intralobular Bronchopulmonary Sequestration   Last evaluation: 37+0 wks cephalic/ Anterior placenta/ Marginal cord insertion/ AFI 11/ BPP 4/8, then 8/10  Significant prenatal events:  Patient Active Problem List   Diagnosis Date Noted  . Gestational hypertension w/o significant proteinuria in 3rd trimester 04/05/2023  . 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 Pyelectasis of fetus on prenatal ultrasound 12/21/2022  . Short interval between pregnancies affecting pregnancy, antepartum 12/21/2022  . Carrier of spinal muscular atrophy 12/21/2022  . Obesity affecting pregnancy 11/19/2022    Prenatal Labs: ABO, Rh: --/--/O POS (03/21 1408) Antibody: NEG (03/21 1408) Rubella: Immune (08/21 0000)  RPR: Nonreactive (08/21 0000)  HBsAg: Negative (08/21 0000)  HIV: Non-reactive (08/21 0000)  GTT: normal 1 hr GBS:   *** GC/CHL: neg/neg Genetics: low-risk Vaccines: Tdap: received Influenza: declined   OB History  Gravida Para Term Preterm AB Living  3 2 2  0 0 2  SAB IAB Ectopic Multiple Live Births  0 0 0 0 2     # Outcome Date GA Lbr Len/2nd Weight Sex Type Anes PTL Lv  3 Current           2 Term 11/27/21 [redacted]w[redacted]d 30:15 / 00:15 3280 g F Vag-Spont EPI  LIV  1 Term 05/15/17 [redacted]w[redacted]d 02:49 / 00:27 4110 g F Vag-Spont EPI  LIV    Medical / Surgical History: Past medical history:  Past Medical History:  Diagnosis Date  . Abnormal Pap smear of cervix   . Chlamydia   . Dysmenorrhea   . Trichomonas infection   . UTI (urinary tract infection)     Past surgical history:  Past Surgical History:  Procedure Laterality Date  . FOOT SURGERY Right    Family History:  Family History  Problem Relation Age of Onset  . Fibroids Mother   . Diabetes Cousin   . Asthma Neg Hx   . Cancer Neg Hx   . Heart disease Neg Hx   . Hypertension Neg Hx     Social History:  reports that she has never smoked. She has never used smokeless tobacco. She reports that she does not currently use alcohol. She reports that she does not use drugs.  Allergies: Patient has no known allergies.   Current Medications at time of admission:  Prior to Admission medications   Medication Sig Start Date End Date Taking? Authorizing Provider  aspirin EC 81 MG tablet Take 81 mg by mouth daily. Swallow whole.    [provider]  magnesium oxide (MAG-OX) 400 (240 Mg) MG tablet Take 400 mg by mouth daily.    [provider]  Prenatal Vit-Fe  Fumarate-FA (PRENATAL MULTIVITAMIN) TABS tablet Take 1 tablet by mouth daily at 12 noon.    [provider]  norethindrone (MICRONOR,CAMILA,ERRIN) 0.35 MG tablet Take 1 tablet (0.35 mg total) by mouth daily. 06/24/17 09/28/21  Reva Bores, MD    Review of Systems: Constitutional: Negative   HENT: Negative   Eyes: Negative   Respiratory: Negative   Cardiovascular: Negative   Gastrointestinal: Negative  Genitourinary: *** for bloody show, *** for LOF   Musculoskeletal: Negative   Skin: Negative   Neurological: Negative   Endo/Heme/Allergies: Negative    Psychiatric/Behavioral: Negative    Physical Exam: VS: Blood pressure (!) 115/55, pulse (!) 110, last menstrual period 07/20/2022, SpO2 99%, currently breastfeeding. AAO x3, no signs of distress Cardiovascular: RRR Respiratory: Unlabored GU/GI: Abdomen gravid, non-tender, non-distended, active FM, vertex, EFW *** per Leopold's Extremities: *** edema, negative for pain, tenderness, and cords  Cervical exam:  FHR: baseline rate *** / variability *** / accelerations *** / *** decelerations TOCO: ***   Prenatal Transfer Tool  Maternal Diabetes: No Genetic Screening: Abnormal:  Results: Other:SMA carrier Maternal Ultrasounds/Referrals: Other:Intralobular Bronchopulmonary Sequestration w/ a normal fetal echo, and a marginal cord insertion Fetal Ultrasounds or other Referrals:  Fetal echo, Referred to Materal Fetal Medicine  Maternal Substance Abuse:  No Significant Maternal Medications:  None Significant Maternal Lab Results: Group B Strep negative Number of Prenatal Visits:greater than 3 verified prenatal visits Maternal Vaccinations:{Maternal Immunizations:31012} Other Comments:  None    Assessment: 28 y.o. G3P2002 [redacted]w[redacted]d IOL for BPP 4/8 with hx of fetal Intralobular Bronchopulmonary Sequestration     -normal fetal echo Marginal cord insertion FHR category 1 GBS *** Pain management plan: ***   Plan:  Admit to L&D Routine admission orders Epidural PRN *** Dr Sallye Ober notified of admission and plan of care  Roma Schanz DNP, CNM 04/05/2023 6:35 PM

## 2023-04-05 NOTE — Progress Notes (Signed)
 Maternal-Fetal Medicine Consultation I had the pleasure of seeing [name] [facility] at [facility]. She is G3 P2002 at 37-weeks' gestation and is here for antenatal testing. Fetal bronchopulmonary sequestration was diagnosed at anatomy scan.   She does not have gestational diabetes.  Pressure today at our office is 133/88 mmHg.  Ultrasound Amniotic fluid is normal good fetal activity seen.  Cephalic presentation.  Fetal movements and tone did not meet the criteria of BPP.  No evidence of lung lesion or fetal hydrops on today's ultrasound.  BPP 4/8.  I counseled patient on the significance of poor biophysical profile that BPP of 4/8 is associated with increased risk of stillbirth within a week (91/1,000). I discussed the options: -Evaluation at the MAU for prolonged monitoring and repeat BPP in 4 to 6 hours.  If BPP is 10/10 (and not less), patient can be discharged home and delivered at [redacted] weeks gestation.  Follow-up NST or BPP at your office can be performed next week. -Alternatively, she can be delivered now.  Patient will discuss with her provider.  Recommendations -If the patient is undelivered and discharged after a BPP of 10/10, she should have NST or BPP at your office next week.  Consultation including face-to-face (more than 50%) counseling 20 minutes.

## 2023-04-06 ENCOUNTER — Encounter (HOSPITAL_COMMUNITY): Payer: Self-pay | Admitting: Obstetrics & Gynecology

## 2023-04-06 ENCOUNTER — Inpatient Hospital Stay (HOSPITAL_COMMUNITY): Admitting: Anesthesiology

## 2023-04-06 LAB — CBC
HCT: 33.1 % — ABNORMAL LOW (ref 36.0–46.0)
HCT: 32.1 % — ABNORMAL LOW (ref 36.0–46.0)
Hemoglobin: 10.5 g/dL — ABNORMAL LOW (ref 12.0–15.0)
Hemoglobin: 10.2 g/dL — ABNORMAL LOW (ref 12.0–15.0)
MCH: 27.5 pg (ref 26.0–34.0)
MCH: 27.6 pg (ref 26.0–34.0)
MCHC: 31.7 g/dL (ref 30.0–36.0)
MCHC: 31.8 g/dL (ref 30.0–36.0)
MCV: 86.6 fL (ref 80.0–100.0)
MCV: 87 fL (ref 80.0–100.0)
Platelets: 256 10*3/uL (ref 150–400)
Platelets: 260 10*3/uL (ref 150–400)
RBC: 3.82 MIL/uL — ABNORMAL LOW (ref 3.87–5.11)
RBC: 3.69 MIL/uL — ABNORMAL LOW (ref 3.87–5.11)
RDW: 16.1 % — ABNORMAL HIGH (ref 11.5–15.5)
RDW: 15.9 % — ABNORMAL HIGH (ref 11.5–15.5)
WBC: 10.9 10*3/uL — ABNORMAL HIGH (ref 4.0–10.5)
WBC: 12.6 10*3/uL — ABNORMAL HIGH (ref 4.0–10.5)
nRBC: 0 % (ref 0.0–0.2)
nRBC: 0 % (ref 0.0–0.2)

## 2023-04-06 LAB — RPR: RPR Ser Ql: NONREACTIVE

## 2023-04-06 MED ORDER — ACETAMINOPHEN 325 MG PO TABS: 650.0000 mg | ORAL_TABLET | ORAL | Status: DC | PRN

## 2023-04-06 MED ORDER — TERBUTALINE SULFATE 1 MG/ML IJ SOLN: 0.2500 mg | Freq: Once | INTRAMUSCULAR | Status: DC | PRN

## 2023-04-06 MED ORDER — PRENATAL MULTIVITAMIN CH
1.0000 | ORAL_TABLET | Freq: Every day | ORAL | Status: DC
  Administered 2023-04-07 – 2023-04-08 (×2): 1 via ORAL
  Filled 2023-04-06 (×2): qty 1

## 2023-04-06 MED ORDER — ZOLPIDEM TARTRATE 5 MG PO TABS: 5.0000 mg | ORAL_TABLET | Freq: Every evening | ORAL | Status: DC | PRN

## 2023-04-06 MED ORDER — SIMETHICONE 80 MG PO CHEW: 80.0000 mg | CHEWABLE_TABLET | ORAL | Status: DC | PRN

## 2023-04-06 MED ORDER — OXYCODONE HCL 5 MG PO TABS: 5.0000 mg | ORAL_TABLET | ORAL | Status: DC | PRN

## 2023-04-06 MED ORDER — WITCH HAZEL-GLYCERIN EX PADS: 1.0000 | MEDICATED_PAD | CUTANEOUS | Status: DC | PRN

## 2023-04-06 MED ORDER — TERBUTALINE SULFATE 1 MG/ML IJ SOLN
0.2500 mg | Freq: Once | INTRAMUSCULAR | Status: AC | PRN
  Administered 2023-04-06: 0.25 mg via SUBCUTANEOUS

## 2023-04-06 MED ORDER — OXYTOCIN-SODIUM CHLORIDE 30-0.9 UT/500ML-% IV SOLN
1.0000 m[IU]/min | INTRAVENOUS | Status: DC
  Administered 2023-04-06: 6 m[IU]/min via INTRAVENOUS
  Administered 2023-04-06: 2 m[IU]/min via INTRAVENOUS
  Filled 2023-04-06: qty 500

## 2023-04-06 MED ORDER — MAGNESIUM HYDROXIDE 400 MG/5ML PO SUSP: 30.0000 mL | ORAL | Status: DC | PRN

## 2023-04-06 MED ORDER — COCONUT OIL OIL: 1.0000 | TOPICAL_OIL | Status: DC | PRN

## 2023-04-06 MED ORDER — BUPIVACAINE HCL (PF) 0.25 % IJ SOLN
INTRAMUSCULAR | Status: DC | PRN
  Administered 2023-04-06 (×2): 5 mL via EPIDURAL

## 2023-04-06 MED ORDER — DIPHENHYDRAMINE HCL 25 MG PO CAPS: 25.0000 mg | ORAL_CAPSULE | Freq: Four times a day (QID) | ORAL | Status: DC | PRN

## 2023-04-06 MED ORDER — SODIUM CHLORIDE 0.9% FLUSH
3.0000 mL | Freq: Two times a day (BID) | INTRAVENOUS | Status: DC
  Administered 2023-04-07: 3 mL via INTRAVENOUS

## 2023-04-06 MED ORDER — MISOPROSTOL 25 MCG QUARTER TABLET
25.0000 ug | ORAL_TABLET | Freq: Once | ORAL | Status: AC
  Administered 2023-04-06: 25 ug via ORAL
  Filled 2023-04-06: qty 1

## 2023-04-06 MED ORDER — ONDANSETRON HCL 4 MG/2ML IJ SOLN: 4.0000 mg | INTRAMUSCULAR | Status: DC | PRN

## 2023-04-06 MED ORDER — BENZOCAINE-MENTHOL 20-0.5 % EX AERO: 1.0000 | INHALATION_SPRAY | CUTANEOUS | Status: DC | PRN

## 2023-04-06 MED ORDER — TERBUTALINE SULFATE 1 MG/ML IJ SOLN
INTRAMUSCULAR | Status: AC
  Filled 2023-04-06: qty 1

## 2023-04-06 MED ORDER — MISOPROSTOL 25 MCG QUARTER TABLET
25.0000 ug | ORAL_TABLET | Freq: Once | ORAL | Status: AC
  Administered 2023-04-06: 25 ug via VAGINAL
  Filled 2023-04-06: qty 1

## 2023-04-06 MED ORDER — IBUPROFEN 600 MG PO TABS
600.0000 mg | ORAL_TABLET | Freq: Four times a day (QID) | ORAL | Status: DC
  Administered 2023-04-06 – 2023-04-08 (×6): 600 mg via ORAL
  Filled 2023-04-06 (×7): qty 1

## 2023-04-06 MED ORDER — OXYTOCIN-SODIUM CHLORIDE 30-0.9 UT/500ML-% IV SOLN: 2.5000 [IU]/h | INTRAVENOUS | Status: DC | PRN

## 2023-04-06 MED ORDER — DIBUCAINE (PERIANAL) 1 % EX OINT: 1.0000 | TOPICAL_OINTMENT | CUTANEOUS | Status: DC | PRN

## 2023-04-06 MED ORDER — ONDANSETRON HCL 4 MG PO TABS: 4.0000 mg | ORAL_TABLET | ORAL | Status: DC | PRN

## 2023-04-06 MED ORDER — SODIUM CHLORIDE 0.9% FLUSH: 3.0000 mL | INTRAVENOUS | Status: DC | PRN

## 2023-04-06 MED ORDER — SODIUM CHLORIDE 0.9 % IV SOLN: 250.0000 mL | INTRAVENOUS | Status: AC | PRN

## 2023-04-06 MED ORDER — OXYCODONE HCL 5 MG PO TABS: 10.0000 mg | ORAL_TABLET | ORAL | Status: DC | PRN

## 2023-04-06 MED ORDER — MISOPROSTOL 50MCG HALF TABLET
50.0000 ug | ORAL_TABLET | ORAL | Status: DC
Start: 2023-04-06 — End: 2023-04-07
  Filled 2023-04-06 (×4): qty 1

## 2023-04-06 NOTE — Progress Notes (Signed)
IFSE REMOVED WITH TIP INTACT 

## 2023-04-06 NOTE — Progress Notes (Signed)
 Subjective:    Reports feeling cramping. Discussed starting Pitocin and pt agrees.   Objective:    VS: BP 118/61   Pulse (!) 125   Temp 97.8 F (36.6 C) (Oral)   Resp 16   Ht 5\' 8"  (1.727 m)   Wt (!) 147.4 kg   LMP 07/20/2022   SpO2 99%   BMI 49.42 kg/m  FHR : baseline 130 / variability moderate / accelerations present / absent decelerations Toco: contractions irregular Membranes: intact Dilation: 2.5 Effacement (%): 50 Station: -3 Presentation: Vertex Exam by:: Rhea Pink, CNM  Assessment/Plan:   28 y.o. [phone] [redacted]w[redacted]d IOL for: Intralobular Bronchopulmonary Sequestration     -normal fetal echo Marginal cord insertion LGA 98% (hx of 9# baby) Gestational hypertension BPP 4/8  Labor:  S/P dual dose Cytotec (25/25), starting Pitocin Fetal Wellbeing:  Category I Pain Control:   planning an epidural I/D:   GBS neg Anticipated MOD:  NSVD  Roma Schanz DNP, CNM 04/06/2023 4:36 AM

## 2023-04-06 NOTE — Progress Notes (Signed)
 IFSE TIP REMOVED INTACT

## 2023-04-06 NOTE — Anesthesia Procedure Notes (Signed)
 Epidural Patient location during procedure: OB Start time: 04/06/2023 12:38 PM End time: 04/06/2023 12:46 PM  Staffing Anesthesiologist: Mal Amabile, MD Performed: anesthesiologist   Preanesthetic Checklist Completed: patient identified, IV checked, site marked, risks and benefits discussed, surgical consent, monitors and equipment checked, pre-op evaluation and timeout performed  Epidural Patient position: sitting Prep: DuraPrep and site prepped and draped Patient monitoring: continuous pulse ox and blood pressure Approach: midline Location: L3-L4 Injection technique: LOR air  Needle:  Needle type: Tuohy  Needle gauge: 17 G Needle length: 9 cm and 9 Needle insertion depth: 9 cm Catheter type: closed end flexible Catheter size: 19 Gauge Catheter at skin depth: 10 and 16 cm Test dose: negative and Other  Assessment Events: blood not aspirated, no cerebrospinal fluid, injection not painful, no injection resistance, no paresthesia and negative IV test  Additional Notes Patient identified. Risks and benefits discussed including failed block, incomplete  Pain control, post dural puncture headache, nerve damage, paralysis, blood pressure Changes, nausea, vomiting, reactions to medications-both toxic and allergic and post Partum back pain. All questions were answered. Patient expressed understanding and wished to proceed. Sterile technique was used throughout procedure. Epidural site was Dressed with sterile barrier dressing. No paresthesias, signs of intravascular injection Or signs of intrathecal spread were encountered.  Patient was more comfortable after the epidural was dosed. Please see RN's note for documentation of vital signs and FHR which are stable. Reason for block:procedure for pain

## 2023-04-06 NOTE — Progress Notes (Signed)
 Heather Durham is a 28 y.o. G3P2002 at [redacted]w[redacted]d admitted for induction of labor due to fetal BPP of 8/10 (-2 breathing) and gestational hypertension.  Subjective:  Patient is comfortable with epidural.    Objective: BP (!) 113/50   Pulse (!) 140   Temp (!) 97.5 F (36.4 C) (Oral)   Resp 17   Ht 5\' 8"  (1.727 m)   Wt (!) 147.4 kg   LMP 07/20/2022   SpO2 97%   BMI 49.42 kg/m  No intake/output data recorded. No intake/output data recorded.  FHT:  FHR: 140 bpm, variability: moderate,  accelerations:  Present,  decelerations:  Present early .   Earlier on patient had two prolonged decelerations x 2 minutes after epidural placement and drop in her blood pressures.  Patient received phenylephrine, terbutaline, IV fluid bolus and positional changes and tracing resolved to category 1.   UC:   irregular, every 3 to 6 minutes SVE:   Dilation: 4 Effacement (%): 70 Station: -3 Exam by:: Dr Sallye Ober AROM, FSE and IUPC placed in after obtaining verbal consent from patient.   Labs: Lab Results  Component Value Date   WBC 10.9 (H) 04/06/2023   HGB 10.5 (L) 04/06/2023   HCT 33.1 (L) 04/06/2023   MCV 86.6 04/06/2023   PLT 256 04/06/2023    Assessment / Plan: 28 y.o. G3P2002 at [redacted]w[redacted]d admitted for induction of labor due to fetal BPP of 8/10 (-2 breathing) and gestational hypertension,  Labor:  s/p vaginal and oral cytotec then now on pitocin.  Plan to restart pitocin  in half an hour if with normal fetal heart tracing. Preeclampsia:   Gestational hypertension.  Fetal Wellbeing:   Was previously category 2 after obtaining epidural but now is category 1. Maternal Wellbeing: With baseline tachycardia that has been exacerbated with recent terbutaline use, will closely monitor, no associated symptoms.  Pain Control:  Epidural. I/D:   GBS negative. Anticipated MOD:  NSVD.   Prescilla Sours, MD 04/06/2023, 3:02 PM.

## 2023-04-06 NOTE — Progress Notes (Signed)
 NOTE FOR THIS COLUMN UNDER WRONG TIME

## 2023-04-06 NOTE — Anesthesia Preprocedure Evaluation (Signed)
 Anesthesia Evaluation  Patient identified by MRN, date of birth, ID band Patient awake    Reviewed: Allergy & Precautions, Patient's Chart, lab work & pertinent test results  Airway Mallampati: III  TM Distance: >3 FB     Dental  (+) Chipped   Pulmonary neg pulmonary ROS   Pulmonary exam normal breath sounds clear to auscultation       Cardiovascular hypertension, Pt. on medications Normal cardiovascular exam Rhythm:Regular     Neuro/Psych negative neurological ROS  negative psych ROS   GI/Hepatic Neg liver ROS,GERD  Medicated,,  Endo/Other    Class 4 obesity  Renal/GU negative Renal ROS     Musculoskeletal negative musculoskeletal ROS (+)    Abdominal  (+) + obese  Peds  Hematology  (+) Blood dyscrasia, anemia   Anesthesia Other Findings   Reproductive/Obstetrics (+) Pregnancy 37 weeks- induction for HTN                              Anesthesia Physical Anesthesia Plan  ASA: 3  Anesthesia Plan: Epidural   Post-op Pain Management: Minimal or no pain anticipated   Induction:   PONV Risk Score and Plan:   Airway Management Planned: Natural Airway  Additional Equipment: None  Intra-op Plan:   Post-operative Plan:   Informed Consent: I have reviewed the patients History and Physical, chart, labs and discussed the procedure including the risks, benefits and alternatives for the proposed anesthesia with the patient or authorized representative who has indicated his/her understanding and acceptance.       Plan Discussed with: Anesthesiologist  Anesthesia Plan Comments:          Anesthesia Quick Evaluation

## 2023-04-06 NOTE — Progress Notes (Signed)
 Wireless monitors removed wired monitors applied

## 2023-04-06 NOTE — Lactation Note (Signed)
 This note was copied from a baby's chart. Lactation Consultation Note  Patient Name: Heather Durham [id number] Date: 04/06/2023 Age:27 hours Reason for consult: Initial assessment;Early term 37-38.6wks;Breastfeeding assistance  P3- MOB reports that infant initially latched well after birth, but now he will not wake up for his second feeding. LC offered to assist and MOB consented. LC placed infant on the left breast in the cross cradle hold. Infant was too tired to nurse at this time. LC encouraged hand expressing and MOB agreed. LC was able to hand express 4 mL of EBM within 3 minutes from the left breast only. Infant was very slowly spoon fed EBM. Infant tolerated feeding well.   LC reviewed the fiorst 24 hr birthday nap, day 2 cluster feeding, feeding infant on cue 8-12x in 24 hrs, not allowing infant to go over 3 hrs without a feeding, CDC milk storage guidelines and LC services handout. LC encouraged MOB to call for further assistance as needed.   Maternal Data Has patient been taught Hand Expression?: Yes Does the patient have breastfeeding experience prior to this delivery?: Yes How long did the patient breastfeed?: 1.6 years with first child and 11 months with second child  Feeding Mother's Current Feeding Choice: Breast Milk  LATCH Score Latch: Too sleepy or reluctant, no latch achieved, no sucking elicited.  Audible Swallowing: None  Type of Nipple: Everted at rest and after stimulation  Comfort (Breast/Nipple): Soft / non-tender  Hold (Positioning): Full assist, staff holds infant at breast  LATCH Score: 4   Lactation Tools Discussed/Used Pump Education: Milk Storage  Interventions Interventions: Breast feeding basics reviewed;Assisted with latch;Hand express;Breast compression;Adjust position;Support pillows;Position options;Expressed milk;Education;LC Services brochure  Discharge Discharge Education: Engorgement and breast care;Warning signs for feeding  baby Pump: DEBP;Personal  Consult Status Consult Status: Follow-up Date: 04/07/23 Follow-up type: In-patient    Dema Severin BS, IBCLC 04/06/2023, 9:20 PM

## 2023-04-07 LAB — CBC
HCT: 30.1 % — ABNORMAL LOW (ref 36.0–46.0)
Hemoglobin: 9.6 g/dL — ABNORMAL LOW (ref 12.0–15.0)
MCH: 27.7 pg (ref 26.0–34.0)
MCHC: 31.9 g/dL (ref 30.0–36.0)
MCV: 86.7 fL (ref 80.0–100.0)
Platelets: 242 10*3/uL (ref 150–400)
RBC: 3.47 MIL/uL — ABNORMAL LOW (ref 3.87–5.11)
RDW: 16.2 % — ABNORMAL HIGH (ref 11.5–15.5)
WBC: 13.3 10*3/uL — ABNORMAL HIGH (ref 4.0–10.5)
nRBC: 0 % (ref 0.0–0.2)

## 2023-04-07 MED ORDER — SENNOSIDES-DOCUSATE SODIUM 8.6-50 MG PO TABS
2.0000 | ORAL_TABLET | Freq: Every day | ORAL | Status: DC
  Administered 2023-04-08: 2 via ORAL
  Filled 2023-04-07: qty 2

## 2023-04-07 MED ORDER — IBUPROFEN 600 MG PO TABS: 600.0000 mg | ORAL_TABLET | Freq: Four times a day (QID) | ORAL | 0 refills | Status: AC

## 2023-04-07 NOTE — Discharge Instructions (Signed)
  Heather Durham, 1. Do not do any heavy lifting, i.e nothing heavier than 15 lbs for the next 6 weeks.  2.  Do not use tampons or douche or take baths, do not have any sexual intercourse or anything inside the vagina for the next 6 weeks.  3. Take your pain medication as needed for pain, let us know if the pain is not well controlled despite pain medication use.  4. Take your iron rich foods such as spinach, red meats, beans for mild anemia as well as your prenatal vitamin tablets.     5.  If you get a fever while at home, do check your temperature and if it is equal to or greater than 100.4 please call the office.   6. Some vaginal bleeding is expected and normal after your delivery. Please let us know if if it excessive where you saturate 1 pad in less than 2 hours or so.  7. Please let us know if with depression or anxiety symptoms, or symptoms of uncontrolled blood pressure such as headache, vision changes, nausea, vomiting, chest pain, shortness of breath.   [facility] [phone].

## 2023-04-07 NOTE — Lactation Note (Addendum)
 This note was copied from a baby's chart. Lactation Consultation Note  Patient Name: Heather Durham [id number] Date: 04/07/2023 Age:27 hours Reason for consult: Follow-up assessment;Early term 37-38.6wks (Infant weight loss of -2.20%).  Per MOB, infant recently BF for 15 minutes at 2120 pm, infant was laying on MOB chest fully clothed. Per MOB, infant falls asleep after 15 minutes and then wants to eat again. LC explained on 2nd day of life infant may cluster feed, LC suggested to breastfeed infant skin to skin and not fully clothed until milk supply is fully established. LC discussed if latching infant on her 1st breast and if infant is still cuing to offer the 2nd breast during the same feeding. MOB can also hand express afterwards if she chooses, LC reviewed hand expression and MOB easily expressed 8 mls of colostrum that was spoon fed to infant. Per MOB, she was having pain and discomforted when using hand pump earlier today, LC re-fitted MOB with 21 mm breast flange and Per MOB, no pain when trying pumping and MOB easily expressing colostrum in hand pump when demonstrating how to use. MOB is experienced with breastfeeding see maternal data below. Infant had 2 voids and 4 stools today.  Current feeding plan: 1- MOB will continue to Breastfeed infant by cues, on demand, every 2-3 hours,  and BF infant skin to skin not fully clothed. 2- MOB knows if infant is still curing after latching infant on the first breast, she can offer the 2nd breast during the same feeding. 3- MOB can offer EBM if she chooses after the latch by hand expression or using hand pump.  Maternal Data Does the patient have breastfeeding experience prior to this delivery?: Yes How long did the patient breastfeed?: Per MOB, BF 1st child for 18 months, 2nd child who is 14 months for 10 months.  Feeding Mother's Current Feeding Choice: Breast Milk  LATCH Score                   Lactation Tools  Discussed/Used Tools: Pump;Flanges Flange Size: 21 (MOB complained of pain when using hand pump, LC re-fitted MOB with 21 mm breast flange, Per MOB no discomfort was easily expressing colostrum in flanges when demostrating how to use.) Breast pump type: Manual Pump Education: Setup, frequency, and cleaning;Milk Storage Reason for Pumping: MOB was given manual hand pump by RN  Interventions Interventions: Skin to skin;Hand express;Expressed milk;Hand pump;Education;CDC milk storage guidelines;Guidelines for Milk Supply and Pumping Schedule Handout  Discharge    Consult Status Consult Status: Follow-up Date: 04/08/23 Follow-up type: In-patient    Frederico Hamman 04/07/2023, 10:43 PM

## 2023-04-07 NOTE — Anesthesia Postprocedure Evaluation (Signed)
 Anesthesia Post Note  Patient: Heather Durham  Procedure(s) Performed: AN AD HOC LABOR EPIDURAL     Patient location during evaluation: Mother Baby Anesthesia Type: Epidural Level of consciousness: awake and alert Pain management: pain level controlled Vital Signs Assessment: post-procedure vital signs reviewed and stable Respiratory status: spontaneous breathing, nonlabored ventilation and respiratory function stable Cardiovascular status: stable Postop Assessment: no headache, no backache, epidural receding, no apparent nausea or vomiting, patient able to bend at knees, able to ambulate and adequate PO intake Anesthetic complications: no   No notable events documented.  Last Vitals:  Vitals:   04/06/23 2325 04/07/23 0540  BP: 132/77 139/75  Pulse: (!) 109 (!) 111  Resp: 16 16  Temp: 36.8 C 36.7 C  SpO2: 100% 99%    Last Pain:  Vitals:   04/07/23 0842  TempSrc:   PainSc: 0-No pain   Pain Goal:                   [place] [employer]

## 2023-04-07 NOTE — Discharge Summary (Addendum)
 Postpartum Discharge Summary    Patient Name: Heather Durham DOB: April 09, 1995 MRN: [id number]  Date of admission: 04/05/2023 Delivery date:04/06/2023 Delivering provider: Hoover Browns Date of discharge: 04/07/2023  Admitting diagnosis: 1. Abnormal fetal testing with BPP 4/8 (-2 for tone and movement).  2. Gestational hypertension [O13.9] Intrauterine pregnancy: [redacted]w[redacted]d     Secondary diagnosis:  Principal Problem:   Gestational hypertension Active Problems:   Gestational hypertension w/o significant proteinuria in 3rd trimester Maternal obesity in pregnancy.   Additional problems: None.     Discharge diagnosis: Term Pregnancy Delivered                                              Post partum procedures: None.  Augmentation: AROM, Pitocin, and Cytotec Complications: None  Hospital course: Induction of Labor With Vaginal Delivery   28 y.o. yo G3P3003 at [redacted]w[redacted]d was admitted to the hospital 04/05/2023 for induction of labor.  Indication for induction: Gestational hypertension and abnormal fetal testing with a BPP of 4/8 .  Patient had a normal labor course.   Membrane Rupture Time/Date: 1:24 PM,04/06/2023  Delivery Method:Vaginal, Spontaneous Operative Delivery:N/A Episiotomy: None Lacerations:  None Details of delivery can be found in separate delivery note.  Patient had a normal postpartum course.  Patient is discharged home 04/07/23.  Newborn Data: Birth date:04/06/2023 Birth time:4:50 PM Gender:Female Living status:Living Apgars:8 ,9  Weight:3630 g  Immunizations received: Immunization History  Administered Date(s) Administered   Influenza,inj,Quad PF,6+ Mos 11/02/2021   Tdap 06/19/2016, 02/08/2017, 01/09/2020, 09/20/2021    Physical exam  Vitals:   04/06/23 1830 04/06/23 1926 04/06/23 2325 04/07/23 0540  BP: 121/69 128/73 132/77 139/75  Pulse: (!) 122 (!) 127 (!) 109 (!) 111  Resp: 18 16 16 16   Temp: 98.7 F (37.1 C) 98.4 F (36.9 C) 98.2 F (36.8 C) 98 F (36.7 C)   TempSrc: Oral Oral Oral Oral  SpO2: 100% 100% 100% 99%  Weight:      Height:      Blood pressure 139/75, pulse (!) 111, temperature 98 F (36.7 C), temperature source Oral, resp. rate 16, height 5\' 8"  (1.727 m), weight (!) 147.4 kg, last menstrual period 07/20/2022, SpO2 99%, unknown if currently breastfeeding. BMI 49 kg/m2.  General: alert, cooperative, and no distress Lochia: appropriate Uterine Fundus: firm Incision: N/A DVT Evaluation: No evidence of DVT seen on physical exam. Calf/Ankle edema is present Labs: Lab Results  Component Value Date   WBC 13.3 (H) 04/07/2023   HGB 9.6 (L) 04/07/2023   HCT 30.1 (L) 04/07/2023   MCV 86.7 04/07/2023   PLT 242 04/07/2023      Latest Ref Rng & Units 04/05/2023    2:12 PM  CMP  Glucose 70 - 99 mg/dL 956   BUN 6 - 20 mg/dL 5   Creatinine 2.13 - 0.86 mg/dL 5.78   Sodium 469 - 629 mmol/L 139   Potassium 3.5 - 5.1 mmol/L 4.0   Chloride 98 - 111 mmol/L 106   CO2 22 - 32 mmol/L 21   Calcium 8.9 - 10.3 mg/dL 9.5   Total Protein 6.5 - 8.1 g/dL 6.9   Total Bilirubin 0.0 - 1.2 mg/dL 0.3   Alkaline Phos 38 - 126 U/L 115   AST 15 - 41 U/L 15   ALT 0 - 44 U/L 13  Latest Ref Rng & Units 04/07/2023    4:30 AM 04/06/2023    6:04 PM 04/06/2023    9:12 AM  CBC  WBC 4.0 - 10.5 K/uL 13.3  12.6  10.9   Hemoglobin 12.0 - 15.0 g/dL 9.6  82.9  56.2   Hematocrit 36.0 - 46.0 % 30.1  32.1  33.1   Platelets 150 - 400 K/uL 242  260  256     Edinburgh Score:    04/07/2023    8:42 AM  Edinburgh Postnatal Depression Scale Screening Tool  I have been able to laugh and see the funny side of things. --   No data recorded  After visit meds:  Allergies as of 04/07/2023   No Known Allergies      Medication List     STOP taking these medications    aspirin EC 81 MG tablet   magnesium oxide 400 (240 Mg) MG tablet Commonly known as: MAG-OX       TAKE these medications    ibuprofen 600 MG tablet Commonly known as: ADVIL Take 1  tablet (600 mg total) by mouth every 6 (six) hours.   prenatal multivitamin Tabs tablet Take 1 tablet by mouth daily at 12 noon.       Discharge home in stable condition Infant Feeding: Breast Infant Disposition:home with mother Discharge instruction: per After Visit Summary and Postpartum booklet. Activity: Advance as tolerated. Pelvic rest for 6 weeks.  Diet: routine diet Future Appointments:No future appointments. Follow up Visit:  Follow-up Information     [facility], [facility]. Schedule an appointment as soon as possible for a visit in 1 week(s).   Specialty: Obstetrics and Gynecology Why: 1 week for blood pressure check visit. 6 weeks for postpartum check. Contact information: [address] [place] [address] [phone]                Anticipated Birth Control:  Unsure.  04/07/2023 Prescilla Sours, MD.

## 2023-04-08 NOTE — Lactation Note (Signed)
 This note was copied from a baby's chart. Lactation Consultation Note  Patient Name: Heather Durham [id number] Date: 04/08/2023 Age:27 hours Reason for consult: Follow-up assessment;Maternal discharge;Early term 37-38.6wks  P3, 37 wks, @ 40 hrs of life. Experienced breast feeder, DC anticipated today. Per mom latch going well, some times has to re-latch to adjust with baby getting a little mouth, some nipple soreness occurring. Discussed more hands on approach in first days as babies learning. Hand expression to start and breast compression through feed can assist with irritability or initial weight loss issues. Baby great @ 5% weight loss today.  Encouraged mom to keep working on big mouth latch with baby and use EBM or coconut oil- provided- after each feed. Discussed cluster feeding overnight/ early morning brings in our milk supply, shared expectations of milk coming in. Highlighted risk of engorgement. Discussed hand pump/express to soften breasts, motrin as anti-inflammatory, and ice packs for 10-20 minutes post feed/pumping if still over-full is the best treatments for inflamed/engorged breasts. Hand pump was provided to mom by previous shift, highlighted best to soften engorged breast. LC services and milk storage shared.  Maternal Data Has patient been taught Hand Expression?: Yes Does the patient have breastfeeding experience prior to this delivery?: Yes  Feeding Mother's Current Feeding Choice: Breast Milk  Lactation Tools Discussed/Used Pump Education: Milk Storage  Interventions Interventions: Breast feeding basics reviewed;Hand express;Breast compression;Expressed milk;Coconut oil;Hand pump;Education;LC Services brochure;CDC milk storage guidelines  Discharge Discharge Education: Engorgement and breast care Pump: DEBP;Manual;Personal  Consult Status Consult Status: Complete Date: 04/08/23    Idamae Lusher 04/08/2023, 9:11 AM

## 2023-04-08 NOTE — Progress Notes (Signed)
 Post Partum Day2 Subjective: no complaints, voiding and tolerating PO and breast feeding  Objective: Afebrile VSS  Physical Exam:  General: alert Lochia: appropriate Uterine Fundus: firm  DVT Evaluation: No evidence of DVT seen on physical exam.  Recent Labs    04/06/23 1804 04/07/23 0430  HGB 10.2* 9.6*  HCT 32.1* 30.1*    Assessment/Plan: PPD 2 Pt breast  feeding Fu in one week for blood pressure check Routine care   LOS: 3 days   Heather Durham A Heather Durham 04/08/2023, 3:13 PM

## 2023-04-14 ENCOUNTER — Inpatient Hospital Stay (HOSPITAL_COMMUNITY)
Admission: AD | Admit: 2023-04-14 | Discharge: 2023-04-15 | Disposition: A | Discharge status: Home / Self Care | Attending: Obstetrics and Gynecology | Admitting: Obstetrics and Gynecology

## 2023-04-14 ENCOUNTER — Other Ambulatory Visit: Payer: Self-pay

## 2023-04-14 ENCOUNTER — Encounter (HOSPITAL_COMMUNITY): Payer: Self-pay | Admitting: Obstetrics and Gynecology

## 2023-04-14 DIAGNOSIS — O9089 Other complications of the puerperium, not elsewhere classified: Secondary | ICD-10-CM | POA: Insufficient documentation

## 2023-04-14 DIAGNOSIS — O135 Gestational [pregnancy-induced] hypertension without significant proteinuria, complicating the puerperium: Secondary | ICD-10-CM | POA: Insufficient documentation

## 2023-04-14 DIAGNOSIS — R519 Headache, unspecified: Secondary | ICD-10-CM | POA: Diagnosis not present

## 2023-04-14 DIAGNOSIS — O165 Unspecified maternal hypertension, complicating the puerperium: Secondary | ICD-10-CM

## 2023-04-14 LAB — CBC
HCT: 40.5 % (ref 36.0–46.0)
Hemoglobin: 12.7 g/dL (ref 12.0–15.0)
MCH: 27.3 pg (ref 26.0–34.0)
MCHC: 31.4 g/dL (ref 30.0–36.0)
MCV: 87.1 fL (ref 80.0–100.0)
Platelets: 374 10*3/uL (ref 150–400)
RBC: 4.65 MIL/uL (ref 3.87–5.11)
RDW: 15.6 % — ABNORMAL HIGH (ref 11.5–15.5)
WBC: 10.1 10*3/uL (ref 4.0–10.5)
nRBC: 0 % (ref 0.0–0.2)

## 2023-04-14 LAB — COMPREHENSIVE METABOLIC PANEL WITH GFR
ALT: 20 U/L (ref 0–44)
AST: 19 U/L (ref 15–41)
Albumin: 2.9 g/dL — ABNORMAL LOW (ref 3.5–5.0)
Alkaline Phosphatase: 90 U/L (ref 38–126)
Anion gap: 11 (ref 5–15)
BUN: 15 mg/dL (ref 6–20)
CO2: 22 mmol/L (ref 22–32)
Calcium: 9.6 mg/dL (ref 8.9–10.3)
Chloride: 105 mmol/L (ref 98–111)
Creatinine, Ser: 0.78 mg/dL (ref 0.44–1.00)
GFR, Estimated: >60 mL/min (ref >60)
Glucose, Bld: 94 mg/dL (ref 70–99)
Potassium: 4 mmol/L (ref 3.5–5.1)
Sodium: 138 mmol/L (ref 135–145)
Total Bilirubin: 0.5 mg/dL (ref 0.0–1.2)
Total Protein: 7.3 g/dL (ref 6.5–8.1)

## 2023-04-14 LAB — URINALYSIS, ROUTINE W REFLEX MICROSCOPIC
Bilirubin Urine: NEGATIVE
Glucose, UA: NEGATIVE mg/dL
Ketones, ur: NEGATIVE mg/dL
Nitrite: NEGATIVE
Protein, ur: NEGATIVE mg/dL
Specific Gravity, Urine: 1.012 (ref 1.005–1.030)
WBC, UA: >50 WBC/hpf (ref 0–5)
pH: 6 (ref 5.0–8.0)

## 2023-04-14 LAB — PROTEIN / CREATININE RATIO, URINE
Creatinine, Urine: 54 mg/dL
Protein Creatinine Ratio: 0.26 mg/mg{creat} — ABNORMAL HIGH (ref 0.00–0.15)
Total Protein, Urine: 14 mg/dL

## 2023-04-14 MED ORDER — IBUPROFEN 600 MG PO TABS
600.0000 mg | ORAL_TABLET | Freq: Once | ORAL | Status: AC
Start: 2023-04-14 — End: 2023-04-14
  Administered 2023-04-14: 600 mg via ORAL
  Filled 2023-04-14: qty 1

## 2023-04-14 MED ORDER — ACETAMINOPHEN 500 MG PO TABS
1000.0000 mg | ORAL_TABLET | Freq: Once | ORAL | Status: AC
  Administered 2023-04-14: 1000 mg via ORAL
  Filled 2023-04-14: qty 2

## 2023-04-14 NOTE — MAU Note (Signed)
.  Heather Durham is a 28 y.o. at PPD 8 s/p SVD on 04/06/23 here in MAU reporting: headache since noon today, dizziness x2 episodes- not current, elevated BP on home monitor 132/98. Normotensive in triage today. Patient is breastfeeding, drinking 4-5 glasses of water per day in addition to body armour lites. Tried increasing hydration and eating a snack for the headache with no change. No other complaints today.   Onset of complaint: 04/14/23 at 1200 Pain score: 6/10 Vitals:   04/14/23 2115 04/14/23 2116  BP:  116/71  Pulse:  96  Resp:    Temp:    SpO2: 98%

## 2023-04-14 NOTE — MAU Provider Note (Signed)
 Chief Complaint  Patient presents with   Headache   Hypertension   Dizziness     None     S: Heather Durham  is a 28 y.o. y.o. year old G109P3003 female at [redacted]w[redacted]d weeks gestation who presents to MAU with elevated blood pressures. *** Hx HTN. Current blood pressure medication: ***  Associated symptoms: ***Headache, ***vision changes, ***epigastric pain Contractions: *** Vaginal bleeding: *** Fetal movement: ***  O: Patient Vitals for the past 24 hrs:  BP Temp Temp src Pulse Resp SpO2 Height Weight  04/14/23 2131 120/78 -- -- 92 -- -- -- --  04/14/23 2116 116/71 -- -- 96 -- -- -- --  04/14/23 2115 -- -- -- -- -- 98 % -- --  04/14/23 2111 119/75 97.7 F (36.5 C) Oral (!) 102 18 -- -- --  04/14/23 2102 -- -- -- -- -- -- 5' 8.5" (1.74 m) (!) 144.7 kg   General: NAD Heart: Regular rate Lungs: Normal rate and effort Abd: Soft, NT, Gravid, S=D Extremities: ***Pedal edema Neuro: 2+ deep tendon reflexes, No clonus Pelvic: NEFG, no bleeding or LOF.      EFM: ***, Moderate variability, 15 x 15 accelerations, no decelerations Toco: Q***, ***  No results found for this or any previous visit (from the past 24 hours).  MAU Course Orders Placed This Encounter  Procedures   CBC    Standing Status:   Standing    Number of Occurrences:   1   Comprehensive metabolic panel    Standing Status:   Standing    Number of Occurrences:   1   Protein / creatinine ratio, urine    Standing Status:   Standing    Number of Occurrences:   1   Urinalysis, Routine w reflex microscopic -Urine, Clean Catch    Standing Status:   Standing    Number of Occurrences:   1    Specimen Source:   Urine, Clean Catch [76]   Meds ordered this encounter  Medications   ibuprofen (ADVIL) tablet 600 mg   acetaminophen (TYLENOL) tablet 1,000 mg     MDM   A: [redacted]w[redacted]d week IUP *** Preeclampsia/Gestational hypertension/Chronic hypertension/chronic hypertension with superimposed preeclampsia FHR reactive  P:  Discharge home in stable condition per consult with Osborn Coho, MD./Admit to *** per consult with Osborn Coho, MD. Preeclampsia precautions. Follow-up for blood pressure check in *** days at your doctor's office sooner as needed if symptoms worsen. Return to maternity admissions as needed in emergencies  [place], [place], [place] 04/14/2023 10:38 PM

## 2023-04-15 DIAGNOSIS — R519 Headache, unspecified: Secondary | ICD-10-CM

## 2023-04-15 DIAGNOSIS — O9089 Other complications of the puerperium, not elsewhere classified: Secondary | ICD-10-CM

## 2023-04-15 MED ORDER — FUROSEMIDE 20 MG PO TABS: 20.0000 mg | ORAL_TABLET | Freq: Every day | ORAL | 0 refills | Status: AC

## 2023-04-15 MED ORDER — POTASSIUM CHLORIDE ER 10 MEQ PO TBCR: 20.0000 meq | EXTENDED_RELEASE_TABLET | Freq: Every day | ORAL | 0 refills | Status: AC

## 2023-04-18 ENCOUNTER — Telehealth (HOSPITAL_COMMUNITY): Payer: Self-pay | Admitting: *Deleted

## 2023-04-18 NOTE — Telephone Encounter (Signed)
 04/18/2023  Name: ISSABELLE MCRANEY MRN: [id number] DOB: 05/14/1995  Reason for Call:  Transition of Care Hospital Discharge Call  Contact Status: Patient Contact Status: Complete  Language assistant needed:          Follow-Up Questions: Do You Have Any Concerns About Your Health As You Heal From Delivery?: No Do You Have Any Concerns About Your Infants Health?: No  Edinburgh Postnatal Depression Scale:  In the Past 7 Days:   EPDS not done at this time. Patient scored a 0 while in the hospital, and stated, "My answers are consistent. I feel good." PHQ2-9 Depression Scale:     Discharge Follow-up: Edinburgh score requires follow up?: N/A Patient was advised of the following resources:: Breastfeeding Support Group, Support Group  Post-discharge interventions: Reviewed Newborn Safe Sleep Practices  Signature Deforest Hoyles, NR, 04/18/23, [phone]

## 2023-04-19 ENCOUNTER — Inpatient Hospital Stay (HOSPITAL_COMMUNITY)

## 2023-04-19 ENCOUNTER — Inpatient Hospital Stay (HOSPITAL_COMMUNITY): Admission: RE | Admit: 2023-04-19 | Source: Home / Self Care | Admitting: Obstetrics and Gynecology
